# Patient Record
Sex: Female | Born: 1935 | Race: White | Hispanic: No | Smoking: Never smoker
Health system: Southern US, Community
[De-identification: ages and names within clinical notes are randomized; demographics above are authoritative.]

## PROBLEM LIST (undated history)

## (undated) DIAGNOSIS — I10 Essential (primary) hypertension: Secondary | ICD-10-CM

## (undated) DIAGNOSIS — T7840XA Allergy, unspecified, initial encounter: Secondary | ICD-10-CM

## (undated) DIAGNOSIS — I4891 Unspecified atrial fibrillation: Secondary | ICD-10-CM

## (undated) DIAGNOSIS — E079 Disorder of thyroid, unspecified: Secondary | ICD-10-CM

## (undated) DIAGNOSIS — H269 Unspecified cataract: Secondary | ICD-10-CM

## (undated) DIAGNOSIS — M1990 Unspecified osteoarthritis, unspecified site: Secondary | ICD-10-CM

## (undated) DIAGNOSIS — M4800 Spinal stenosis, site unspecified: Secondary | ICD-10-CM

## (undated) DIAGNOSIS — N181 Chronic kidney disease, stage 1: Secondary | ICD-10-CM

## (undated) DIAGNOSIS — R011 Cardiac murmur, unspecified: Secondary | ICD-10-CM

## (undated) DIAGNOSIS — S42402A Unspecified fracture of lower end of left humerus, initial encounter for closed fracture: Secondary | ICD-10-CM

## (undated) DIAGNOSIS — E785 Hyperlipidemia, unspecified: Secondary | ICD-10-CM

## (undated) HISTORY — DX: Hyperlipidemia, unspecified: E78.5

## (undated) HISTORY — DX: Cardiac murmur, unspecified: R01.1

## (undated) HISTORY — DX: Allergy, unspecified, initial encounter: T78.40XA

## (undated) HISTORY — DX: Spinal stenosis, site unspecified: M48.00

## (undated) HISTORY — DX: Unspecified cataract: H26.9

## (undated) HISTORY — DX: Essential (primary) hypertension: I10

## (undated) HISTORY — DX: Chronic kidney disease, stage 1: N18.1

## (undated) HISTORY — DX: Unspecified fracture of lower end of left humerus, initial encounter for closed fracture: S42.402A

## (undated) HISTORY — DX: Unspecified atrial fibrillation: I48.91

## (undated) HISTORY — PX: BILATERAL OOPHORECTOMY: SHX1221

## (undated) HISTORY — DX: Disorder of thyroid, unspecified: E07.9

## (undated) HISTORY — DX: Unspecified osteoarthritis, unspecified site: M19.90

## (undated) HISTORY — PX: JOINT REPLACEMENT: SHX530

---

## 1967-06-04 HISTORY — PX: APPENDECTOMY: SHX54

## 1967-09-02 HISTORY — PX: OVARIAN CYST REMOVAL: SHX89

## 1977-06-03 HISTORY — PX: ABDOMINAL HYSTERECTOMY: SHX81

## 1978-06-03 HISTORY — PX: BLADDER REPAIR: SHX76

## 1998-06-03 HISTORY — PX: ROTATOR CUFF REPAIR: SHX139

## 2007-06-04 HISTORY — PX: REPLACEMENT TOTAL KNEE BILATERAL: SUR1225

## 2008-10-01 LAB — HM MAMMOGRAPHY: HM Mammogram: NORMAL

## 2008-10-03 ENCOUNTER — Ambulatory Visit: Payer: Self-pay | Admitting: Internal Medicine

## 2008-10-05 ENCOUNTER — Ambulatory Visit: Payer: Self-pay | Admitting: Internal Medicine

## 2009-03-12 ENCOUNTER — Inpatient Hospital Stay: Payer: Self-pay | Admitting: Internal Medicine

## 2009-04-10 ENCOUNTER — Ambulatory Visit: Payer: Self-pay | Admitting: Internal Medicine

## 2009-10-31 ENCOUNTER — Ambulatory Visit: Payer: Self-pay | Admitting: Internal Medicine

## 2010-08-06 ENCOUNTER — Emergency Department: Payer: Self-pay | Admitting: Emergency Medicine

## 2010-10-22 ENCOUNTER — Ambulatory Visit: Payer: Self-pay | Admitting: Internal Medicine

## 2010-11-14 ENCOUNTER — Ambulatory Visit: Payer: Self-pay | Admitting: Internal Medicine

## 2010-11-26 ENCOUNTER — Ambulatory Visit: Payer: Self-pay | Admitting: Internal Medicine

## 2011-02-08 ENCOUNTER — Telehealth: Payer: Self-pay | Admitting: Internal Medicine

## 2011-02-08 NOTE — Telephone Encounter (Signed)
Pt had 6 weeks of pt can not sit 2 more hours at a time.  Pt would like to get a note to be exzempt from jury duty

## 2011-02-11 NOTE — Telephone Encounter (Signed)
Yes,  I do not have her records available to write her the letter that she needs; but will be happy to write it. Does she have a herniated disk? Or degenerative hip disease. ?

## 2011-02-11 NOTE — Telephone Encounter (Signed)
Patient calle Ana Cruz and stated she has had 6 weeks of PT and can not sit more than 2 hours at the time.   She wanted to know if you could exempt her from jury duty.  Please advise

## 2011-02-12 NOTE — Telephone Encounter (Signed)
Notified patient letter is ready to be picked up

## 2011-02-12 NOTE — Telephone Encounter (Signed)
Patient says that she has bulging disc. She would like to pick letter up when it is ready.

## 2011-02-12 NOTE — Telephone Encounter (Signed)
Letter is printed

## 2011-04-08 ENCOUNTER — Encounter: Payer: Self-pay | Admitting: Internal Medicine

## 2011-04-09 ENCOUNTER — Encounter: Payer: Self-pay | Admitting: Internal Medicine

## 2011-04-09 ENCOUNTER — Ambulatory Visit (INDEPENDENT_AMBULATORY_CARE_PROVIDER_SITE_OTHER): Payer: Medicare Other | Admitting: Internal Medicine

## 2011-04-09 DIAGNOSIS — M48061 Spinal stenosis, lumbar region without neurogenic claudication: Secondary | ICD-10-CM

## 2011-04-09 DIAGNOSIS — M545 Low back pain, unspecified: Secondary | ICD-10-CM

## 2011-04-09 DIAGNOSIS — Z23 Encounter for immunization: Secondary | ICD-10-CM

## 2011-04-09 DIAGNOSIS — E039 Hypothyroidism, unspecified: Secondary | ICD-10-CM

## 2011-04-09 DIAGNOSIS — Z124 Encounter for screening for malignant neoplasm of cervix: Secondary | ICD-10-CM

## 2011-04-09 DIAGNOSIS — Z13820 Encounter for screening for osteoporosis: Secondary | ICD-10-CM

## 2011-04-09 DIAGNOSIS — I10 Essential (primary) hypertension: Secondary | ICD-10-CM

## 2011-04-09 DIAGNOSIS — Z79899 Other long term (current) drug therapy: Secondary | ICD-10-CM

## 2011-04-09 DIAGNOSIS — M5430 Sciatica, unspecified side: Secondary | ICD-10-CM

## 2011-04-09 DIAGNOSIS — Z1239 Encounter for other screening for malignant neoplasm of breast: Secondary | ICD-10-CM

## 2011-04-09 DIAGNOSIS — Z1231 Encounter for screening mammogram for malignant neoplasm of breast: Secondary | ICD-10-CM | POA: Insufficient documentation

## 2011-04-09 DIAGNOSIS — Z7901 Long term (current) use of anticoagulants: Secondary | ICD-10-CM

## 2011-04-09 DIAGNOSIS — E785 Hyperlipidemia, unspecified: Secondary | ICD-10-CM

## 2011-04-09 LAB — COMPREHENSIVE METABOLIC PANEL
AST: 17 U/L (ref 0–37)
Albumin: 3.8 g/dL (ref 3.5–5.2)
Alkaline Phosphatase: 82 U/L (ref 39–117)
Potassium: 4 mEq/L (ref 3.5–5.1)
Sodium: 139 mEq/L (ref 135–145)
Total Bilirubin: 0.6 mg/dL (ref 0.3–1.2)
Total Protein: 7.4 g/dL (ref 6.0–8.3)

## 2011-04-09 LAB — LIPID PANEL
LDL Cholesterol: 106 mg/dL — ABNORMAL HIGH (ref 0–99)
Total CHOL/HDL Ratio: 3
Triglycerides: 71 mg/dL (ref 0.0–149.0)
VLDL: 14.2 mg/dL (ref 0.0–40.0)

## 2011-04-09 MED ORDER — TRAMADOL HCL 50 MG PO TABS: 50.0000 mg | ORAL_TABLET | Freq: Four times a day (QID) | ORAL | Status: DC | PRN

## 2011-04-09 MED ORDER — LEVOTHYROXINE SODIUM 88 MCG PO TABS: 88.0000 ug | ORAL_TABLET | Freq: Every day | ORAL | Status: DC

## 2011-04-09 MED ORDER — PRAVASTATIN SODIUM 10 MG PO TABS: 10.0000 mg | ORAL_TABLET | Freq: Every day | ORAL | Status: DC

## 2011-04-09 MED ORDER — LOSARTAN POTASSIUM-HCTZ 100-25 MG PO TABS: 1.0000 | ORAL_TABLET | Freq: Every day | ORAL | Status: DC

## 2011-04-09 NOTE — Progress Notes (Signed)
  Subjective:    Patient ID: Ana Cruz, female    DOB: Mar 15, 1934, 77 y.o.   MRN: [id number]  HPI Ana Cruz is a 77 yr old white female with a history of hypertension, paroxysmal  atrial fib, hyperlipdiemnia who is here for 6 month follow up on chronic medical issues.. She a recent episode of RVR in May which was resolved with an oral dose of cardizem, 30 mg. She recent saw her cardiologist,  Dr. Gwen Pounds  on oct 23rd for clearance for upcoming cataract surgery in December.  Preop Nov 16 .  Had an abnormal mammogram  Recently adn returned for additional films which were normal, she thinks the left breast.  Flu shot given at [facility].  Pneumovax in 2000. Received Tdap in April 2010  Shingles in 2008.    Her low back pain has imporved with 6 weeks of PT and she is not taking tramadol on a daily basis.   Review of Systems  Constitutional: Negative for fever, chills and unexpected weight change.  HENT: Negative for hearing loss, ear pain, nosebleeds, congestion, sore throat, facial swelling, rhinorrhea, sneezing, mouth sores, trouble swallowing, neck pain, neck stiffness, voice change, postnasal drip, sinus pressure, tinnitus and ear discharge.   Eyes: Negative for pain, discharge, redness and visual disturbance.  Respiratory: Negative for cough, chest tightness, shortness of breath, wheezing and stridor.   Cardiovascular: Negative for chest pain, palpitations and leg swelling.  Musculoskeletal: Negative for myalgias and arthralgias.  Skin: Negative for color change and rash.  Neurological: Negative for dizziness, weakness, light-headedness and headaches.  Hematological: Negative for adenopathy.       Objective:   Physical Exam  Constitutional: She is oriented to person, place, and time. She appears well-developed and well-nourished.  HENT:  Mouth/Throat: Oropharynx is clear and moist.  Eyes: EOM are normal. Pupils are equal, round, and reactive to light. No scleral icterus.  Neck:  Normal range of motion. Neck supple. No JVD present. No thyromegaly present.  Cardiovascular: Normal rate, regular rhythm, normal heart sounds and intact distal pulses.   Pulmonary/Chest: Effort normal and breath sounds normal.  Abdominal: Soft. Bowel sounds are normal. She exhibits no mass. There is no tenderness.  Musculoskeletal: Normal range of motion. She exhibits no edema.  Lymphadenopathy:    She has no cervical adenopathy.  Neurological: She is alert and oriented to person, place, and time.  Skin: Skin is warm and dry.  Psychiatric: She has a normal mood and affect.          Assessment & Plan:

## 2011-04-09 NOTE — Patient Instructions (Signed)
We are getting you set up for a bone density test and referral for massage therapy for your sciatica.  We will call you with the results of your cholesterol and thyroid tests and adjust meds as needed.

## 2011-04-10 ENCOUNTER — Encounter: Payer: Self-pay | Admitting: Internal Medicine

## 2011-04-10 DIAGNOSIS — N183 Chronic kidney disease, stage 3 unspecified: Secondary | ICD-10-CM | POA: Insufficient documentation

## 2011-04-10 DIAGNOSIS — N1831 Chronic kidney disease, stage 3a: Secondary | ICD-10-CM | POA: Insufficient documentation

## 2011-04-10 DIAGNOSIS — I10 Essential (primary) hypertension: Secondary | ICD-10-CM | POA: Insufficient documentation

## 2011-04-10 DIAGNOSIS — I480 Paroxysmal atrial fibrillation: Secondary | ICD-10-CM | POA: Insufficient documentation

## 2011-04-10 DIAGNOSIS — E785 Hyperlipidemia, unspecified: Secondary | ICD-10-CM | POA: Insufficient documentation

## 2011-04-10 DIAGNOSIS — Z124 Encounter for screening for malignant neoplasm of cervix: Secondary | ICD-10-CM | POA: Insufficient documentation

## 2011-04-10 DIAGNOSIS — M48061 Spinal stenosis, lumbar region without neurogenic claudication: Secondary | ICD-10-CM | POA: Insufficient documentation

## 2011-04-10 DIAGNOSIS — D631 Anemia in chronic kidney disease: Secondary | ICD-10-CM | POA: Insufficient documentation

## 2011-04-10 DIAGNOSIS — I4891 Unspecified atrial fibrillation: Secondary | ICD-10-CM | POA: Insufficient documentation

## 2011-04-10 NOTE — Assessment & Plan Note (Signed)
Well-controlled currently 

## 2011-04-10 NOTE — Assessment & Plan Note (Signed)
Secondary to L5 disk herniation.  Managed with PT and tramadol

## 2011-04-12 ENCOUNTER — Telehealth: Payer: Self-pay | Admitting: Internal Medicine

## 2011-04-12 NOTE — Telephone Encounter (Signed)
[phone] Pt would like lab results she had done this week.  And have you send the rx to [facility] Please advise when rx are sent

## 2011-04-12 NOTE — Telephone Encounter (Signed)
Left message asking patient to return my call.

## 2011-04-16 NOTE — Telephone Encounter (Signed)
Patient has already been notified of labs results.

## 2011-04-19 ENCOUNTER — Ambulatory Visit: Payer: Self-pay | Admitting: Ophthalmology

## 2011-04-22 ENCOUNTER — Telehealth: Payer: Self-pay | Admitting: Internal Medicine

## 2011-04-22 MED ORDER — LEVOTHYROXINE SODIUM 88 MCG PO TABS: 88.0000 ug | ORAL_TABLET | Freq: Every day | ORAL | Status: DC

## 2011-04-22 NOTE — Telephone Encounter (Signed)
Patient wants the name brand of the medication synthroid to be called into the pharmacy, she can't take the generic. She also wants a bone density and massage therapy referral put in she said it should have been done at the last visit.

## 2011-04-22 NOTE — Telephone Encounter (Signed)
Both of these referrals were made at her visit one week ago and are in process, but the massage therapy has to be scheduled and paid for by the patient.  She received a call last week from Marge to this effect, but Ana Cruz is calling patient back to review  sy nthroid called to pharmacy.

## 2011-05-06 ENCOUNTER — Ambulatory Visit: Payer: Self-pay | Admitting: Ophthalmology

## 2011-05-15 ENCOUNTER — Ambulatory Visit: Payer: Self-pay | Admitting: Internal Medicine

## 2011-05-20 ENCOUNTER — Ambulatory Visit: Payer: Self-pay | Admitting: Ophthalmology

## 2011-05-22 ENCOUNTER — Telehealth: Payer: Self-pay | Admitting: Internal Medicine

## 2011-05-22 DIAGNOSIS — M8580 Other specified disorders of bone density and structure, unspecified site: Secondary | ICD-10-CM | POA: Insufficient documentation

## 2011-05-22 NOTE — Telephone Encounter (Signed)
Left message on machine at home for patient to return call. 

## 2011-05-22 NOTE — Telephone Encounter (Signed)
Received a triage note from Nurse on call OTW about UTI symptoms..  Did she get treated by Urgent Care ?  Also received her DEXA scan results and she has worsening osteopenia compared to 2010 results. .  Will discuss at next visit

## 2011-05-22 NOTE — Telephone Encounter (Signed)
Patient advised as instructed via telephone.  She went to an Urgent Care and was treated for a UTI, she finished the antibiotics yesterday and is feeling better.  Advised of dexa results.

## 2011-06-11 ENCOUNTER — Ambulatory Visit: Payer: Self-pay | Admitting: Ophthalmology

## 2011-06-17 ENCOUNTER — Encounter: Payer: Self-pay | Admitting: Internal Medicine

## 2011-07-09 ENCOUNTER — Encounter: Payer: Self-pay | Admitting: Internal Medicine

## 2011-10-07 ENCOUNTER — Encounter: Payer: Self-pay | Admitting: Internal Medicine

## 2011-10-07 ENCOUNTER — Telehealth: Payer: Self-pay | Admitting: Internal Medicine

## 2011-10-07 ENCOUNTER — Ambulatory Visit (INDEPENDENT_AMBULATORY_CARE_PROVIDER_SITE_OTHER): Payer: Medicare Other | Admitting: Internal Medicine

## 2011-10-07 VITALS — BP 124/70 | HR 63 | Temp 97.9°F | Resp 16 | Wt 167.2 lb

## 2011-10-07 DIAGNOSIS — N390 Urinary tract infection, site not specified: Secondary | ICD-10-CM | POA: Insufficient documentation

## 2011-10-07 DIAGNOSIS — M899 Disorder of bone, unspecified: Secondary | ICD-10-CM

## 2011-10-07 DIAGNOSIS — Z79899 Other long term (current) drug therapy: Secondary | ICD-10-CM

## 2011-10-07 DIAGNOSIS — M949 Disorder of cartilage, unspecified: Secondary | ICD-10-CM

## 2011-10-07 DIAGNOSIS — M8580 Other specified disorders of bone density and structure, unspecified site: Secondary | ICD-10-CM

## 2011-10-07 DIAGNOSIS — E785 Hyperlipidemia, unspecified: Secondary | ICD-10-CM

## 2011-10-07 DIAGNOSIS — E039 Hypothyroidism, unspecified: Secondary | ICD-10-CM

## 2011-10-07 DIAGNOSIS — I4891 Unspecified atrial fibrillation: Secondary | ICD-10-CM

## 2011-10-07 DIAGNOSIS — Z9849 Cataract extraction status, unspecified eye: Secondary | ICD-10-CM | POA: Insufficient documentation

## 2011-10-07 LAB — HM MAMMOGRAPHY: HM Mammogram: NORMAL

## 2011-10-07 MED ORDER — LOSARTAN POTASSIUM-HCTZ 100-25 MG PO TABS: 1.0000 | ORAL_TABLET | Freq: Every day | ORAL | Status: DC

## 2011-10-07 MED ORDER — LEVOTHYROXINE SODIUM 88 MCG PO TABS: 88.0000 ug | ORAL_TABLET | Freq: Every day | ORAL | Status: DC

## 2011-10-07 MED ORDER — PRAVASTATIN SODIUM 10 MG PO TABS: 10.0000 mg | ORAL_TABLET | Freq: Every day | ORAL | Status: DC

## 2011-10-07 MED ORDER — SULFAMETHOXAZOLE-TRIMETHOPRIM 800-160 MG PO TABS: 1.0000 | ORAL_TABLET | Freq: Two times a day (BID) | ORAL | Status: AC

## 2011-10-07 MED ORDER — ALENDRONATE SODIUM 70 MG PO TABS: 70.0000 mg | ORAL_TABLET | ORAL | Status: DC

## 2011-10-07 NOTE — Assessment & Plan Note (Signed)
Well controlled by November labs on maximal dose of pravastatin.  .  Will repeat this month

## 2011-10-07 NOTE — Progress Notes (Signed)
Patient ID: Ana Cruz, female   DOB: July 01, 1933, 78 y.o.   MRN: [id number]  Patient Active Problem List  Diagnoses  . Screening for breast cancer  . Chronic kidney disease, stage I  . Hypertension  . Atrial fibrillation  . Hyperlipidemia  . Spinal stenosis of lumbar region  . Screening for cervical cancer  . Osteopenia  . Cataract extraction status of eye  . Urinary tract infection    Subjective:  CC:   Chief Complaint  Patient presents with  . Follow-up    HPI:   Ana Cruz a 78 y.o. female who presents for 6 month follow up on hypertension, atrial fibrillation and osteopenia.  She was treated for UTI twice, once in Dec and Jan.  She is going to [place] next week for 3 weeks for her 56th wedding anniversary and worried about recurrent UTI and not sleeping on the flight.  Finally, she had a repeat DEXA scan recently and her osteopenia has worsened without treatment. And she is here to discuss therapy.   Past Medical History  Diagnosis Date  . Spinal stenosis     has L5 disk herniation with nerve root displacement  . Elbow fracture, left     repaired Jan 2000  . Atrial fibrillation   . hypothyroidism     medicated since age 45, no prior surgery  . Chronic kidney disease, stage I     mild, did improve with cessation of NSAIDs  . Hypertension   . allergic rhinitis   . Hyperlipidemia     Past Surgical History  Procedure Date  . Rotator cuff repair Jan 2000  . Ovarian cyst removal April 1969  . Bladder repair 1980  . Rotator cuff repair 2000  . Replacement total knee bilateral 2009  . Abdominal hysterectomy 1979  . Appendectomy 1969         The following portions of the patient's history were reviewed and updated as appropriate: Allergies, current medications, and problem list.    Review of Systems:   12 Pt  review of systems was negative except those addressed in the HPI,     History   Social History  . Marital Status: Married      Spouse Name: N/A    Number of Children: N/A  . Years of Education: N/A   Occupational History  . Not on file.   Social History Main Topics  . Smoking status: Never Smoker   . Smokeless tobacco: Never Used  . Alcohol Use: Yes  . Drug Use: No  . Sexually Active: Not on file   Other Topics Concern  . Not on file   Social History Narrative  . No narrative on file    Objective:  BP 124/70  Pulse 63  Temp(Src) 97.9 F (36.6 C) (Oral)  Resp 16  Wt 167 lb 4 oz (75.864 kg)  SpO2 97%  General appearance: alert, cooperative and appears stated age Ears: normal TM's and external ear canals both ears Throat: lips, mucosa, and tongue normal; teeth and gums normal Neck: no adenopathy, no carotid bruit, supple, symmetrical, trachea midline and thyroid not enlarged, symmetric, no tenderness/mass/nodules Back: symmetric, no curvature. ROM normal. No CVA tenderness. Lungs: clear to auscultation bilaterally Heart: regular rate and rhythm, S1, S2 normal, no murmur, click, rub or gallop Abdomen: soft, non-tender; bowel sounds normal; no masses,  no organomegaly Pulses: 2+ and symmetric Skin: Skin color, texture, turgor normal. No rashes or lesions Lymph nodes: Cervical, supraclavicular, and axillary  nodes normal.  Assessment and Plan:  Cataract extraction status of eye Both eyres done by Porfil[facility],  Vision improved .   Osteopenia Discussed fosomax and evista.  Decided to go with alendronate given her history of atrial fibrillation  Hyperlipidemia Well controlled by November labs on maximal dose of pravastatin.  .  Will repeat this month  Atrial fibrillation Managed with diltiazem and aspirin   Urinary tract infection She has a prlonged flight coming up ,  Will give her rx for Septra to take with her to [place]    Updated Medication List Outpatient Encounter Prescriptions as of 10/07/2011  Medication Sig Dispense Refill  . aspirin 81 MG tablet Take 81 mg by mouth daily.         . Cholecalciferol (VITAMIN D3) 1000 UNITS CAPS Take by mouth.        . clindamycin (CLEOCIN) 300 MG capsule Take 300 mg by mouth daily. Only dental visits       . diltiazem (CARDIZEM) 30 MG tablet Take 30 mg by mouth daily as needed.        [name] levothyroxine (SYNTHROID, LEVOTHROID) 88 MCG tablet Take 1 tablet (88 mcg total) by mouth daily. Brand name only  90 tablet  2  . losartan-hydrochlorothiazide (HYZAAR) 100-25 MG per tablet Take 1 tablet by mouth daily.  90 tablet  3  . niacin 500 MG CR capsule Take 500 mg by mouth at bedtime.        . pravastatin (PRAVACHOL) 10 MG tablet Take 1 tablet (10 mg total) by mouth daily.  90 tablet  3  . traMADol (ULTRAM) 50 MG tablet Take 1 tablet (50 mg total) by mouth every 6 (six) hours as needed. Maximum dose= 8 tablets per day  360 tablet  1  . DISCONTD: levothyroxine (SYNTHROID, LEVOTHROID) 88 MCG tablet Take 1 tablet (88 mcg total) by mouth daily.  90 tablet  2  . DISCONTD: levothyroxine (SYNTHROID, LEVOTHROID) 88 MCG tablet Take 1 tablet (88 mcg total) by mouth daily. Brand name only  90 tablet  2  . DISCONTD: losartan-hydrochlorothiazide (HYZAAR) 100-25 MG per tablet Take 1 tablet by mouth daily.  90 tablet  3  . DISCONTD: losartan-hydrochlorothiazide (HYZAAR) 100-25 MG per tablet Take 1 tablet by mouth daily.  90 tablet  3  . DISCONTD: pravastatin (PRAVACHOL) 10 MG tablet Take 1 tablet (10 mg total) by mouth daily.  90 tablet  3  . DISCONTD: pravastatin (PRAVACHOL) 10 MG tablet Take 1 tablet (10 mg total) by mouth daily.  90 tablet  3  . alendronate (FOSAMAX) 70 MG tablet Take 1 tablet (70 mg total) by mouth every 7 (seven) days. Take with a full glass of water on an empty stomach.  4 tablet  11  . sulfamethoxazole-trimethoprim (SEPTRA DS) 800-160 MG per tablet Take 1 tablet by mouth 2 (two) times daily.  5 tablet  0  . DISCONTD: levothyroxine (SYNTHROID) 88 MCG tablet Take 1 tablet (88 mcg total) by mouth daily.  30 tablet  11     Orders Placed This  Encounter  Procedures  . TSH  . Lipid panel  . COMPLETE METABOLIC PANEL WITH GFR    No Follow-up on file.

## 2011-10-07 NOTE — Assessment & Plan Note (Signed)
She has a prlonged flight coming up ,  Will give her rx for Septra to take with her to [place]

## 2011-10-07 NOTE — Telephone Encounter (Signed)
Patient called and stated she tried to get into MyChart but her social security number was wrong.  Can you please call patient and correct the number.  Thanks!

## 2011-10-07 NOTE — Assessment & Plan Note (Signed)
Managed with diltiazem and aspirin

## 2011-10-07 NOTE — Patient Instructions (Signed)
You may use the medicine for anxiety that I gave you earlier for your plane trip to [facility]  Your bones are getting thinner,  I am recommending that your start medication. Start the alendronate (generic for fosamax, start when you return from [place]): once a week, taken on an empty stomach , remain sitting up,  No food or nonwater beverage for 40 minutes   I am giving you a prescription for Septra in case you get a urinary tract infection on your trip

## 2011-10-07 NOTE — Assessment & Plan Note (Signed)
Both eyres done by Porfilio,  Vision improved .

## 2011-10-07 NOTE — Assessment & Plan Note (Addendum)
Discussed fosomax and evista.  Decided to go with alendronate given her history of atrial fibrillation

## 2011-10-08 NOTE — Telephone Encounter (Signed)
Correct social security number and put in correct email address Pt aware of corrections

## 2011-10-31 ENCOUNTER — Telehealth: Payer: Self-pay | Admitting: Internal Medicine

## 2011-10-31 DIAGNOSIS — Z1239 Encounter for other screening for malignant neoplasm of breast: Secondary | ICD-10-CM

## 2011-10-31 NOTE — Telephone Encounter (Signed)
Pt came in with letter from [place] stated its time for her annual screening mammogram Needs order Around 8:30  No tuesdays Not Wednesday 6/12

## 2011-12-04 ENCOUNTER — Ambulatory Visit: Payer: Self-pay | Admitting: Internal Medicine

## 2011-12-19 ENCOUNTER — Encounter: Payer: Self-pay | Admitting: Internal Medicine

## 2012-01-14 ENCOUNTER — Other Ambulatory Visit: Payer: Self-pay | Admitting: Internal Medicine

## 2012-01-14 MED ORDER — ALENDRONATE SODIUM 70 MG PO TABS: 70.0000 mg | ORAL_TABLET | ORAL | Status: DC

## 2012-04-08 ENCOUNTER — Encounter: Payer: Self-pay | Admitting: Internal Medicine

## 2012-04-08 ENCOUNTER — Ambulatory Visit (INDEPENDENT_AMBULATORY_CARE_PROVIDER_SITE_OTHER): Payer: Medicare Other | Admitting: Internal Medicine

## 2012-04-08 ENCOUNTER — Ambulatory Visit: Payer: Self-pay | Admitting: Internal Medicine

## 2012-04-08 VITALS — BP 120/68 | HR 61 | Temp 97.9°F | Resp 12 | Ht 64.0 in | Wt 172.8 lb

## 2012-04-08 DIAGNOSIS — I10 Essential (primary) hypertension: Secondary | ICD-10-CM

## 2012-04-08 DIAGNOSIS — G8929 Other chronic pain: Secondary | ICD-10-CM

## 2012-04-08 DIAGNOSIS — E039 Hypothyroidism, unspecified: Secondary | ICD-10-CM

## 2012-04-08 DIAGNOSIS — M25511 Pain in right shoulder: Secondary | ICD-10-CM

## 2012-04-08 DIAGNOSIS — Z79899 Other long term (current) drug therapy: Secondary | ICD-10-CM

## 2012-04-08 DIAGNOSIS — Z1211 Encounter for screening for malignant neoplasm of colon: Secondary | ICD-10-CM

## 2012-04-08 DIAGNOSIS — E785 Hyperlipidemia, unspecified: Secondary | ICD-10-CM

## 2012-04-08 DIAGNOSIS — M25519 Pain in unspecified shoulder: Secondary | ICD-10-CM

## 2012-04-08 DIAGNOSIS — I4891 Unspecified atrial fibrillation: Secondary | ICD-10-CM

## 2012-04-08 LAB — COMPREHENSIVE METABOLIC PANEL
ALT: 14 U/L (ref 0–35)
AST: 16 U/L (ref 0–37)
Albumin: 3.6 g/dL (ref 3.5–5.2)
Calcium: 8.8 mg/dL (ref 8.4–10.5)
Chloride: 107 mEq/L (ref 96–112)
Creatinine, Ser: 1 mg/dL (ref 0.4–1.2)
Potassium: 4.4 mEq/L (ref 3.5–5.1)

## 2012-04-08 NOTE — Patient Instructions (Addendum)
Referral to Dr Juanell Fairly at [facility] underway  We will change your thyroid medication to generic pending lab results.

## 2012-04-08 NOTE — Progress Notes (Signed)
Patient ID: Ana Cruz, female   DOB: July 18, 1933, 78 y.o.   MRN: [id number] Patient Active Problem List  Diagnosis  . Screening for breast cancer  . Chronic kidney disease, stage I  . Hypertension  . Atrial fibrillation  . Hyperlipidemia  . Spinal stenosis of lumbar region  . Screening for cervical cancer  . Osteopenia  . Cataract extraction status of eye  . Chronic left shoulder pain    Subjective:  CC:   Chief Complaint  Patient presents with  . Follow-up    stomach    HPI:   Ana Cruz a 78 y.o. female who presents 6 month follow up on atrial fibrillation, hyperlipidemia, hypothyroidism and hypertension.  She has a new complaint of right shoulder pain,  which is episodic and stabbing in nature and has been occurring for the last 3 months. She has a history of rotator cuff surgery 13 years ago requiring hardware. The pain that she is currently having is brief and stabbing and quite painful. It resolves spontaneously. It is not brought on by exercise but she has noted deep pain that limits her abduction to 180. The stabbing pain  does not occur during aerobics.  She has noted no loss of strength or fine  motor skills.  The pain does not even occur weekly.  He is not having concurrent neck pain or stiffness.  She carries her shoulder blade on the opposite side.    Past Medical History  Diagnosis Date  . Spinal stenosis     has L5 disk herniation with nerve root displacement  . Elbow fracture, left     repaired Jan 2000  . Atrial fibrillation   . hypothyroidism     medicated since age 85, no prior surgery  . Chronic kidney disease, stage I     mild, did improve with cessation of NSAIDs  . Hypertension   . allergic rhinitis   . Hyperlipidemia     Past Surgical History  Procedure Date  . Rotator cuff repair Jan 2000  . Ovarian cyst removal April 1969  . Bladder repair 1980  . Rotator cuff repair 2000  . Replacement total knee bilateral 2009  .  Abdominal hysterectomy 1979  . Appendectomy 1969  . Bilateral oophorectomy     squential         The following portions of the patient's history were reviewed and updated as appropriate: Allergies, current medications, and problem list.    Review of Systems:   12 Pt  review of systems was negative except those addressed in the HPI,     History   Social History  . Marital Status: Married    Spouse Name: N/A    Number of Children: N/A  . Years of Education: N/A   Occupational History  . Not on file.   Social History Main Topics  . Smoking status: Never Smoker   . Smokeless tobacco: Never Used  . Alcohol Use: Yes  . Drug Use: No  . Sexually Active: Not on file   Other Topics Concern  . Not on file   Social History Narrative  . No narrative on file    Objective:  BP 120/68  Pulse 61  Temp 97.9 F (36.6 C) (Oral)  Resp 12  Ht 5\' 4"  (1.626 m)  Wt 172 lb 12 oz (78.359 kg)  BMI 29.65 kg/m2  SpO2 98%  General appearance: alert, cooperative and appears stated age Neck: no adenopathy, no carotid bruit, supple, symmetrical,  trachea midline and thyroid not enlarged, symmetric, no tenderness/mass/nodules Back: symmetric, no curvature. ROM normal. No CVA tenderness. Lungs: clear to auscultation bilaterally Heart: regular rate and rhythm, S1, S2 normal, no murmur, click, rub or gallop Abdomen: soft, non-tender; bowel sounds normal; no masses,  no organomegaly Pulses: 2+ and symmetric Skin: Skin color, texture, turgor normal. No rashes or lesions Lymph nodes: Cervical, supraclavicular, and axillary nodes normal. MSK: left shoulder limited ROM to 180 of abduction .  No point tenderness,  Extremely limited internal rotation   Assessment and Plan:  Atrial fibrillation Rate controlled currently. No changes today.  Hyperlipidemia Current LDL is 104. Liver function tests are normal. Continue current statin dose.  Hypertension Well-controlled on current regimen.  Renal function is stable. No changes today.  Chronic left shoulder pain Etiology unclear. Plain films were done today to rule out fracture,  dislocation and loosening hardware. No evidence of any this was found. If her pain continues we will have to refer her for orthopedic evaluation and/or MRI.   Updated Medication List Outpatient Encounter Prescriptions as of 04/08/2012  Medication Sig Dispense Refill  . alendronate (FOSAMAX) 70 MG tablet Take 1 tablet (70 mg total) by mouth every 7 (seven) days. Take with a full glass of water on an empty stomach.  12 tablet  5  . aspirin 81 MG tablet Take 81 mg by mouth daily.        . Biotin 2500 MCG CAPS Take by mouth daily.      . clindamycin (CLEOCIN) 300 MG capsule Take 300 mg by mouth daily. Only dental visits       . diltiazem (CARDIZEM) 30 MG tablet Take 30 mg by mouth daily as needed.        [name] levothyroxine (SYNTHROID, LEVOTHROID) 88 MCG tablet Take 1 tablet (88 mcg total) by mouth daily. Brand name only  90 tablet  2  . losartan (COZAAR) 50 MG tablet Take 50 mg by mouth daily.      . niacin 500 MG CR capsule Take 500 mg by mouth at bedtime.        . pravastatin (PRAVACHOL) 10 MG tablet Take 1 tablet (10 mg total) by mouth daily.  90 tablet  3  . traMADol (ULTRAM) 50 MG tablet Take 1 tablet (50 mg total) by mouth every 6 (six) hours as needed. Maximum dose= 8 tablets per day  360 tablet  1  . [DISCONTINUED] losartan-hydrochlorothiazide (HYZAAR) 100-25 MG per tablet Take 1 tablet by mouth daily.  90 tablet  3  . Cholecalciferol (VITAMIN D3) 1000 UNITS CAPS Take by mouth.           Orders Placed This Encounter  Procedures  . DG Shoulder Right  . HM MAMMOGRAPHY  . Comprehensive metabolic panel  . TSH  . LDL cholesterol, direct  . Ambulatory referral to Orthopedic Surgery  . HM COLONOSCOPY    Return in about 3 months (around 07/09/2012).

## 2012-04-09 ENCOUNTER — Encounter: Payer: Self-pay | Admitting: Internal Medicine

## 2012-04-09 ENCOUNTER — Telehealth: Payer: Self-pay | Admitting: Internal Medicine

## 2012-04-09 DIAGNOSIS — M7531 Calcific tendinitis of right shoulder: Secondary | ICD-10-CM | POA: Insufficient documentation

## 2012-04-09 NOTE — Telephone Encounter (Signed)
Right shoulder x-rays showed no evidence of dislocation fracture or hardware that had moved. If She would like to try physical therapy or  orthopedic referral  I'm happy to refer.

## 2012-04-09 NOTE — Assessment & Plan Note (Signed)
Etiology unclear. Plain films were done today to rule out fracture dislocation and loosening hardware. No evidence of any this was found. If her pain continues we will have to refer her for orthopedic evaluation and MRI.

## 2012-04-09 NOTE — Telephone Encounter (Signed)
And her cholesterol thyroid liver and kidney function were all fine.

## 2012-04-09 NOTE — Assessment & Plan Note (Signed)
Current LDL is 104. Liver function tests are normal. Continue current statin dose.

## 2012-04-09 NOTE — Assessment & Plan Note (Signed)
Rate controlled currently. No changes today.

## 2012-04-09 NOTE — Assessment & Plan Note (Signed)
Well-controlled on current regimen. Renal function is stable. No changes today.

## 2012-04-10 NOTE — Telephone Encounter (Signed)
Spoke to patient gave her lab results as directed. And she has a referral already.

## 2012-04-15 ENCOUNTER — Other Ambulatory Visit: Payer: Self-pay | Admitting: Internal Medicine

## 2012-04-15 ENCOUNTER — Telehealth: Payer: Self-pay | Admitting: Internal Medicine

## 2012-04-15 NOTE — Telephone Encounter (Signed)
Pt called stating she need refill on thyroid meds.  Pt would like it to be generic [facility] Pt would like 90 day supply

## 2012-04-16 ENCOUNTER — Other Ambulatory Visit: Payer: Self-pay

## 2012-04-16 MED ORDER — LEVOTHYROXINE SODIUM 88 MCG PO TABS: 88.0000 ug | ORAL_TABLET | Freq: Every day | ORAL | Status: DC

## 2012-04-16 NOTE — Telephone Encounter (Signed)
Refill request for Levothyroxine 88 mg #90 2 R sent electronic to [facility]

## 2012-04-20 ENCOUNTER — Other Ambulatory Visit: Payer: Self-pay

## 2012-04-24 ENCOUNTER — Encounter: Payer: Self-pay | Admitting: Internal Medicine

## 2012-05-26 ENCOUNTER — Emergency Department: Payer: Self-pay | Admitting: Emergency Medicine

## 2012-05-26 LAB — RAPID INFLUENZA A&B ANTIGENS

## 2012-06-05 ENCOUNTER — Encounter: Payer: Self-pay | Admitting: Internal Medicine

## 2012-06-05 ENCOUNTER — Ambulatory Visit (INDEPENDENT_AMBULATORY_CARE_PROVIDER_SITE_OTHER): Payer: Medicare Other | Admitting: Internal Medicine

## 2012-06-05 ENCOUNTER — Other Ambulatory Visit: Payer: Self-pay | Admitting: Internal Medicine

## 2012-06-05 VITALS — BP 130/62 | HR 78 | Temp 98.2°F | Resp 16 | Wt 171.0 lb

## 2012-06-05 DIAGNOSIS — J329 Chronic sinusitis, unspecified: Secondary | ICD-10-CM

## 2012-06-05 DIAGNOSIS — A499 Bacterial infection, unspecified: Secondary | ICD-10-CM

## 2012-06-05 DIAGNOSIS — J019 Acute sinusitis, unspecified: Secondary | ICD-10-CM

## 2012-06-05 DIAGNOSIS — R05 Cough: Secondary | ICD-10-CM

## 2012-06-05 DIAGNOSIS — B9689 Other specified bacterial agents as the cause of diseases classified elsewhere: Secondary | ICD-10-CM

## 2012-06-05 DIAGNOSIS — R059 Cough, unspecified: Secondary | ICD-10-CM

## 2012-06-05 MED ORDER — BENZONATATE 200 MG PO CAPS: 200.0000 mg | ORAL_CAPSULE | Freq: Three times a day (TID) | ORAL | Status: DC | PRN

## 2012-06-05 MED ORDER — LEVOFLOXACIN 500 MG PO TABS: 500.0000 mg | ORAL_TABLET | Freq: Every day | ORAL | Status: DC

## 2012-06-05 MED ORDER — LEVOTHYROXINE SODIUM 88 MCG PO TABS: 88.0000 ug | ORAL_TABLET | Freq: Every day | ORAL | Status: DC

## 2012-06-05 MED ORDER — METHYLPREDNISOLONE ACETATE 40 MG/ML IJ SUSP
40.0000 mg | Freq: Once | INTRAMUSCULAR | Status: AC
  Administered 2012-06-05: 40 mg via INTRAMUSCULAR

## 2012-06-05 MED ORDER — PREDNISONE (PAK) 10 MG PO TABS: ORAL_TABLET | ORAL | Status: DC

## 2012-06-05 MED ORDER — PRAVASTATIN SODIUM 10 MG PO TABS: 10.0000 mg | ORAL_TABLET | Freq: Every day | ORAL | Status: DC

## 2012-06-05 MED ORDER — LOSARTAN POTASSIUM 50 MG PO TABS: 50.0000 mg | ORAL_TABLET | Freq: Every day | ORAL | Status: DC

## 2012-06-05 MED ORDER — ALENDRONATE SODIUM 70 MG PO TABS: 70.0000 mg | ORAL_TABLET | ORAL | Status: DC

## 2012-06-05 NOTE — Telephone Encounter (Addendum)
Refill on:   Alendronate   Pravastatin  Losartan   Levothyroxine   Paper in [facility] box pharmacy is requesting Strength, directions, quantity and refills.

## 2012-06-05 NOTE — Patient Instructions (Addendum)
You have a sinus/ear infection   .  I am prescribing an antibiotic (levaquin ) and prednisone taper  To manage the infection and the inflammation in your ear/sinuses.   Start the Levaquin tonight but start the prednisone tomorrow.  We fave you a shot of prednisone today to get you started.   I also advise use of the following OTC meds to help with your other symptoms.   Take generic OTC benadryl (dipenhydramine) 25 mg every 8 hours for the drainage,  Sudafed PE  10 to 30 mg every 8 hours for the congestion, you may substitute Afrin nasal spray for the nighttime dose of sudafed PE  If needed to prevent insomnia.(use the afrin after your simply salline flush)  flushes your sinuses twice daily with Simply Saline (do over the sink because if you do it right you will spit out globs of mucus)  Use benzonatate capsules FOR THE COUGH.  Gargle with salt water as needed for sore throat.

## 2012-06-05 NOTE — Progress Notes (Signed)
Patient ID: Ana Cruz, female   DOB: 08/20/1933, 79 y.o.   MRN: [id number]   Patient Active Problem List  Diagnosis  . Screening for breast cancer  . Chronic kidney disease, stage I  . Hypertension  . Atrial fibrillation  . Hyperlipidemia  . Spinal stenosis of lumbar region  . Screening for cervical cancer  . Osteopenia  . Cataract extraction status of eye  . Chronic left shoulder pain  . Acute sinusitis treated with antibiotics in the past 60 days    Subjective:  CC:   Chief Complaint  Patient presents with  . Cough  . Bronchitis    not improving  . Sinusitis    green mucus    HPI:   MIIA BLANKS a 79 y.o. female who presents with Signs and symptoms consistent with sinusitis and otitis.  Patient was evaluated and treated in the [place] ER on Christmas Eve (10 days ago)  for cough, congestion and dyspnea . flu swab negative, CXR was negative for infiltrates. She was treated for bronchitis with Septra, PROAir and a cough suppressant. She has finished the antibiotics but continues to have purulent blood-streaked nasal drainage accompanied by moderate pressure in both years and aching of teeth on both sides of her head. No wheezing or shortness of breath. Cough still present. She's had no recent travel. In fact her holiday plans for travel to [place] where preempted by her infection.  Her husband has had similar symptoms and was treated by urgent care.   Past Medical History  Diagnosis Date  . Spinal stenosis     has L5 disk herniation with nerve root displacement  . Elbow fracture, left     repaired Jan 2000  . Atrial fibrillation   . hypothyroidism     medicated since age 79, no prior surgery  . Chronic kidney disease, stage I     mild, did improve with cessation of NSAIDs  . Hypertension   . allergic rhinitis   . Hyperlipidemia     Past Surgical History  Procedure Date  . Rotator cuff repair Jan 2000  . Ovarian cyst removal April 1969  .  Bladder repair 1980  . Rotator cuff repair 2000  . Replacement total knee bilateral 2009  . Abdominal hysterectomy 1979  . Appendectomy 1969  . Bilateral oophorectomy     squential         The following portions of the patient's history were reviewed and updated as appropriate: Allergies, current medications, and problem list.    Review of Systems:  Patient denies  unintentional weight loss, skin rash, eye pain,  sore throat, dysphagia,  hemoptysis ,  dyspnea, wheezing, chest pain, palpitations, orthopnea, edema, abdominal pain, nausea, melena, diarrhea, constipation, flank pain, dysuria, hematuria, urinary  Frequency, nocturia, numbness, tingling, seizures,  Focal weakness, Loss of consciousness,  Tremor, insomnia, depression, anxiety, and suicidal ideation.        History   Social History  . Marital Status: Married    Spouse Name: N/A    Number of Children: N/A  . Years of Education: N/A   Occupational History  . Not on file.   Social History Main Topics  . Smoking status: Never Smoker   . Smokeless tobacco: Never Used  . Alcohol Use: Yes  . Drug Use: No  . Sexually Active: Not on file   Other Topics Concern  . Not on file   Social History Narrative  . No narrative on file  Objective:  BP 130/62  Pulse 78  Temp 98.2 F (36.8 C) (Oral)  Resp 16  Wt 171 lb (77.565 kg)  SpO2 96%  General appearance: alert, cooperative and appears stated age Ears: normal TM's and external ear canals both ears Throat: lips, mucosa, and tongue normal; teeth and gums normal Neck: no adenopathy, no carotid bruit, supple, symmetrical, trachea midline and thyroid not enlarged, symmetric, no tenderness/mass/nodules Back: symmetric, no curvature. ROM normal. No CVA tenderness. Lungs: clear to auscultation bilaterally Heart: regular rate and rhythm, S1, S2 normal, no murmur, click, rub or gallop Abdomen: soft, non-tender; bowel sounds normal; no masses,  no  organomegaly Pulses: 2+ and symmetric Skin: Skin color, texture, turgor normal. No rashes or lesions Lymph nodes: Cervical, supraclavicular, and axillary nodes normal.  Assessment and Plan:  Acute sinusitis treated with antibiotics in the past 60 days Her symptoms have failed to resolve despite treatment for community acquired MRSA with Septra by the ER physician and she has signs of early otitis.. Changing antibiotics today to Levaquin adding prednisone for inflammation, and Tessalon pearls for cough.   Updated Medication List Outpatient Encounter Prescriptions as of 06/05/2012  Medication Sig Dispense Refill  . alendronate (FOSAMAX) 70 MG tablet TAKE 1 TABLET EVERY 7 DAYS--TAKE WITH FULL GLASS OF WATER ON AN EMPTY STOMACH  12 tablet  3  . aspirin 81 MG tablet Take 81 mg by mouth daily.        . Biotin 2500 MCG CAPS Take by mouth daily.      . Cholecalciferol (VITAMIN D3) 1000 UNITS CAPS Take by mouth.        . niacin 500 MG CR capsule Take 500 mg by mouth at bedtime.        . traMADol (ULTRAM) 50 MG tablet Take 1 tablet (50 mg total) by mouth every 6 (six) hours as needed. Maximum dose= 8 tablets per day  360 tablet  1  . [DISCONTINUED] alendronate (FOSAMAX) 70 MG tablet Take 1 tablet (70 mg total) by mouth every 7 (seven) days. Take with a full glass of water on an empty stomach.  12 tablet  5  . [DISCONTINUED] levothyroxine (SYNTHROID, LEVOTHROID) 88 MCG tablet Take 1 tablet (88 mcg total) by mouth daily. Generic  name only  90 tablet  2  . [DISCONTINUED] losartan (COZAAR) 50 MG tablet Take 50 mg by mouth daily.      . [DISCONTINUED] pravastatin (PRAVACHOL) 10 MG tablet Take 1 tablet (10 mg total) by mouth daily.  90 tablet  3  . benzonatate (TESSALON) 200 MG capsule Take 1 capsule (200 mg total) by mouth 3 (three) times daily as needed for cough.  30 capsule  0  . clindamycin (CLEOCIN) 300 MG capsule Take 300 mg by mouth daily. Only dental visits       . diltiazem (CARDIZEM) 30 MG tablet  Take 30 mg by mouth daily as needed.        [name] levofloxacin (LEVAQUIN) 500 MG tablet Take 1 tablet (500 mg total) by mouth daily.  7 tablet  0  . predniSONE (STERAPRED UNI-PAK) 10 MG tablet 6 tablets on Day 1 , then reduce by 1 tablet daily until gone  21 tablet  0  . [EXPIRED] methylPREDNISolone acetate (DEPO-MEDROL) injection 40 mg          No orders of the defined types were placed in this encounter.    No Follow-up on file.

## 2012-06-05 NOTE — Telephone Encounter (Signed)
MEDICATIONS E-SCRIBED TO [facility],  ALENDRONATE, PRAVASTATIN, LOSARTAN, LEVOTHYROXINE.

## 2012-06-06 ENCOUNTER — Encounter: Payer: Self-pay | Admitting: Internal Medicine

## 2012-06-06 DIAGNOSIS — B9689 Other specified bacterial agents as the cause of diseases classified elsewhere: Secondary | ICD-10-CM | POA: Insufficient documentation

## 2012-06-06 DIAGNOSIS — J019 Acute sinusitis, unspecified: Secondary | ICD-10-CM | POA: Insufficient documentation

## 2012-06-06 NOTE — Assessment & Plan Note (Addendum)
Her symptoms have failed to resolve despite treatment for community acquired MRSA with Septra by the ER physician and she has signs of early otitis.. Changing antibiotics today to Levaquin adding prednisone for inflammation, and Tessalon pearls for cough.

## 2012-06-11 ENCOUNTER — Other Ambulatory Visit: Payer: Self-pay | Admitting: Internal Medicine

## 2012-06-11 NOTE — Telephone Encounter (Signed)
meds refaxed today.

## 2012-06-11 NOTE — Telephone Encounter (Signed)
[facility] received a new prescription for the patient's levothyroxine (SYNTHROID, LEVOTHROID) 88 MCG tablet  pravastatin (PRAVACHOL) 10 MG tablet  losartan (COZAAR) 50 MG tablet  The strength is missing .   Forms are on your desk.

## 2012-07-28 ENCOUNTER — Encounter: Payer: Self-pay | Admitting: Internal Medicine

## 2012-07-28 ENCOUNTER — Telehealth: Payer: Self-pay | Admitting: *Deleted

## 2012-07-28 ENCOUNTER — Ambulatory Visit (INDEPENDENT_AMBULATORY_CARE_PROVIDER_SITE_OTHER): Payer: Medicare Other | Admitting: Internal Medicine

## 2012-07-28 VITALS — BP 126/66 | HR 61 | Temp 97.5°F | Resp 16 | Ht 63.0 in | Wt 171.0 lb

## 2012-07-28 DIAGNOSIS — E785 Hyperlipidemia, unspecified: Secondary | ICD-10-CM

## 2012-07-28 DIAGNOSIS — E559 Vitamin D deficiency, unspecified: Secondary | ICD-10-CM

## 2012-07-28 DIAGNOSIS — E039 Hypothyroidism, unspecified: Secondary | ICD-10-CM

## 2012-07-28 DIAGNOSIS — R7989 Other specified abnormal findings of blood chemistry: Secondary | ICD-10-CM

## 2012-07-28 DIAGNOSIS — Z79899 Other long term (current) drug therapy: Secondary | ICD-10-CM

## 2012-07-28 DIAGNOSIS — I10 Essential (primary) hypertension: Secondary | ICD-10-CM

## 2012-07-28 DIAGNOSIS — M48061 Spinal stenosis, lumbar region without neurogenic claudication: Secondary | ICD-10-CM

## 2012-07-28 DIAGNOSIS — Z124 Encounter for screening for malignant neoplasm of cervix: Secondary | ICD-10-CM

## 2012-07-28 DIAGNOSIS — Z9849 Cataract extraction status, unspecified eye: Secondary | ICD-10-CM

## 2012-07-28 DIAGNOSIS — Z0000 Encounter for general adult medical examination without abnormal findings: Secondary | ICD-10-CM

## 2012-07-28 DIAGNOSIS — N181 Chronic kidney disease, stage 1: Secondary | ICD-10-CM

## 2012-07-28 DIAGNOSIS — Z1211 Encounter for screening for malignant neoplasm of colon: Secondary | ICD-10-CM

## 2012-07-28 DIAGNOSIS — N390 Urinary tract infection, site not specified: Secondary | ICD-10-CM

## 2012-07-28 LAB — POCT URINALYSIS DIPSTICK
Glucose, UA: NEGATIVE
Leukocytes, UA: NEGATIVE
Nitrite, UA: NEGATIVE
Protein, UA: NEGATIVE
Spec Grav, UA: 1.025
Urobilinogen, UA: 0.2

## 2012-07-28 LAB — COMPREHENSIVE METABOLIC PANEL
AST: 22 U/L (ref 0–37)
Albumin: 4 g/dL (ref 3.5–5.2)
BUN: 35 mg/dL — ABNORMAL HIGH (ref 6–23)
Calcium: 8.8 mg/dL (ref 8.4–10.5)
Chloride: 105 mEq/L (ref 96–112)
Creatinine, Ser: 1.3 mg/dL — ABNORMAL HIGH (ref 0.4–1.2)
GFR: 42.21 mL/min — ABNORMAL LOW (ref >60.00)
Glucose, Bld: 82 mg/dL (ref 70–99)
Potassium: 4.1 mEq/L (ref 3.5–5.1)

## 2012-07-28 LAB — TSH: TSH: 0.42 u[IU]/mL (ref 0.35–5.50)

## 2012-07-28 LAB — LIPID PANEL
Cholesterol: 213 mg/dL — ABNORMAL HIGH (ref 0–200)
VLDL: 16.6 mg/dL (ref 0.0–40.0)

## 2012-07-28 LAB — LDL CHOLESTEROL, DIRECT: Direct LDL: 118.3 mg/dL

## 2012-07-28 MED ORDER — MELOXICAM 15 MG PO TABS: 15.0000 mg | ORAL_TABLET | Freq: Every day | ORAL | Status: DC

## 2012-07-28 MED ORDER — SULFAMETHOXAZOLE-TRIMETHOPRIM 800-160 MG PO TABS: 1.0000 | ORAL_TABLET | Freq: Two times a day (BID) | ORAL | Status: DC

## 2012-07-28 NOTE — Assessment & Plan Note (Signed)
Annual comprehensive exam was done including breast, pelvic  smear. All screenings have been addressed .

## 2012-07-28 NOTE — Telephone Encounter (Signed)
No necessary,  Urinalysis was normal. Thanks!

## 2012-07-28 NOTE — Patient Instructions (Addendum)
Your pelvic exam shows that your vagina is atrophic and may be tricking you into thinking you have a UTI at times. The next time you have symptoms,  Please collect a urine and bring it to [place] BEFORE you start the antibiotic.  You can compare your results with ours.    The take home stool cards will screen for blood in your stools.   The meloxicam is for arthritis pain and can be combined with tramadol or tylenol but NOT ADVIL OR ALEVE  We will call or send you the results of your new labs  Please return in 6 months  Call when you need your mammogram ordered

## 2012-07-28 NOTE — Assessment & Plan Note (Addendum)
Well controlled on current regimen. Renal function has bumped a bit, which may be due to setpra use vs meloxicam use vs fasting state.  Will have patient return  In a week.

## 2012-07-28 NOTE — Assessment & Plan Note (Signed)
Slight bump,  May be due to resuming meloxicam

## 2012-07-28 NOTE — Assessment & Plan Note (Signed)
Well controlled on current regimen. Liver enzymes are normal, no changes today.

## 2012-07-28 NOTE — Progress Notes (Signed)
Patient ID: Ana Cruz, female   DOB: Jul 07, 1933, 79 y.o.   MRN: [id number]   The patient is here for annual Medicare wellness examination and management of other chronic and acute problems.     The risk factors are reflected in the social history.  The roster of all physicians providing medical care to patient - is listed in the Snapshot section of the chart.  Activities of daily living:  The patient is 100% independent in all ADLs: dressing, toileting, feeding as well as independent mobility  Home safety : The patient has smoke detectors in the home. They wear seatbelts.  There are no firearms at home. There is no violence in the home.   There is no risks for hepatitis, STDs or HIV. There is no   history of blood transfusion. They have no travel history to infectious disease endemic areas of the world.  The patient has seen their dentist in the last six month. They have seen their eye doctor in the last year. They admit to slight hearing difficulty with regard to whispered voices and some television programs.  They have deferred audiologic testing in the last year.  They do not  have excessive sun exposure. Discussed the need for sun protection: hats, long sleeves and use of sunscreen if there is significant sun exposure.   Diet: the importance of a healthy diet is discussed. They do have a healthy diet.  The benefits of regular aerobic exercise were discussed. She walks 4 times per week ,  20 minutes.   Depression screen: there are no signs or vegative symptoms of depression- irritability, change in appetite, anhedonia, sadness/tearfullness.  Cognitive assessment: the patient manages all their financial and personal affairs and is actively engaged. They could relate day,date,year and events; recalled 2/3 objects at 3 minutes; performed clock-face test normally.  The following portions of the patient's history were reviewed and updated as appropriate: allergies, current  medications, past family history, past medical history,  past surgical history, past social history  and problem list.  Visual acuity was not assessed per patient preference since she has regular follow up with her ophthalmologist. Hearing and body mass index were assessed and reviewed.   During the course of the visit the patient was educated and counseled about appropriate screening and preventive services including : fall prevention , diabetes screening, nutrition counseling, colorectal cancer screening, and recommended immunizations.    Objective  BP 126/66  Pulse 61  Temp(Src) 97.5 F (36.4 C) (Oral)  Resp 16  Ht 5\' 3"  (1.6 m)  Wt 171 lb (77.565 kg)  BMI 30.3 kg/m2  SpO2 97%  General Appearance:    Alert, cooperative, no distress, appears stated age  Head:    Normocephalic, without obvious abnormality, atraumatic  Eyes:    PERRL, conjunctiva/corneas clear, EOM's intact, fundi    benign, both eyes  Ears:    Normal TM's and external ear canals, both ears  Nose:   Nares normal, septum midline, mucosa normal, no drainage    or sinus tenderness  Throat:   Lips, mucosa, and tongue normal; teeth and gums normal  Neck:   Supple, symmetrical, trachea midline, no adenopathy;    thyroid:  no enlargement/tenderness/nodules; no carotid   bruit or JVD  Back:     Symmetric, no curvature, ROM normal, no CVA tenderness  Lungs:     Clear to auscultation bilaterally, respirations unlabored  Chest Wall:    No tenderness or deformity   Heart:  Regular rate and rhythm, S1 and S2 normal, no murmur, rub   or gallop  Breast Exam:    No tenderness, masses, or nipple abnormality  Abdomen:     Soft, non-tender, bowel sounds active all four quadrants,    no masses, no organomegaly  Genitalia:    Pelvic: cervix normal in appearance, external genitalia normal, no adnexal masses or tenderness, no cervical motion tenderness, rectovaginal septum normal, uterus normal size, shape, and consistency and vagina  normal without discharge  Extremities:   Extremities normal, atraumatic, no cyanosis or edema  Pulses:   2+ and symmetric all extremities  Skin:   Skin color, texture, turgor normal, no rashes or lesions  Lymph nodes:   Cervical, supraclavicular, and axillary nodes normal  Neurologic:   CNII-XII intact, normal strength, sensation and reflexes    throughout    Assessment and Plan  Routine general medical examination at a health care facility Annual comprehensive exam was done including breast, pelvic  smear. All screenings have been addressed .   Hypertension Well controlled on current regimen. Renal function has bumped a bit, which may be due to setpra use vs meloxicam use vs fasting state.  Will have patient return  In a week.   Hyperlipidemia Well controlled on current regimen. Liver enzymes are normal, no changes today.  Chronic kidney disease, stage I Slight bump,  May be due to resuming meloxicam   Updated Medication List Outpatient Encounter Prescriptions as of 07/28/2012  Medication Sig Dispense Refill  . alendronate (FOSAMAX) 70 MG tablet Take 1 tablet (70 mg total) by mouth every 7 (seven) days. Take with a full glass of water on an empty stomach.  12 tablet  5  . aspirin 81 MG tablet Take 81 mg by mouth daily.        . Biotin 2500 MCG CAPS Take by mouth daily.      . Cholecalciferol (VITAMIN D3) 1000 UNITS CAPS Take by mouth.        . diltiazem (CARDIZEM) 30 MG tablet Take 30 mg by mouth daily as needed.        [name] levothyroxine (SYNTHROID, LEVOTHROID) 88 MCG tablet Take 1 tablet (88 mcg total) by mouth daily. Generic  name only  90 tablet  2  . losartan (COZAAR) 50 MG tablet Take 1 tablet (50 mg total) by mouth daily.  90 tablet  3  . niacin 500 MG CR capsule Take 500 mg by mouth daily.       . pravastatin (PRAVACHOL) 10 MG tablet Take 1 tablet (10 mg total) by mouth daily.  90 tablet  3  . sulfamethoxazole-trimethoprim (BACTRIM DS,SEPTRA DS) 800-160 MG per tablet Take 1  tablet by mouth 2 (two) times daily.      . traMADol (ULTRAM) 50 MG tablet Take 1 tablet (50 mg total) by mouth every 6 (six) hours as needed. Maximum dose= 8 tablets per day  360 tablet  1  . clindamycin (CLEOCIN) 300 MG capsule Take 300 mg by mouth daily. Only dental visits       . meloxicam (MOBIC) 15 MG tablet Take 1 tablet (15 mg total) by mouth daily.  30 tablet  0  . sulfamethoxazole-trimethoprim (SEPTRA DS) 800-160 MG per tablet Take 1 tablet by mouth 2 (two) times daily.  14 tablet  0  . [DISCONTINUED] alendronate (FOSAMAX) 70 MG tablet TAKE 1 TABLET EVERY 7 DAYS--TAKE WITH FULL GLASS OF WATER ON AN EMPTY STOMACH  12 tablet  3  . [  DISCONTINUED] benzonatate (TESSALON) 200 MG capsule Take 1 capsule (200 mg total) by mouth 3 (three) times daily as needed for cough.  30 capsule  0  . [DISCONTINUED] levofloxacin (LEVAQUIN) 500 MG tablet Take 1 tablet (500 mg total) by mouth daily.  7 tablet  0  . [DISCONTINUED] predniSONE (STERAPRED UNI-PAK) 10 MG tablet 6 tablets on Day 1 , then reduce by 1 tablet daily until gone  21 tablet  0   No facility-administered encounter medications on file as of 07/28/2012.

## 2012-07-28 NOTE — Telephone Encounter (Signed)
Would you like a urine culture done   

## 2012-07-29 ENCOUNTER — Encounter: Payer: Self-pay | Admitting: Internal Medicine

## 2012-07-29 NOTE — Addendum Note (Signed)
Addended by: Sherlene Shams on: 07/29/2012 06:45 AM   Modules accepted: Orders

## 2012-08-11 ENCOUNTER — Other Ambulatory Visit (INDEPENDENT_AMBULATORY_CARE_PROVIDER_SITE_OTHER): Payer: Medicare Other

## 2012-08-11 ENCOUNTER — Other Ambulatory Visit: Payer: Medicare Other

## 2012-08-11 DIAGNOSIS — E785 Hyperlipidemia, unspecified: Secondary | ICD-10-CM

## 2012-08-11 DIAGNOSIS — R7989 Other specified abnormal findings of blood chemistry: Secondary | ICD-10-CM

## 2012-08-11 LAB — BASIC METABOLIC PANEL
BUN: 35 mg/dL — ABNORMAL HIGH (ref 6–23)
Calcium: 8.8 mg/dL (ref 8.4–10.5)
Chloride: 108 mEq/L (ref 96–112)
Creatinine, Ser: 1.2 mg/dL (ref 0.4–1.2)
GFR: 48.62 mL/min — ABNORMAL LOW (ref >60.00)

## 2012-08-11 LAB — LIPID PANEL
Cholesterol: 170 mg/dL (ref 0–200)
LDL Cholesterol: 89 mg/dL (ref 0–99)
Triglycerides: 61 mg/dL (ref 0.0–149.0)

## 2012-08-12 ENCOUNTER — Encounter: Payer: Self-pay | Admitting: Internal Medicine

## 2012-08-12 NOTE — Addendum Note (Signed)
Addended by: Sherlene Shams on: 08/12/2012 01:30 PM   Modules accepted: Orders

## 2012-08-19 ENCOUNTER — Other Ambulatory Visit: Payer: Self-pay | Admitting: Internal Medicine

## 2012-08-19 ENCOUNTER — Other Ambulatory Visit (INDEPENDENT_AMBULATORY_CARE_PROVIDER_SITE_OTHER): Payer: Medicare Other

## 2012-08-19 DIAGNOSIS — Z1211 Encounter for screening for malignant neoplasm of colon: Secondary | ICD-10-CM

## 2012-08-19 LAB — FECAL OCCULT BLOOD, IMMUNOCHEMICAL: Fecal Occult Bld: NEGATIVE

## 2012-08-20 ENCOUNTER — Encounter: Payer: Self-pay | Admitting: Internal Medicine

## 2012-09-08 ENCOUNTER — Encounter: Payer: Self-pay | Admitting: Internal Medicine

## 2012-09-08 ENCOUNTER — Other Ambulatory Visit (INDEPENDENT_AMBULATORY_CARE_PROVIDER_SITE_OTHER): Payer: Medicare Other

## 2012-09-08 DIAGNOSIS — R7989 Other specified abnormal findings of blood chemistry: Secondary | ICD-10-CM

## 2012-09-08 LAB — BASIC METABOLIC PANEL
BUN: 28 mg/dL — ABNORMAL HIGH (ref 6–23)
Calcium: 8.8 mg/dL (ref 8.4–10.5)
Creatinine, Ser: 1 mg/dL (ref 0.4–1.2)
GFR: 57.79 mL/min — ABNORMAL LOW (ref >60.00)

## 2012-09-09 ENCOUNTER — Encounter: Payer: Self-pay | Admitting: Internal Medicine

## 2012-09-22 ENCOUNTER — Other Ambulatory Visit: Payer: Self-pay | Admitting: Internal Medicine

## 2012-09-22 DIAGNOSIS — M48061 Spinal stenosis, lumbar region without neurogenic claudication: Secondary | ICD-10-CM

## 2012-09-22 NOTE — Telephone Encounter (Signed)
Pt requesting 90 day supply of Meloxicam to [facility]. A previous note recommended 7.5 mg, rather than the 15mg . Ok to refill, at what dose?

## 2012-09-23 MED ORDER — MELOXICAM 15 MG PO TABS: 15.0000 mg | ORAL_TABLET | Freq: Every day | ORAL | Status: DC

## 2012-11-10 ENCOUNTER — Encounter: Payer: Self-pay | Admitting: Internal Medicine

## 2012-11-17 ENCOUNTER — Other Ambulatory Visit: Payer: Self-pay | Admitting: Internal Medicine

## 2012-12-07 ENCOUNTER — Ambulatory Visit: Payer: Self-pay | Admitting: Internal Medicine

## 2012-12-16 DIAGNOSIS — Z8679 Personal history of other diseases of the circulatory system: Secondary | ICD-10-CM | POA: Insufficient documentation

## 2012-12-19 LAB — HM MAMMOGRAPHY: HM Mammogram: NORMAL

## 2012-12-25 ENCOUNTER — Encounter: Payer: Self-pay | Admitting: Internal Medicine

## 2013-01-07 DIAGNOSIS — M2021 Hallux rigidus, right foot: Secondary | ICD-10-CM | POA: Insufficient documentation

## 2013-01-19 ENCOUNTER — Telehealth: Payer: Self-pay | Admitting: Internal Medicine

## 2013-01-19 ENCOUNTER — Encounter: Payer: Self-pay | Admitting: Internal Medicine

## 2013-01-19 ENCOUNTER — Ambulatory Visit (INDEPENDENT_AMBULATORY_CARE_PROVIDER_SITE_OTHER): Payer: Medicare Other | Admitting: Internal Medicine

## 2013-01-19 VITALS — BP 150/68 | HR 61 | Temp 98.2°F | Resp 14 | Wt 176.5 lb

## 2013-01-19 DIAGNOSIS — M899 Disorder of bone, unspecified: Secondary | ICD-10-CM

## 2013-01-19 DIAGNOSIS — M8580 Other specified disorders of bone density and structure, unspecified site: Secondary | ICD-10-CM

## 2013-01-19 DIAGNOSIS — E785 Hyperlipidemia, unspecified: Secondary | ICD-10-CM

## 2013-01-19 DIAGNOSIS — Z881 Allergy status to other antibiotic agents status: Secondary | ICD-10-CM | POA: Insufficient documentation

## 2013-01-19 DIAGNOSIS — E039 Hypothyroidism, unspecified: Secondary | ICD-10-CM | POA: Insufficient documentation

## 2013-01-19 DIAGNOSIS — Z79899 Other long term (current) drug therapy: Secondary | ICD-10-CM

## 2013-01-19 DIAGNOSIS — Z888 Allergy status to other drugs, medicaments and biological substances status: Secondary | ICD-10-CM

## 2013-01-19 DIAGNOSIS — I10 Essential (primary) hypertension: Secondary | ICD-10-CM

## 2013-01-19 DIAGNOSIS — T7840XA Allergy, unspecified, initial encounter: Secondary | ICD-10-CM

## 2013-01-19 DIAGNOSIS — Z1211 Encounter for screening for malignant neoplasm of colon: Secondary | ICD-10-CM

## 2013-01-19 DIAGNOSIS — E559 Vitamin D deficiency, unspecified: Secondary | ICD-10-CM

## 2013-01-19 LAB — LIPID PANEL
Cholesterol: 214 mg/dL — ABNORMAL HIGH (ref 0–200)
HDL: 79.4 mg/dL (ref >39.00)
VLDL: 15.6 mg/dL (ref 0.0–40.0)

## 2013-01-19 LAB — COMPREHENSIVE METABOLIC PANEL
AST: 19 U/L (ref 0–37)
Alkaline Phosphatase: 68 U/L (ref 39–117)
BUN: 32 mg/dL — ABNORMAL HIGH (ref 6–23)
Calcium: 9.5 mg/dL (ref 8.4–10.5)
Creatinine, Ser: 1.2 mg/dL (ref 0.4–1.2)
Total Bilirubin: 0.6 mg/dL (ref 0.3–1.2)

## 2013-01-19 LAB — TSH: TSH: 0.87 u[IU]/mL (ref 0.35–5.50)

## 2013-01-19 MED ORDER — BLOOD PRESSURE CUFF MISC: Status: DC

## 2013-01-19 MED ORDER — AMOXICILLIN 500 MG PO CAPS: ORAL_CAPSULE | ORAL | Status: DC

## 2013-01-19 MED ORDER — METHYLPREDNISOLONE ACETATE 40 MG/ML IJ SUSP
40.0000 mg | Freq: Once | INTRAMUSCULAR | Status: AC
  Administered 2013-01-19: 40 mg via INTRAMUSCULAR

## 2013-01-19 NOTE — Assessment & Plan Note (Addendum)
By repeat DEXA testing December 2012.  Alendronate started. No symptoms of intolerance.. Continue calcium,  check vitamin D level and regular exercise.

## 2013-01-19 NOTE — Patient Instructions (Addendum)
You had an allergic reaction to clindamycin   You received a shot of Depo Medrol today to prevent any recurrence  You can use benadryl as needed for itching   Your future pre dental antibiotic will be amoxicillin 2000 mg  ( 4 500 mg tablets one hour prior to procedure)

## 2013-01-19 NOTE — Telephone Encounter (Signed)
Pt is needing rx for meter she discussed this at today's visit

## 2013-01-19 NOTE — Assessment & Plan Note (Signed)
Given her persistent symptoms after using Benadryl yesterday I have given her a shot of steroids today, 40 mg Depo-Medrol. Continue to use Benadryl as needed. Clindamycin has been added to her list of allergies.

## 2013-01-19 NOTE — Progress Notes (Signed)
Patient ID: Ana Cruz, female   DOB: 09-Mar-1934, 79 y.o.   MRN: [id number]  Patient Active Problem List   Diagnosis Date Noted  . Screening for colon cancer 01/19/2013  . Unspecified hypothyroidism 01/19/2013  . Drug allergy, antibiotic 01/19/2013  . Routine general medical examination at a health care facility 07/28/2012  . Chronic left shoulder pain 04/09/2012  . Cataract extraction status of eye 10/07/2011  . Osteopenia 05/22/2011  . Spinal stenosis of lumbar region 04/10/2011  . Screening for cervical cancer 04/10/2011  . Chronic kidney disease, stage I   . Hypertension   . Atrial fibrillation   . Hyperlipidemia   . Screening for breast cancer 04/09/2011    Subjective:  CC:   Chief Complaint  Patient presents with  . Follow-up    6 month    HPI:   Ana Cruz a 79 y.o. female who presents for 6 month Follow up on chronic conditions including hypertension osteoarthritis mild renal insufficiency and obesity. She has been checking her blood pressures at home and they have been consistently under 140 with most readings in the 120s over 70s. She's been using meloxicam every other day for management of arthritis with out GI side effects or elevations in blood pressure.  She has been having a scratchy throat and tongue swelling since yesterday when she took the clindamycin prior to dental procedure. She has bilateral knee replacements and has to use pre-procedure antibiotics and the clindamycin that she has been taking for years caused a reaction where she developed swelling of the back of the tongue and scratchy throat. It is interesting to note that the clindamycin had expired 2 years ago. Her dentist went on with this procedure gave her 50 mg of Benadryl which helped somewhat. However she continues to have symptoms today.   She's not having a trouble swallowing or breathing     Past Medical History  Diagnosis Date  . Spinal stenosis     has L5 disk  herniation with nerve root displacement  . Elbow fracture, left     repaired Jan 2000  . Atrial fibrillation   . hypothyroidism     medicated since age 85, no prior surgery  . Chronic kidney disease, stage I     mild, did improve with cessation of NSAIDs  . Hypertension   . allergic rhinitis   . Hyperlipidemia     Past Surgical History  Procedure Laterality Date  . Rotator cuff repair  Jan 2000  . Ovarian cyst removal  April 1969  . Bladder repair  1980  . Rotator cuff repair  2000  . Replacement total knee bilateral  2009  . Abdominal hysterectomy  1979  . Appendectomy  1969  . Bilateral oophorectomy      squential       The following portions of the patient's history were reviewed and updated as appropriate: Allergies, current medications, and problem list.    Review of Systems:   12 Pt  review of systems was negative except those addressed in the HPI,     History   Social History  . Marital Status: Married    Spouse Name: N/A    Number of Children: N/A  . Years of Education: N/A   Occupational History  . Not on file.   Social History Main Topics  . Smoking status: Never Smoker   . Smokeless tobacco: Never Used  . Alcohol Use: Yes  . Drug Use: No  . Sexual Activity:  Not on file   Other Topics Concern  . Not on file   Social History Narrative  . No narrative on file    Objective:  Filed Vitals:   01/19/13 0953  BP: 150/68  Pulse: 61  Temp: 98.2 F (36.8 C)  Resp: 14     General appearance: alert, cooperative and appears stated age Ears: normal TM's and external ear canals both ears Throat: lips, mucosa, and tongue normal; teeth and gums normal Neck: no adenopathy, no carotid bruit, supple, symmetrical, trachea midline and thyroid not enlarged, symmetric, no tenderness/mass/nodules Back: symmetric, no curvature. ROM normal. No CVA tenderness. Lungs: clear to auscultation bilaterally Heart: regular rate and rhythm, S1, S2 normal, no  murmur, click, rub or gallop Abdomen: soft, non-tender; bowel sounds normal; no masses,  no organomegaly Pulses: 2+ and symmetric Skin: Skin color, texture, turgor normal. No rashes or lesions Lymph nodes: Cervical, supraclavicular, and axillary nodes normal.  Assessment and Plan:  Hypertension Elevated today.  Have asked patient to recheck bp at home a minimum of 5 times over the next 4 weeks and call readings to office for adjustment of medications.    Osteopenia By repeat DEXA testing December 2012.  Alendronate started. No symptoms of intolerance.. Continue calcium,  check vitamin D level and regular exercise.  Hyperlipidemia Managed with low-dose pravastatin goal LDL less than 100.  Unspecified hypothyroidism TSH is due for repeat testing prior to refill.  Drug allergy, antibiotic Given her persistent symptoms after using Benadryl yesterday I have given her a shot of steroids today, 40 mg Depo-Medrol. Continue to use Benadryl as needed. Clindamycin has been added to her list of allergies.   Updated Medication List Outpatient Encounter Prescriptions as of 01/19/2013  Medication Sig Dispense Refill  . alendronate (FOSAMAX) 70 MG tablet Take 1 tablet (70 mg total) by mouth every 7 (seven) days. Take with a full glass of water on an empty stomach.  12 tablet  5  . aspirin 81 MG tablet Take 81 mg by mouth daily.        . Biotin 2500 MCG CAPS Take by mouth daily.      . Cholecalciferol (VITAMIN D3) 1000 UNITS CAPS Take by mouth.        . clindamycin (CLEOCIN) 300 MG capsule Take 300 mg by mouth daily. Only dental visits       . levothyroxine (SYNTHROID, LEVOTHROID) 88 MCG tablet TAKE 1 TABLET EVERY DAY  90 tablet  2  . losartan (COZAAR) 50 MG tablet Take 1 tablet (50 mg total) by mouth daily.  90 tablet  3  . meloxicam (MOBIC) 15 MG tablet Take 7.5 mg by mouth every other day.      . niacin 500 MG CR capsule Take 500 mg by mouth daily.       . pravastatin (PRAVACHOL) 10 MG tablet  Take 1 tablet (10 mg total) by mouth daily.  90 tablet  3  . traMADol (ULTRAM) 50 MG tablet Take 1 tablet (50 mg total) by mouth every 6 (six) hours as needed. Maximum dose= 8 tablets per day  360 tablet  1  . [DISCONTINUED] meloxicam (MOBIC) 15 MG tablet Take 1 tablet (15 mg total) by mouth daily.  90 tablet  3  . amoxicillin (AMOXIL) 500 MG capsule 4 tablets 1 hour prior to procedure  4 capsule  1  . Blood Pressure Monitoring (BLOOD PRESSURE CUFF) MISC Large size for [employer] Automatic BP monitoring kit  1 each  0  .  diltiazem (CARDIZEM) 30 MG tablet Take 30 mg by mouth daily as needed.        . [DISCONTINUED] sulfamethoxazole-trimethoprim (BACTRIM DS,SEPTRA DS) 800-160 MG per tablet Take 1 tablet by mouth 2 (two) times daily.      . [DISCONTINUED] sulfamethoxazole-trimethoprim (SEPTRA DS) 800-160 MG per tablet Take 1 tablet by mouth 2 (two) times daily.  14 tablet  0  . [EXPIRED] methylPREDNISolone acetate (DEPO-MEDROL) injection 40 mg        No facility-administered encounter medications on file as of 01/19/2013.     Orders Placed This Encounter  Procedures  . [facility]  . Fecal Occult Blood, Guaiac  . Comprehensive metabolic panel  . TSH  . Lipid panel  . Vit D25 hydroxy(osteoporosis monitoring)    No Follow-up on file.

## 2013-01-19 NOTE — Assessment & Plan Note (Signed)
Managed with low-dose pravastatin goal LDL less than 100.

## 2013-01-19 NOTE — Telephone Encounter (Signed)
Left message for patient to return call verify which pharmacy to send script.

## 2013-01-19 NOTE — Assessment & Plan Note (Signed)
TSH is due for repeat testing prior to refill.

## 2013-01-19 NOTE — Assessment & Plan Note (Signed)
Elevated today. Have asked patient to recheck bp at home a minimum of 5 times over the next 4 weeks and call readings to office for adjustment of medications.     

## 2013-01-20 ENCOUNTER — Encounter: Payer: Self-pay | Admitting: Internal Medicine

## 2013-01-20 MED ORDER — PRAVASTATIN SODIUM 10 MG PO TABS: 10.0000 mg | ORAL_TABLET | Freq: Every day | ORAL | Status: DC

## 2013-01-20 MED ORDER — LOSARTAN POTASSIUM 50 MG PO TABS: 50.0000 mg | ORAL_TABLET | Freq: Every day | ORAL | Status: DC

## 2013-01-20 MED ORDER — LEVOTHYROXINE SODIUM 88 MCG PO TABS: ORAL_TABLET | ORAL | Status: DC

## 2013-01-20 NOTE — Addendum Note (Signed)
Addended by: Sherlene Shams on: 01/20/2013 07:01 AM   Modules accepted: Orders

## 2013-01-21 ENCOUNTER — Other Ambulatory Visit: Payer: Self-pay | Admitting: *Deleted

## 2013-01-21 MED ORDER — DILTIAZEM HCL 30 MG PO TABS: 30.0000 mg | ORAL_TABLET | Freq: Every day | ORAL | Status: DC | PRN

## 2013-01-21 NOTE — Telephone Encounter (Signed)
Left message for patient to notify of which pharmacy she would like order sent to .

## 2013-01-21 NOTE — Telephone Encounter (Signed)
Script sent to [facility] as patient requested.

## 2013-03-26 ENCOUNTER — Encounter: Payer: Self-pay | Admitting: Internal Medicine

## 2013-03-26 DIAGNOSIS — Z79899 Other long term (current) drug therapy: Secondary | ICD-10-CM

## 2013-03-26 DIAGNOSIS — E039 Hypothyroidism, unspecified: Secondary | ICD-10-CM

## 2013-03-28 NOTE — Telephone Encounter (Signed)
No office visit needed.  Just a labs visit

## 2013-04-08 ENCOUNTER — Other Ambulatory Visit: Payer: Self-pay

## 2013-05-14 ENCOUNTER — Other Ambulatory Visit: Payer: Self-pay | Admitting: Internal Medicine

## 2013-06-05 ENCOUNTER — Other Ambulatory Visit: Payer: Self-pay | Admitting: Internal Medicine

## 2013-07-22 ENCOUNTER — Ambulatory Visit: Payer: Medicare Other | Admitting: Internal Medicine

## 2013-08-02 ENCOUNTER — Ambulatory Visit (INDEPENDENT_AMBULATORY_CARE_PROVIDER_SITE_OTHER): Payer: Medicare Other | Admitting: Internal Medicine

## 2013-08-02 ENCOUNTER — Encounter: Payer: Self-pay | Admitting: Internal Medicine

## 2013-08-02 VITALS — BP 148/64 | HR 57 | Temp 98.4°F | Resp 18 | Wt 179.5 lb

## 2013-08-02 DIAGNOSIS — Z0000 Encounter for general adult medical examination without abnormal findings: Secondary | ICD-10-CM

## 2013-08-02 DIAGNOSIS — Z23 Encounter for immunization: Secondary | ICD-10-CM

## 2013-08-02 DIAGNOSIS — R5383 Other fatigue: Principal | ICD-10-CM

## 2013-08-02 DIAGNOSIS — Z1239 Encounter for other screening for malignant neoplasm of breast: Secondary | ICD-10-CM

## 2013-08-02 DIAGNOSIS — M949 Disorder of cartilage, unspecified: Secondary | ICD-10-CM

## 2013-08-02 DIAGNOSIS — E663 Overweight: Secondary | ICD-10-CM | POA: Insufficient documentation

## 2013-08-02 DIAGNOSIS — E785 Hyperlipidemia, unspecified: Secondary | ICD-10-CM

## 2013-08-02 DIAGNOSIS — R5381 Other malaise: Secondary | ICD-10-CM

## 2013-08-02 DIAGNOSIS — Z1211 Encounter for screening for malignant neoplasm of colon: Secondary | ICD-10-CM

## 2013-08-02 DIAGNOSIS — M8580 Other specified disorders of bone density and structure, unspecified site: Secondary | ICD-10-CM

## 2013-08-02 DIAGNOSIS — E669 Obesity, unspecified: Secondary | ICD-10-CM

## 2013-08-02 DIAGNOSIS — R635 Abnormal weight gain: Secondary | ICD-10-CM

## 2013-08-02 DIAGNOSIS — E559 Vitamin D deficiency, unspecified: Secondary | ICD-10-CM

## 2013-08-02 DIAGNOSIS — M899 Disorder of bone, unspecified: Secondary | ICD-10-CM

## 2013-08-02 LAB — CBC WITH DIFFERENTIAL/PLATELET
BASOS ABS: 0 10*3/uL (ref 0.0–0.1)
Basophils Relative: 0.2 % (ref 0.0–3.0)
EOS PCT: 4.2 % (ref 0.0–5.0)
Eosinophils Absolute: 0.2 10*3/uL (ref 0.0–0.7)
HEMATOCRIT: 38.3 % (ref 36.0–46.0)
HEMOGLOBIN: 12.4 g/dL (ref 12.0–15.0)
LYMPHS ABS: 1.4 10*3/uL (ref 0.7–4.0)
Lymphocytes Relative: 24.6 % (ref 12.0–46.0)
MCHC: 32.4 g/dL (ref 30.0–36.0)
MCV: 94.6 fl (ref 78.0–100.0)
Monocytes Absolute: 0.4 10*3/uL (ref 0.1–1.0)
Monocytes Relative: 6.9 % (ref 3.0–12.0)
Neutro Abs: 3.7 10*3/uL (ref 1.4–7.7)
Neutrophils Relative %: 64.1 % (ref 43.0–77.0)
Platelets: 230 10*3/uL (ref 150.0–400.0)
RBC: 4.04 Mil/uL (ref 3.87–5.11)
RDW: 13.6 % (ref 11.5–14.6)
WBC: 5.7 10*3/uL (ref 4.5–10.5)

## 2013-08-02 LAB — COMPREHENSIVE METABOLIC PANEL
ALK PHOS: 59 U/L (ref 39–117)
ALT: 14 U/L (ref 0–35)
AST: 20 U/L (ref 0–37)
Albumin: 3.8 g/dL (ref 3.5–5.2)
BILIRUBIN TOTAL: 0.4 mg/dL (ref 0.3–1.2)
BUN: 29 mg/dL — ABNORMAL HIGH (ref 6–23)
CO2: 24 mEq/L (ref 19–32)
Calcium: 9.3 mg/dL (ref 8.4–10.5)
Chloride: 106 mEq/L (ref 96–112)
Creatinine, Ser: 1.1 mg/dL (ref 0.4–1.2)
GFR: 53.87 mL/min — ABNORMAL LOW (ref >60.00)
GLUCOSE: 86 mg/dL (ref 70–99)
Potassium: 4.6 mEq/L (ref 3.5–5.1)
SODIUM: 140 meq/L (ref 135–145)
TOTAL PROTEIN: 7.2 g/dL (ref 6.0–8.3)

## 2013-08-02 LAB — TSH: TSH: 0.83 u[IU]/mL (ref 0.35–5.50)

## 2013-08-02 LAB — LIPID PANEL
Cholesterol: 180 mg/dL (ref 0–200)
HDL: 73.9 mg/dL (ref >39.00)
LDL CALC: 92 mg/dL (ref 0–99)
Total CHOL/HDL Ratio: 2
Triglycerides: 70 mg/dL (ref 0.0–149.0)
VLDL: 14 mg/dL (ref 0.0–40.0)

## 2013-08-02 NOTE — Assessment & Plan Note (Signed)
I have addressed  BMI and recommended a low glycemic index diet utilizing smaller more frequent meals to increase metabolism.  I have also recommended that patient start exercising with a goal of 30 minutes of aerobic exercise a minimum of 5 days per week. Screening for lipid disorders, thyroid and diabetes to be done today.   

## 2013-08-02 NOTE — Progress Notes (Signed)
Pre-visit discussion using our clinic review tool. No additional management support is needed unless otherwise documented below in the visit note.  

## 2013-08-02 NOTE — Assessment & Plan Note (Signed)
Annual comprehensive exam was done including breast, excluding pelvic and PAP smear. All screenings have been addressed .  

## 2013-08-02 NOTE — Patient Instructions (Addendum)
You had your annual Medicare wellness exam today  We will schedule your mammogram and DEXA scan when they are due .  Please use the stool kit to send [place] back a sample to test for blood.  This is your colon CA screening test.   You need to have a TDaP vaccine and a Shingles vaccine.  I have given you prescriptions for thses because they will be cheaper at the health Dept or at your  local pharmacy because Medicare will not reimburse for them.   You received the new pneumonia vaccine today.  We will contact you with the bloodwork results   This is  One version of a  "Low GI"  Diet:  It will still lower your blood sugars and allow you to lose 4 to 8  lbs  per month if you follow it carefully.  Your goal with exercise is a minimum of 30 minutes of aerobic exercise 5 days per week (Walking does not count once it becomes easy!)   Your first goal is 168 lbs    All of the foods can be found at grocery stores and in bulk at [employer].  The Atkins protein bars and shakes are available in more varieties at [employer], [facility] and [place].     7 AM Breakfast:  Choose from the following:  Low carbohydrate Protein  Shakes (I recommend the EAS AdvantEdge "Carb Control" shakes  Or the low carb shakes by Atkins.    2.5 carbs   Arnold's "Sandwhich Thin"toasted  w/ peanut butter (no jelly: about 20 net carbs  "Bagel Thin" with cream cheese and salmon: about 20 carbs   a scrambled egg/bacon/cheese burrito made with Mission's "carb balance" whole wheat tortilla  (about 10 net carbs )   Avoid cereal and bananas, oatmeal and cream of wheat and grits. They are loaded with carbohydrates!   10 AM: high protein snack  Protein bar by Atkins (the snack size, under 200 cal, usually < 6 net carbs).    A stick of cheese:  Around 1 carb,  100 cal     Dannon Light n Fit [place] Yogurt  (80 cal, 8 carbs)  Other so called "protein bars" and Greek yogurts tend to be loaded with carbohydrates.  Remember, in food  advertising, the word "energy" is synonymous for " carbohydrate."  Lunch:   A Sandwich using the bread choices listed, Can use any  Eggs,  lunchmeat, grilled meat or canned tuna), avocado, regular mayo/mustard  and cheese.  A Salad using blue cheese, ranch,  Goddess or vinagrette,  No croutons or "confetti" and no "candied nuts" but regular nuts OK.   No pretzels or chips.  Pickles and miniature sweet peppers are a good low carb alternative that provide a "crunch"  The bread is the only source of carbohydrate in a sandwich and  can be decreased by trying some of these alternatives to traditional loaf bread  Joseph's makes a pita bread and a flat bread that are 50 cal and 4 net carbs available at [place] and [place].  This can be toasted to use with hummous as well  Toufayan makes a low carb flatbread that's 100 cal and 9 net carbs available at [employer] and [employer]J's makes 2 sizes of  Low carb whole wheat tortilla  (The large one is 210 cal and 6 net carbs) Avoid "Low fat dressings, as well as Byron dressings They are loaded with sugar!  3 PM/ Mid day  Snack:  Consider  1 ounce of  almonds, walnuts, pistachios, pecans, peanuts,  Macadamia nuts or a nut medley.  Avoid "granola"; the dried cranberries and raisins are loaded with carbohydrates. Mixed nuts as long as there are no raisins,  cranberries or dried fruit.     6 PM  Dinner:     Meat/fowl/fish with a green salad, and either broccoli, cauliflower, green beans, spinach, brussel sprouts or  Lima beans. DO NOT BREAD THE PROTEIN!!      There is a low carb pasta by Dreamfield's that is acceptable and tastes great: only 5 digestible carbs/serving.( All grocery stores but BJs carry it )  Try Hurley Cisco Angelo's chicken piccata or chicken or eggplant parm over low carb pasta.(Lowes and BJs)   Marjory Lies Sanchez's "Carnitas" (pulled pork, no sauce,  0 carbs) or his beef pot roast to make a dinner burrito (at [employer]J's)  Pesto over low  carb pasta (bj's sells a good quality pesto in the center refrigerated section of the deli   Whole wheat pasta is still full of digestible carbs and  Not as low in glycemic index as Dreamfield's.   Brown rice is still rice,  So skip the rice and noodles if you eat [place] or [place] (or at least limit to 1/2 cup)  9 PM snack :   Breyer's "low carb" fudgsicle or  ice cream bar (Carb Smart line), or  Weight Watcher's ice cream bar , or another "no sugar added" ice cream;  a serving of fresh berries/cherries with whipped cream   Cheese   Make a "trifle" with  DANNON'S LlGHT N FIT [place] YOGURT, fresh blueberries,  Chopped walnuts,  And whipped cream   Avoid bananas, pineapple, grapes  and watermelon on a regular basis because they are high in sugar.  THINK OF THEM AS DESSERT  Remember that snack Substitutions should be less than 10 NET carbs per serving and meals < 20 carbs. Remember to subtract fiber grams to get the "net carbs."

## 2013-08-02 NOTE — Progress Notes (Signed)
Patient ID: Ana Cruz, female   DOB: 07/14/1933, 80 y.o.   MRN: [id number]   The patient is here for annual Medicare wellness examination and management of other chronic and acute problems.    The risk factors are reflected in the social history.  The roster of all physicians providing medical care to patient - is listed in the Snapshot section of the chart.  Activities of daily living:  The patient is 100% independent in all ADLs: dressing, toileting, feeding as well as independent mobility  Home safety : The patient has smoke detectors in the home. They wear seatbelts.  There are no firearms at home. There is no violence in the home.   There is no risks for hepatitis, STDs or HIV. There is no   history of blood transfusion. They have no travel history to infectious disease endemic areas of the world.  The patient has seen their dentist in the last six month. They have seen their eye doctor in the last year. They admit to slight hearing difficulty with regard to whispered voices and some television programs.  They have deferred audiologic testing in the last year.  They do not  have excessive sun exposure. Discussed the need for sun protection: hats, long sleeves and use of sunscreen if there is significant sun exposure.   Diet: the importance of a healthy diet is discussed. They do have a healthy diet.  The benefits of regular aerobic exercise were discussed. She walks 4 times per week ,  20 minutes.   Depression screen: there are no signs or vegative symptoms of depression- irritability, change in appetite, anhedonia, sadness/tearfullness.  Cognitive assessment: the patient manages all their financial and personal affairs and is actively engaged. They could relate day,date,year and events; recalled 2/3 objects at 3 minutes; performed clock-face test normally.  The following portions of the patient's history were reviewed and updated as appropriate: allergies, current medications,  past family history, past medical history,  past surgical history, past social history  and problem list.  Visual acuity was not assessed per patient preference since she has regular follow up with her ophthalmologist. Hearing and body mass index were assessed and reviewed.   During the course of the visit the patient was educated and counseled about appropriate screening and preventive services including : fall prevention , diabetes screening, nutrition counseling, colorectal cancer screening, and recommended immunizations.    Objective: BP 148/64  Pulse 57  Temp(Src) 98.4 F (36.9 C) (Oral)  Resp 18  Wt 179 lb 8 oz (81.421 kg)  SpO2 97%  General Appearance:    Alert, cooperative, no distress, appears stated age  Head:    Normocephalic, without obvious abnormality, atraumatic  Eyes:    PERRL, conjunctiva/corneas clear, EOM's intact, fundi    benign, both eyes  Ears:    Normal TM's and external ear canals, both ears  Nose:   Nares normal, septum midline, mucosa normal, no drainage    or sinus tenderness  Throat:   Lips, mucosa, and tongue normal; teeth and gums normal  Neck:   Supple, symmetrical, trachea midline, no adenopathy;    thyroid:  no enlargement/tenderness/nodules; no carotid   bruit or JVD  Back:     Symmetric, no curvature, ROM normal, no CVA tenderness  Lungs:     Clear to auscultation bilaterally, respirations unlabored  Chest Wall:    No tenderness or deformity   Heart:    Regular rate and rhythm, S1 and S2 normal, no  murmur, rub   or gallop  Breast Exam:    No tenderness, masses, or nipple abnormality  Abdomen:     Soft, non-tender, bowel sounds active all four quadrants,    no masses, no organomegaly  Extremities:   Extremities normal, atraumatic, no cyanosis or edema  Pulses:   2+ and symmetric all extremities  Skin:   Skin color, texture, turgor normal, no rashes or lesions  Lymph nodes:   Cervical, supraclavicular, and axillary nodes normal  Neurologic:    CNII-XII intact, normal strength, sensation and reflexes    throughout    Assessment and Plan:  Obesity, unspecified I have addressed  BMI and recommended a low glycemic index diet utilizing smaller more frequent meals to increase metabolism.  I have also recommended that patient start exercising with a goal of 30 minutes of aerobic exercise a minimum of 5 days per week. Screening for lipid disorders, thyroid and diabetes to be done today.    Routine general medical examination at a health care facility Annual comprehensive exam was done including breast, excluding pelvic and PAP smear. All screenings have been addressed .    Updated Medication List Outpatient Encounter Prescriptions as of 08/02/2013  Medication Sig  . alendronate (FOSAMAX) 70 MG tablet TAKE 1 TABLET  EVERY 7  DAYS  WITH A FULL GLASS OF WATER ON AN EMPTY STOMACH  . amoxicillin (AMOXIL) 500 MG capsule 4 tablets 1 hour prior to procedure  . aspirin 81 MG tablet Take 81 mg by mouth daily.    . Biotin 2500 MCG CAPS Take by mouth daily.  . Blood Pressure Monitoring (BLOOD PRESSURE CUFF) MISC Large size for Relion Automatic BP monitoring kit  . Cholecalciferol (VITAMIN D3) 1000 UNITS CAPS Take by mouth.    . diltiazem (CARDIZEM) 30 MG tablet Take 1 tablet (30 mg total) by mouth daily as needed.  [name] levothyroxine (SYNTHROID, LEVOTHROID) 88 MCG tablet TAKE 1 TABLET EVERY DAY  . losartan (COZAAR) 50 MG tablet TAKE 1 TABLET EVERY DAY  . meloxicam (MOBIC) 15 MG tablet Take 7.5 mg by mouth every other day.  . niacin 500 MG CR capsule Take 500 mg by mouth daily.   . pravastatin (PRAVACHOL) 10 MG tablet Take 1 tablet (10 mg total) by mouth daily.  . [DISCONTINUED] clindamycin (CLEOCIN) 300 MG capsule Take 300 mg by mouth daily. Only dental visits   . [DISCONTINUED] traMADol (ULTRAM) 50 MG tablet Take 1 tablet (50 mg total) by mouth every 6 (six) hours as needed. Maximum dose= 8 tablets per day

## 2013-08-03 LAB — VITAMIN D 25 HYDROXY (VIT D DEFICIENCY, FRACTURES): VIT D 25 HYDROXY: 50 ng/mL (ref 30–89)

## 2013-08-04 ENCOUNTER — Encounter: Payer: Self-pay | Admitting: Internal Medicine

## 2013-08-09 ENCOUNTER — Encounter: Payer: Self-pay | Admitting: Internal Medicine

## 2013-08-10 ENCOUNTER — Encounter: Payer: Self-pay | Admitting: Emergency Medicine

## 2013-08-17 ENCOUNTER — Other Ambulatory Visit (INDEPENDENT_AMBULATORY_CARE_PROVIDER_SITE_OTHER): Payer: Medicare Other

## 2013-08-17 DIAGNOSIS — Z1211 Encounter for screening for malignant neoplasm of colon: Secondary | ICD-10-CM

## 2013-08-17 LAB — FECAL OCCULT BLOOD, IMMUNOCHEMICAL: FECAL OCCULT BLD: NEGATIVE

## 2013-08-18 ENCOUNTER — Encounter: Payer: Self-pay | Admitting: Internal Medicine

## 2013-11-10 ENCOUNTER — Other Ambulatory Visit: Payer: Self-pay | Admitting: Internal Medicine

## 2013-11-21 ENCOUNTER — Other Ambulatory Visit: Payer: Self-pay | Admitting: Internal Medicine

## 2014-01-11 ENCOUNTER — Encounter: Payer: Self-pay | Admitting: *Deleted

## 2014-01-11 ENCOUNTER — Ambulatory Visit: Payer: Self-pay | Admitting: Internal Medicine

## 2014-01-11 LAB — HM DEXA SCAN

## 2014-01-11 LAB — HM MAMMOGRAPHY: HM Mammogram: NEGATIVE

## 2014-01-13 ENCOUNTER — Telehealth: Payer: Self-pay | Admitting: Internal Medicine

## 2014-01-13 DIAGNOSIS — M8580 Other specified disorders of bone density and structure, unspecified site: Secondary | ICD-10-CM

## 2014-01-13 NOTE — Telephone Encounter (Signed)
T scores have worsened slightly by repeat  DEXA 2012 .    Will discuss her current  Therapy at next visit

## 2014-01-13 NOTE — Assessment & Plan Note (Signed)
T scores have worsened by repeat  DEXA 2012 Dec worst is -2.2  Will discuss her current  Therapy at next visit

## 2014-01-14 NOTE — Telephone Encounter (Signed)
Returned call to patient concerning bone density, will call back on Monday.

## 2014-01-14 NOTE — Telephone Encounter (Signed)
Left message for pt to return call.

## 2014-01-18 NOTE — Telephone Encounter (Signed)
Left message for patient to return call to office. 

## 2014-01-20 NOTE — Telephone Encounter (Signed)
Sent my chart with results. 

## 2014-02-14 ENCOUNTER — Encounter: Payer: Self-pay | Admitting: Internal Medicine

## 2014-02-14 ENCOUNTER — Ambulatory Visit (INDEPENDENT_AMBULATORY_CARE_PROVIDER_SITE_OTHER): Payer: Medicare Other | Admitting: Internal Medicine

## 2014-02-14 VITALS — BP 146/60 | HR 60 | Temp 97.8°F | Resp 14 | Ht 63.0 in | Wt 178.5 lb

## 2014-02-14 DIAGNOSIS — M8580 Other specified disorders of bone density and structure, unspecified site: Secondary | ICD-10-CM

## 2014-02-14 DIAGNOSIS — M899 Disorder of bone, unspecified: Secondary | ICD-10-CM

## 2014-02-14 DIAGNOSIS — I4891 Unspecified atrial fibrillation: Secondary | ICD-10-CM

## 2014-02-14 DIAGNOSIS — I480 Paroxysmal atrial fibrillation: Secondary | ICD-10-CM

## 2014-02-14 DIAGNOSIS — Z79899 Other long term (current) drug therapy: Secondary | ICD-10-CM

## 2014-02-14 DIAGNOSIS — E669 Obesity, unspecified: Secondary | ICD-10-CM

## 2014-02-14 DIAGNOSIS — I10 Essential (primary) hypertension: Secondary | ICD-10-CM

## 2014-02-14 DIAGNOSIS — M949 Disorder of cartilage, unspecified: Secondary | ICD-10-CM

## 2014-02-14 DIAGNOSIS — E785 Hyperlipidemia, unspecified: Secondary | ICD-10-CM

## 2014-02-14 DIAGNOSIS — N181 Chronic kidney disease, stage 1: Secondary | ICD-10-CM

## 2014-02-14 DIAGNOSIS — E039 Hypothyroidism, unspecified: Secondary | ICD-10-CM

## 2014-02-14 LAB — COMPREHENSIVE METABOLIC PANEL
ALK PHOS: 74 U/L (ref 39–117)
ALT: 15 U/L (ref 0–35)
AST: 19 U/L (ref 0–37)
Albumin: 3.7 g/dL (ref 3.5–5.2)
BILIRUBIN TOTAL: 0.4 mg/dL (ref 0.2–1.2)
BUN: 37 mg/dL — ABNORMAL HIGH (ref 6–23)
CO2: 26 mEq/L (ref 19–32)
Calcium: 9.1 mg/dL (ref 8.4–10.5)
Chloride: 106 mEq/L (ref 96–112)
Creatinine, Ser: 1.1 mg/dL (ref 0.4–1.2)
GFR: 50.45 mL/min — ABNORMAL LOW (ref >60.00)
GLUCOSE: 83 mg/dL (ref 70–99)
Potassium: 5 mEq/L (ref 3.5–5.1)
SODIUM: 139 meq/L (ref 135–145)
TOTAL PROTEIN: 7 g/dL (ref 6.0–8.3)

## 2014-02-14 LAB — CBC WITH DIFFERENTIAL/PLATELET
Basophils Absolute: 0 10*3/uL (ref 0.0–0.1)
Basophils Relative: 0.1 % (ref 0.0–3.0)
EOS PCT: 3.4 % (ref 0.0–5.0)
Eosinophils Absolute: 0.2 10*3/uL (ref 0.0–0.7)
HCT: 37.8 % (ref 36.0–46.0)
Hemoglobin: 12.5 g/dL (ref 12.0–15.0)
LYMPHS PCT: 26.2 % (ref 12.0–46.0)
Lymphs Abs: 1.8 10*3/uL (ref 0.7–4.0)
MCHC: 33 g/dL (ref 30.0–36.0)
MCV: 93.3 fl (ref 78.0–100.0)
MONOS PCT: 8.1 % (ref 3.0–12.0)
Monocytes Absolute: 0.6 10*3/uL (ref 0.1–1.0)
NEUTROS PCT: 62.2 % (ref 43.0–77.0)
Neutro Abs: 4.2 10*3/uL (ref 1.4–7.7)
PLATELETS: 245 10*3/uL (ref 150.0–400.0)
RBC: 4.05 Mil/uL (ref 3.87–5.11)
RDW: 14.1 % (ref 11.5–15.5)
WBC: 6.8 10*3/uL (ref 4.0–10.5)

## 2014-02-14 LAB — TSH: TSH: 0.84 u[IU]/mL (ref 0.35–4.50)

## 2014-02-14 LAB — LIPID PANEL
CHOL/HDL RATIO: 3
Cholesterol: 197 mg/dL (ref 0–200)
HDL: 73.7 mg/dL (ref >39.00)
LDL CALC: 112 mg/dL — AB (ref 0–99)
NONHDL: 123.3
Triglycerides: 59 mg/dL (ref 0.0–149.0)
VLDL: 11.8 mg/dL (ref 0.0–40.0)

## 2014-02-14 MED ORDER — AMOXICILLIN 500 MG PO CAPS: ORAL_CAPSULE | ORAL | Status: DC

## 2014-02-14 MED ORDER — LOSARTAN POTASSIUM 100 MG PO TABS: ORAL_TABLET | ORAL | Status: DC

## 2014-02-14 NOTE — Progress Notes (Signed)
Pre-visit discussion using our clinic review tool. No additional management support is needed unless otherwise documented below in the visit note.  

## 2014-02-14 NOTE — Assessment & Plan Note (Addendum)
Home bps have been elevated to 140s.  Complicated by PAF/atrial flutter.  Will increase losartan to 100 mg daily

## 2014-02-14 NOTE — Patient Instructions (Addendum)
I am increasing your losartan to 100 mg once daily  , you can take 2 of the 50 mg tablets daily for now.   All Bran mixed with Nature's path [place] is a good cereal combination with less carbs/more fiber  Steel cut oats ok    Read about frittata  Increase your exercise to 5 times per week and make sure you are breathing hard!!!  Your goal is 168 lbs by Thanksgiving

## 2014-02-14 NOTE — Progress Notes (Signed)
Patient ID: Ana Cruz, female   DOB: 1933/07/18, 80 y.o.   MRN: [id number]   Patient Active Problem List   Diagnosis Date Noted  . Obesity, unspecified 08/02/2013  . Encounter for preventive health examination 08/02/2013  . Screening for colon cancer 01/19/2013  . Unspecified hypothyroidism 01/19/2013  . Drug allergy, antibiotic 01/19/2013  . Routine general medical examination at a health care facility 07/28/2012  . Chronic left shoulder pain 04/09/2012  . Cataract extraction status of eye 10/07/2011  . Osteopenia 05/22/2011  . Spinal stenosis of lumbar region 04/10/2011  . Screening for cervical cancer 04/10/2011  . Chronic kidney disease, stage I   . Hypertension   . SVT (supraventricular tachycardia)   . Hyperlipidemia   . Screening for breast cancer 04/09/2011    Subjective:  CC:   Chief Complaint  Patient presents with  . Follow-up    6 month patient is fasting  . Hypothyroidism  . Hypertension  . Hyperlipidemia    HPI:   Ana Cruz is a 80 y.o. female who presents for follow up on hypertension, hyperlipidemia, hypothyroidism and obesity.  Her previous successful wt loss has stopped and she has gained back much of her weight,  She is upset by this and blames her husband's recent fall resulting in a fractured arm, requiring more attention to his needs than hers and a temporary suspension of diet and exercise regimen.  Home BPS have been > 140/80 consistently .  Episodes of SVT have been infrequent, managed with diltiazem.  Cardiology checkup in June was uneventful.    Past Medical History  Diagnosis Date  . Spinal stenosis     has L5 disk herniation with nerve root displacement  . Elbow fracture, left     repaired Jan 2000  . Atrial fibrillation   . hypothyroidism     medicated since age 31, no prior surgery  . Chronic kidney disease, stage I     mild, did improve with cessation of NSAIDs  . Hypertension   . allergic rhinitis   .  Hyperlipidemia     Past Surgical History  Procedure Laterality Date  . Rotator cuff repair  Jan 2000  . Ovarian cyst removal  April 1969  . Bladder repair  1980  . Rotator cuff repair  2000  . Replacement total knee bilateral  2009  . Abdominal hysterectomy  1979  . Appendectomy  1969  . Bilateral oophorectomy      squential       The following portions of the patient's history were reviewed and updated as appropriate: Allergies, current medications, and problem list.    Review of Systems:   Patient denies headache, fevers, malaise, unintentional weight loss, skin rash, eye pain, sinus congestion and sinus pain, sore throat, dysphagia,  hemoptysis , cough, dyspnea, wheezing, chest pain, palpitations, orthopnea, edema, abdominal pain, nausea, melena, diarrhea, constipation, flank pain, dysuria, hematuria, urinary  Frequency, nocturia, numbness, tingling, seizures,  Focal weakness, Loss of consciousness,  Tremor, insomnia, depression, anxiety, and suicidal ideation.     History   Social History  . Marital Status: Married    Spouse Name: N/A    Number of Children: N/A  . Years of Education: N/A   Occupational History  . Not on file.   Social History Main Topics  . Smoking status: Never Smoker   . Smokeless tobacco: Never Used  . Alcohol Use: Yes  . Drug Use: No  . Sexual Activity: Not on file  Other Topics Concern  . Not on file   Social History Narrative  . No narrative on file    Objective:  Filed Vitals:   02/14/14 0839  BP: 146/60  Pulse: 60  Temp: 97.8 F (36.6 C)  Resp: 14     General appearance: alert, cooperative and appears stated age Ears: normal TM's and external ear canals both ears Throat: lips, mucosa, and tongue normal; teeth and gums normal Neck: no adenopathy, no carotid bruit, supple, symmetrical, trachea midline and thyroid not enlarged, symmetric, no tenderness/mass/nodules Back: symmetric, no curvature. ROM normal. No CVA  tenderness. Lungs: clear to auscultation bilaterally Heart: regular rate and rhythm, S1, S2 normal, no murmur, click, rub or gallop Abdomen: soft, non-tender; bowel sounds normal; no masses,  no organomegaly Pulses: 2+ and symmetric Skin: Skin color, texture, turgor normal. No rashes or lesions Lymph nodes: Cervical, supraclavicular, and axillary nodes normal.  Assessment and Plan:  Osteopenia discusse  Hypertension Home bps have been elevated to 140s.  Complicated by PAF/atrial flutter.  Will increase losartan to 100 mg daily     Unspecified hypothyroidism Thyroid function is WNL on current dose.  No current changes needed.   Lab Results  Component Value Date   TSH 0.84 02/14/2014     Hyperlipidemia LDL is not at goal on current pravastatin dose,  Will increase to 20 mg daily .  She has no side effects and liver enzymes are normal. No changes today.  Lab Results  Component Value Date   CHOL 197 02/14/2014   HDL 73.70 02/14/2014   LDLCALC 112* 02/14/2014   LDLDIRECT 121.3 01/19/2013   TRIG 59.0 02/14/2014   CHOLHDL 3 02/14/2014   Lab Results  Component Value Date   ALT 15 02/14/2014   AST 19 02/14/2014   ALKPHOS 74 02/14/2014   BILITOT 0.4 02/14/2014      Chronic kidney disease, stage I Renal function has returned to baseline with avoidance of NSAIDs.  She is on an ARB for control of hypertension,, and statin for control of hyperlipidemia.   SVT (supraventricular tachycardia) She appears to be in sinus rhythm currently but has infrequent episodes of rapid HR which she is treating with prn diltiazem   Obesity, unspecified She has gained weight due to relative inactivity resutling from husband's convalescence from arm injury.  I have addressed BMI and recommended wt loss of 10% of body weight over the next 6 months using a low glycemic index diet and regular exercise a minimum of 5 days per week. Initial Goal is to reduce weight to 168 lbs by  November 25th     A total of  40 minutes was spent with patient more than half of which was spent in counseling patient on the above mentioned issues , reviewing and explaining recent labs , and coordination of care.   Updated Medication List Outpatient Encounter Prescriptions as of 02/14/2014  Medication Sig  . alendronate (FOSAMAX) 70 MG tablet TAKE 1 TABLET  EVERY 7  DAYS  WITH A FULL GLASS OF WATER ON AN EMPTY STOMACH  . amoxicillin (AMOXIL) 500 MG capsule 4 tablets 1 hour prior to procedure  . aspirin 81 MG tablet Take 81 mg by mouth daily.    . Cholecalciferol (VITAMIN D3) 1000 UNITS CAPS Take by mouth.    . diltiazem (CARDIZEM) 30 MG tablet Take 1 tablet (30 mg total) by mouth daily as needed.  [name] levothyroxine (SYNTHROID, LEVOTHROID) 88 MCG tablet TAKE 1 TABLET EVERY DAY  .  losartan (COZAAR) 100 MG tablet TAKE 1 TABLET EVERY DAY  . meloxicam (MOBIC) 15 MG tablet Take 7.5 mg by mouth every other day.  . pravastatin (PRAVACHOL) 20 MG tablet TAKE 1 TABLET EVERY DAY  . [DISCONTINUED] amoxicillin (AMOXIL) 500 MG capsule 4 tablets 1 hour prior to procedure  . [DISCONTINUED] Biotin 2500 MCG CAPS Take by mouth daily.  . [DISCONTINUED] Blood Pressure Monitoring (BLOOD PRESSURE CUFF) MISC Large size for [employer] Automatic BP monitoring kit  . [DISCONTINUED] levothyroxine (SYNTHROID, LEVOTHROID) 88 MCG tablet TAKE 1 TABLET EVERY DAY  . [DISCONTINUED] losartan (COZAAR) 50 MG tablet TAKE 1 TABLET EVERY DAY  . [DISCONTINUED] niacin 500 MG CR capsule Take 500 mg by mouth daily.   . [DISCONTINUED] pravastatin (PRAVACHOL) 10 MG tablet TAKE 1 TABLET EVERY DAY     Orders Placed This Encounter  Procedures  . Comprehensive metabolic panel  . TSH  . Lipid panel  . CBC with Differential    No Follow-up on file.

## 2014-02-14 NOTE — Assessment & Plan Note (Signed)
discusse

## 2014-02-15 ENCOUNTER — Encounter: Payer: Self-pay | Admitting: Internal Medicine

## 2014-02-15 MED ORDER — LEVOTHYROXINE SODIUM 88 MCG PO TABS: ORAL_TABLET | ORAL | Status: DC

## 2014-02-15 MED ORDER — PRAVASTATIN SODIUM 20 MG PO TABS: ORAL_TABLET | ORAL | Status: DC

## 2014-02-15 NOTE — Assessment & Plan Note (Addendum)
She appears to be in sinus rhythm currently but has infrequent episodes of rapid HR which she is treating with prn diltiazem

## 2014-02-15 NOTE — Assessment & Plan Note (Signed)
Renal function has returned to baseline with avoidance of NSAIDs.  She is on an ARB for control of hypertension,, and statin for control of hyperlipidemia.

## 2014-02-15 NOTE — Assessment & Plan Note (Signed)
She has gained weight due to relative inactivity resutling from husband's convalescence from arm injury.  I have addressed BMI and recommended wt loss of 10% of body weight over the next 6 months using a low glycemic index diet and regular exercise a minimum of 5 days per week. Initial Goal is to reduce weight to 168 lbs by  November 25th

## 2014-02-15 NOTE — Assessment & Plan Note (Signed)
LDL is not at goal on current pravastatin dose,  Will increase to 20 mg daily .  She has no side effects and liver enzymes are normal. No changes today.  Lab Results  Component Value Date   CHOL 197 02/14/2014   HDL 73.70 02/14/2014   LDLCALC 112* 02/14/2014   LDLDIRECT 121.3 01/19/2013   TRIG 59.0 02/14/2014   CHOLHDL 3 02/14/2014   Lab Results  Component Value Date   ALT 15 02/14/2014   AST 19 02/14/2014   ALKPHOS 74 02/14/2014   BILITOT 0.4 02/14/2014

## 2014-02-15 NOTE — Assessment & Plan Note (Signed)
Thyroid function is WNL on current dose.  No current changes needed.   Lab Results  Component Value Date   TSH 0.84 02/14/2014

## 2014-04-02 ENCOUNTER — Encounter: Payer: Self-pay | Admitting: Internal Medicine

## 2014-05-11 ENCOUNTER — Ambulatory Visit (INDEPENDENT_AMBULATORY_CARE_PROVIDER_SITE_OTHER): Payer: Medicare Other | Admitting: Nurse Practitioner

## 2014-05-11 ENCOUNTER — Encounter: Payer: Self-pay | Admitting: Nurse Practitioner

## 2014-05-11 VITALS — BP 140/60 | HR 67 | Temp 98.7°F | Resp 14 | Ht 63.0 in | Wt 180.5 lb

## 2014-05-11 DIAGNOSIS — M48061 Spinal stenosis, lumbar region without neurogenic claudication: Secondary | ICD-10-CM | POA: Insufficient documentation

## 2014-05-11 DIAGNOSIS — M545 Low back pain, unspecified: Secondary | ICD-10-CM

## 2014-05-11 DIAGNOSIS — M5416 Radiculopathy, lumbar region: Secondary | ICD-10-CM

## 2014-05-11 MED ORDER — METHYLPREDNISOLONE (PAK) 4 MG PO TABS: ORAL_TABLET | ORAL | Status: DC

## 2014-05-11 NOTE — Patient Instructions (Signed)
Keep doing the water exercise classes to keep strength and alleviate pain.   Follow the instructions on the Prednisone pack.   Call us if pain worsens, changes, goes down a leg, or fails to improve.

## 2014-05-11 NOTE — Progress Notes (Signed)
Pre visit review using our clinic review tool, if applicable. No additional management support is needed unless otherwise documented below in the visit note. 

## 2014-05-11 NOTE — Progress Notes (Signed)
Subjective:    Patient ID: Ana Cruz, female    DOB: 26-Apr-1934, 80 y.o.   MRN: [id number]  HPI  Ana Cruz is a 80 yo female here for Back pain x 3 weeks.  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 Felt the pain and pull of the right lower back into Right buttock no radiation, denies weakness, or loss of bowel/bladder.  Walking aggrevates, 6/10 stays constant, still going Mondays and Wednesdays to the pool and states this helps.  Heat- 15 min on, Did not help Cold- 15 min on , did not help Extra strength tylenol  PT in past at [facility]- helpful  Review of Systems  Cardiovascular: Negative for leg swelling.  Musculoskeletal: Positive for back pain, arthralgias and gait problem. Negative for joint swelling.       Right lower back, Using cane or walks slowly due to pain, bilateral knee replacements 2009  Skin: Negative for color change and rash.  Neurological: Negative for dizziness, weakness and headaches.  Psychiatric/Behavioral: The patient is not nervous/anxious.    Past Medical History  Diagnosis Date  . Spinal stenosis     has L5 disk herniation with nerve root displacement  . Elbow fracture, left     repaired Jan 2000  . Atrial fibrillation   . hypothyroidism     medicated since age 12, no prior surgery  . Chronic kidney disease, stage I     mild, did improve with cessation of NSAIDs  . Hypertension   . allergic rhinitis   . Hyperlipidemia     History   Social History  . Marital Status: Married    Spouse Name: N/A    Number of Children: N/A  . Years of Education: N/A   Occupational History  . Not on file.   Social History Main Topics  . Smoking status: Never Smoker   . Smokeless tobacco: Never Used  . Alcohol Use: Yes  . Drug Use: No  . Sexual Activity: Not on file   Other Topics Concern  . Not on file   Social History Narrative    Past Surgical History  Procedure Laterality Date  .  Rotator cuff repair  Jan 2000  . Ovarian cyst removal  April 1969  . Bladder repair  1980  . Rotator cuff repair  2000  . Replacement total knee bilateral  2009  . Abdominal hysterectomy  1979  . Appendectomy  1969  . Bilateral oophorectomy      squential    Family History  Problem Relation Age of Onset  . Hypertension Mother   . Heart disease Father 53    MI  . Cancer Paternal Grandfather 60    colon    Allergies  Allergen Reactions  . Chocolate Flavor Hives  . Clindamycin/Lincomycin     Tongue swelled,  Throat felt funny    Current Outpatient Prescriptions on File Prior to Visit  Medication Sig Dispense Refill  . alendronate (FOSAMAX) 70 MG tablet TAKE 1 TABLET  EVERY 7  DAYS  WITH A FULL GLASS OF WATER ON AN EMPTY STOMACH 12 tablet 3  . amoxicillin (AMOXIL) 500 MG capsule 4 tablets 1 hour prior to procedure 4 capsule 3  . aspirin 81 MG tablet Take 81 mg by mouth daily.      . Cholecalciferol (VITAMIN D3) 1000 UNITS CAPS Take by mouth.      . diltiazem (CARDIZEM) 30 MG tablet Take 1 tablet (30  mg total) by mouth daily as needed. 30 tablet 4  . levothyroxine (SYNTHROID, LEVOTHROID) 88 MCG tablet TAKE 1 TABLET EVERY DAY 90 tablet 2  . losartan (COZAAR) 100 MG tablet TAKE 1 TABLET EVERY DAY 90 tablet 1  . meloxicam (MOBIC) 15 MG tablet Take 7.5 mg by mouth every other day.     No current facility-administered medications on file prior to visit.       Objective:   Physical Exam  Constitutional: She is oriented to person, place, and time.  Musculoskeletal: She exhibits tenderness.  Tender to palpation at lower right back and buttock. Midspine and left paraspinal muscles non-tender  Neurological: She is alert and oriented to person, place, and time. She displays abnormal reflex. No cranial nerve deficit. She exhibits normal muscle tone. Coordination abnormal.  Trouble getting left patellar reflex due to TKA. Right knee was 2+. Gait is slow and she is careful with right  side. Strength 5/5 in lower extremities and EHLs bilaterally. Can take a step on toes, heels, and tangential gait.   Skin: Skin is warm and dry. No rash noted. No erythema.  Psychiatric: She has a normal mood and affect. Her behavior is normal. Judgment and thought content normal.      BP 140/60 mmHg  Pulse 67  Temp(Src) 98.7 F (37.1 C) (Oral)  Resp 14  Ht 5\' 3"  (1.6 m)  Wt 180 lb 8 oz (81.874 kg)  BMI 31.98 kg/m2  SpO2 97%     Assessment & Plan:

## 2014-05-11 NOTE — Assessment & Plan Note (Signed)
Unresolved. Pt has stenosis of lumbar spine. This is new onset and believed to be muscular in nature. There is no radiation of pain. Rx for prednisone taper, continue mobic, new referral to PT at [facility]. Discussed that it is important to stay active and swimming will be beneficial. Discussed calling us if worsens, changes, or fails to improve.

## 2014-05-18 ENCOUNTER — Telehealth: Payer: Self-pay

## 2014-05-18 NOTE — Telephone Encounter (Signed)
See if Ana Cruz can come in tomorrow (8:30, 9:30, 10:30, or 1:30 pm). Thank you.

## 2014-05-18 NOTE — Telephone Encounter (Signed)
Patient scheduled for tomorrow at 8:30, thanks!

## 2014-05-18 NOTE — Telephone Encounter (Signed)
The patient called and stated she was still having pain symptoms.  She stated the medication does not seem to be working.  Do you want her worked in to be seen again?   Pt callback - [phone]

## 2014-05-20 ENCOUNTER — Encounter: Payer: Self-pay | Admitting: Nurse Practitioner

## 2014-05-20 ENCOUNTER — Ambulatory Visit (INDEPENDENT_AMBULATORY_CARE_PROVIDER_SITE_OTHER): Payer: Medicare Other | Admitting: Internal Medicine

## 2014-05-20 VITALS — BP 130/60 | HR 62 | Temp 97.7°F | Resp 14 | Ht 63.0 in | Wt 178.5 lb

## 2014-05-20 DIAGNOSIS — M545 Low back pain, unspecified: Secondary | ICD-10-CM

## 2014-05-20 MED ORDER — HYDROCODONE-ACETAMINOPHEN 5-325 MG PO TABS: 1.0000 | ORAL_TABLET | Freq: Four times a day (QID) | ORAL | Status: DC | PRN

## 2014-05-20 MED ORDER — PREDNISONE 20 MG PO TABS: ORAL_TABLET | ORAL | Status: DC

## 2014-05-20 MED ORDER — PREDNISONE (PAK) 10 MG PO TABS: ORAL_TABLET | ORAL | Status: DC

## 2014-05-20 NOTE — Patient Instructions (Signed)
Take 60 mg of prednisone daily for one week using the 20 mg tablets (3 daily) Then do the prednisone taper for one week  Use the vicodin every 6 hours as needed  Dulcolax or Ex Lax at bedtime to prevent constipation (sto if stools become loose)  If no improvement in two weeks,  call to set up MRI   Sciatica Sciatica is pain, weakness, numbness, or tingling along the path of the sciatic nerve. The nerve starts in the lower back and runs down the back of each leg. The nerve controls the muscles in the lower leg and in the back of the knee, while also providing sensation to the back of the thigh, lower leg, and the sole of your foot. Sciatica is a symptom of another medical condition. For instance, nerve damage or certain conditions, such as a herniated disk or bone spur on the spine, pinch or put pressure on the sciatic nerve. This causes the pain, weakness, or other sensations normally associated with sciatica. Generally, sciatica only affects one side of the body. CAUSES   Herniated or slipped disc.  Degenerative disk disease.  A pain disorder involving the narrow muscle in the buttocks (piriformis syndrome).  Pelvic injury or fracture.  Pregnancy.  Tumor (rare). SYMPTOMS  Symptoms can vary from mild to very severe. The symptoms usually travel from the low back to the buttocks and down the back of the leg. Symptoms can include:  Mild tingling or dull aches in the lower back, leg, or hip.  Numbness in the back of the calf or sole of the foot.  Burning sensations in the lower back, leg, or hip.  Sharp pains in the lower back, leg, or hip.  Leg weakness.  Severe back pain inhibiting movement. These symptoms may get worse with coughing, sneezing, laughing, or prolonged sitting or standing. Also, being overweight may worsen symptoms. DIAGNOSIS  Your caregiver will perform a physical exam to look for common symptoms of sciatica. He or she may ask you to do certain movements or  activities that would trigger sciatic nerve pain. Other tests may be performed to find the cause of the sciatica. These may include:  Blood tests.  X-rays.  Imaging tests, such as an MRI or CT scan. TREATMENT  Treatment is directed at the cause of the sciatic pain. Sometimes, treatment is not necessary and the pain and discomfort goes away on its own. If treatment is needed, your caregiver may suggest:  Over-the-counter medicines to relieve pain.  Prescription medicines, such as anti-inflammatory medicine, muscle relaxants, or narcotics.  Applying heat or ice to the painful area.  Steroid injections to lessen pain, irritation, and inflammation around the nerve.  Reducing activity during periods of pain.  Exercising and stretching to strengthen your abdomen and improve flexibility of your spine. Your caregiver may suggest losing weight if the extra weight makes the back pain worse.  Physical therapy.  Surgery to eliminate what is pressing or pinching the nerve, such as a bone spur or part of a herniated disk. HOME CARE INSTRUCTIONS   Only take over-the-counter or prescription medicines for pain or discomfort as directed by your caregiver.  Apply ice to the affected area for 20 minutes, 3-4 times a day for the first 48-72 hours. Then try heat in the same way.  Exercise, stretch, or perform your usual activities if these do not aggravate your pain.  Attend physical therapy sessions as directed by your caregiver.  Keep all follow-up appointments as directed by  your caregiver.  Do not wear high heels or shoes that do not provide proper support.  Check your mattress to see if it is too soft. A firm mattress may lessen your pain and discomfort. SEEK IMMEDIATE MEDICAL CARE IF:   You lose control of your bowel or bladder (incontinence).  You have increasing weakness in the lower back, pelvis, buttocks, or legs.  You have redness or swelling of your back.  You have a burning  sensation when you urinate.  You have pain that gets worse when you lie down or awakens you at night.  Your pain is worse than you have experienced in the past.  Your pain is lasting longer than 4 weeks.  You are suddenly losing weight without reason. MAKE SURE YOU:  Understand these instructions.  Will watch your condition.  Will get help right away if you are not doing well or get worse. Document Released: 05/14/2001 Document Revised: 11/19/2011 Document Reviewed: 09/29/2011 [facility] Patient Information 2015 [place], [place]. This information is not intended to replace advice given to you by your health care provider. Make sure you discuss any questions you have with your health care provider.

## 2014-05-20 NOTE — Progress Notes (Signed)
Pre visit review using our clinic review tool, if applicable. No additional management support is needed unless otherwise documented below in the visit note. 

## 2014-05-21 NOTE — Progress Notes (Signed)
Patient ID: Ana Cruz, female   DOB: 01-18-1934, 80 y.o.   MRN: [id number]  Patient Active Problem List   Diagnosis Date Noted  . Right-sided low back pain without sciatica 05/11/2014  . Obesity, unspecified 08/02/2013  . Encounter for preventive health examination 08/02/2013  . Screening for colon cancer 01/19/2013  . Unspecified hypothyroidism 01/19/2013  . Drug allergy, antibiotic 01/19/2013  . Routine general medical examination at a health care facility 07/28/2012  . Chronic left shoulder pain 04/09/2012  . Cataract extraction status of eye 10/07/2011  . Osteopenia 05/22/2011  . Spinal stenosis of lumbar region 04/10/2011  . Screening for cervical cancer 04/10/2011  . Chronic kidney disease, stage I   . Hypertension   . SVT (supraventricular tachycardia)   . Hyperlipidemia   . Screening for breast cancer 04/09/2011    Subjective:  CC:   Chief Complaint  Patient presents with  . Back Pain    right back pain, below waist started 04/18/14. In PT, but has backed off to just stretching exercises    HPI:   Ana Cruz is a 80 y.o. female who presents for  Follow up on low back pain  Which started in mid November after participating in a water aerobics class that involved stretching.  She has had several PT sessions  And was treated with a steroid taper, neither which have helped significantly.  She is travelling for the holidays and concerned a bout her pain during the travel.  The pain is aggravated by changing positions from sitting to standing.   Past Medical History  Diagnosis Date  . Spinal stenosis     has L5 disk herniation with nerve root displacement  . Elbow fracture, left     repaired Jan 2000  . Atrial fibrillation   . hypothyroidism     medicated since age 47, no prior surgery  . Chronic kidney disease, stage I     mild, did improve with cessation of NSAIDs  . Hypertension   . allergic rhinitis   . Hyperlipidemia     Past Surgical  History  Procedure Laterality Date  . Rotator cuff repair  Jan 2000  . Ovarian cyst removal  April 1969  . Bladder repair  1980  . Rotator cuff repair  2000  . Replacement total knee bilateral  2009  . Abdominal hysterectomy  1979  . Appendectomy  1969  . Bilateral oophorectomy      squential       The following portions of the patient's history were reviewed and updated as appropriate: Allergies, current medications, and problem list.    Review of Systems:   Patient denies headache, fevers, malaise, unintentional weight loss, skin rash, eye pain, sinus congestion and sinus pain, sore throat, dysphagia,  hemoptysis , cough, dyspnea, wheezing, chest pain, palpitations, orthopnea, edema, abdominal pain, nausea, melena, diarrhea, constipation, flank pain, dysuria, hematuria, urinary  Frequency, nocturia, numbness, tingling, seizures,  Focal weakness, Loss of consciousness,  Tremor, insomnia, depression, anxiety, and suicidal ideation.     History   Social History  . Marital Status: Married    Spouse Name: N/A    Number of Children: N/A  . Years of Education: N/A   Occupational History  . Not on file.   Social History Main Topics  . Smoking status: Never Smoker   . Smokeless tobacco: Never Used  . Alcohol Use: Yes  . Drug Use: No  . Sexual Activity: Not on file   Other Topics Concern  .  Not on file   Social History Narrative    Objective:  Filed Vitals:   05/20/14 0829  BP: 130/60  Pulse: 62  Temp: 97.7 F (36.5 C)  Resp: 14     General appearance: alert, cooperative and appears stated age Ears: normal TM's and external ear canals both ears Throat: lips, mucosa, and tongue normal; teeth and gums normal Neck: no adenopathy, no carotid bruit, supple, symmetrical, trachea midline and thyroid not enlarged, symmetric, no tenderness/mass/nodules Back: symmetric, no curvature. ROM normal. No CVA tenderness. Lungs: clear to auscultation bilaterally Heart:  regular rate and rhythm, S1, S2 normal, no murmur, click, rub or gallop Abdomen: soft, non-tender; bowel sounds normal; no masses,  no organomegaly Pulses: 2+ and symmetric Skin: Skin color, texture, turgor normal. No rashes or lesions Lymph nodes: Cervical, supraclavicular, and axillary nodes normal. MSK:  Right buttock pain   Assessment and Plan:  Right-sided low back pain without sciatica Persistent, with history of lumbar spinal stenosis.  Addng vicodin and repeat prolonged steroid taper. MRI will be repeated if no improvement in 2-3 weeks.    Updated Medication List Outpatient Encounter Prescriptions as of 05/20/2014  Medication Sig  . alendronate (FOSAMAX) 70 MG tablet TAKE 1 TABLET  EVERY 7  DAYS  WITH A FULL GLASS OF WATER ON AN EMPTY STOMACH  . amoxicillin (AMOXIL) 500 MG capsule 4 tablets 1 hour prior to procedure  . aspirin 81 MG tablet Take 81 mg by mouth daily.    . Cholecalciferol (VITAMIN D3) 1000 UNITS CAPS Take by mouth.    . diltiazem (CARDIZEM) 30 MG tablet Take 1 tablet (30 mg total) by mouth daily as needed.  [name] levothyroxine (SYNTHROID, LEVOTHROID) 88 MCG tablet TAKE 1 TABLET EVERY DAY  . losartan (COZAAR) 100 MG tablet TAKE 1 TABLET EVERY DAY  . meloxicam (MOBIC) 15 MG tablet Take 7.5 mg by mouth daily.   [name] HYDROcodone-acetaminophen (NORCO/VICODIN) 5-325 MG per tablet Take 1 tablet by mouth every 6 (six) hours as needed for moderate pain.  . predniSONE (DELTASONE) 20 MG tablet 3 tablets daily for one week,  Then start the prednisone taper  . predniSONE (STERAPRED UNI-PAK) 10 MG tablet 6 tablets on Day 1 , then reduce by 1 tablet daily until gone  . [DISCONTINUED] methylPREDNIsolone (MEDROL DOSPACK) 4 MG tablet follow package directions     No orders of the defined types were placed in this encounter.    No Follow-up on file.

## 2014-05-21 NOTE — Assessment & Plan Note (Addendum)
Persistent, with history of lumbar spinal stenosis.  Addng vicodin and repeat prolonged steroid taper. MRI will be repeated if no improvement in 2-3 weeks.

## 2014-06-02 ENCOUNTER — Other Ambulatory Visit: Payer: Self-pay | Admitting: Internal Medicine

## 2014-06-14 ENCOUNTER — Telehealth: Payer: Self-pay | Admitting: *Deleted

## 2014-06-14 ENCOUNTER — Telehealth: Payer: Self-pay | Admitting: Internal Medicine

## 2014-06-14 NOTE — Telephone Encounter (Signed)
Nurse Assessment Nurse: Mallie Mussel, RN, Alveta Heimlich Date/Time Eilene Ghazi Time): 06/14/2014 9:15:29 AM Confirm and document reason for call. If symptomatic, describe symptoms. ---Caller states that she has had a fever x 3 days. Temp is 99.8 orally. No cough, runny nose, vomiting and fever. Denies difficulty breathing. She has elevated BP of 162/73. She has been doing PT for her back recently. She does have a headache. She rates the pain as 5 on 0-10 scale. Denies weakness and numbness. Has the patient traveled out of the country within the last 30 days? ---No Does the patient require triage? ---Yes Related visit to physician within the last 2 weeks? ---No Does the PT have any chronic conditions? (i.e. diabetes, asthma, etc.) ---Yes List chronic conditions. ---HTN Guidelines Guideline Title Affirmed Question Affirmed Notes Nurse Date/Time (Eastern Time) High Blood Pressure [1] BP # 130/80 AND [2] history of heart problems, kidney disease or diabetes A Flutter  Mallie Mussel, RN, Alveta Heimlich 06/14/2014 9:18:29 AM Fever Fever present > 3 days (72 hours) Mallie Mussel, RN, Alveta Heimlich 06/14/2014 9:21:51 AM Disp. Time Eilene Ghazi Time) Disposition Final User 06/14/2014 9:21:23 AM See PCP within [place], RN, Alveta Heimlich 06/14/2014 9:24:05 AM See Physician within 24 Hours Yes Mallie Mussel, RN, Ola Spurr Understands: Yes Disagree/Comply: Helyn Numbers Understands: Yes Disagree/Comply: Comply Care Advice Given Per Guideline SEE PCP WITHIN 2 WEEKS: You need an evaluation for this ongoing problem within the next 2 weeks. Call your doctor during regular office hours and make an appointment. * Weakness or numbness of the face, arm or leg on one side of the body occurs * Difficulty walking, difficulty talking, or severe headache occurs * Chest pain or difficulty breathing occurs * Your blood pressure is over 160/100 * You become worse. SEE PHYSICIAN WITHIN 24 HOURS: * Drink cold fluids to prevent dehydration. * Dress in 1 layer of lightweight  clothing and sleep with 1 light blanket. * For fevers less than 101 F (38.3 C), fever medicines are usually not necessary. ACETAMINOPHEN (E.G., TYLENOL): IBUPROFEN (E.G., MOTRIN, ADVIL): * Do not take nonsteroidal anti-inflammatory drugs (NSAIDs) if you have stomach problems, kidney disease, heart failure, or other contraindications to using this type of medication. * Do not take NSAID medications for over 7 days without consulting your PCP. * GASTROINTESTINAL RISK: There is an increased risk of stomach ulcers, GI bleeding, perforation. * CARDIOVASCULAR RISK: There may be an increased risk of heart attack and stroke. * You become worse.

## 2014-06-14 NOTE — Telephone Encounter (Signed)
Pt appt scheduled for tomorrow with Morey Hummingbird for following complaint per triage nurse: Caller states that she has had a fever x 3 days. Temp is 99.8 orally. No cough, runny nose, vomiting and fever. Denies difficulty breathing. She has elevated BP of 162/73. She has been doing PT for her back recently. She does have a headache. She rates the pain as 5 on 0-10 scale. Denies weakness and numbness.  FYI.

## 2014-06-14 NOTE — Telephone Encounter (Signed)
Patient Name: Ana Cruz  DOB: Oct 15, 1933    Nurse Assessment  Nurse: Mallie Mussel, RN, Alveta Heimlich Date/Time Eilene Ghazi Time): 06/14/2014 9:15:29 AM  Confirm and document reason for call. If symptomatic, describe symptoms. ---Caller states that she has had a fever x 3 days. Temp is 99.8 orally. No cough, runny nose, vomiting and fever. Denies difficulty breathing. She has elevated BP of 162/73. She has been doing PT for her back recently. She does have a headache. She rates the pain as 5 on 0-10 scale. Denies weakness and numbness.  Has the patient traveled out of the country within the last 30 days? ---No  Does the patient require triage? ---Yes  Related visit to physician within the last 2 weeks? ---No  Does the PT have any chronic conditions? (i.e. diabetes, asthma, etc.) ---Yes  List chronic conditions. ---HTN     Guidelines    Guideline Title Affirmed Question Affirmed Notes  High Blood Pressure [1] BP ? 130/80 AND [2] history of heart problems, kidney disease or diabetes A Flutter  Fever Fever present > 3 days (72 hours)    Final Disposition User   See Physician within [place], Therapist, sports, [employer]

## 2014-06-14 NOTE — Telephone Encounter (Signed)
Duplicate, see other telephone encounter.

## 2014-06-14 NOTE — Telephone Encounter (Signed)
Thanks

## 2014-06-15 ENCOUNTER — Encounter: Payer: Self-pay | Admitting: Internal Medicine

## 2014-06-15 ENCOUNTER — Encounter: Payer: Self-pay | Admitting: Nurse Practitioner

## 2014-06-15 ENCOUNTER — Ambulatory Visit (INDEPENDENT_AMBULATORY_CARE_PROVIDER_SITE_OTHER): Payer: Medicare Other | Admitting: Nurse Practitioner

## 2014-06-15 VITALS — BP 148/70 | HR 80 | Temp 99.2°F | Resp 14 | Ht 63.0 in | Wt 173.8 lb

## 2014-06-15 DIAGNOSIS — M545 Low back pain, unspecified: Secondary | ICD-10-CM

## 2014-06-15 DIAGNOSIS — R509 Fever, unspecified: Secondary | ICD-10-CM

## 2014-06-15 LAB — COMPREHENSIVE METABOLIC PANEL
ALT: 20 U/L (ref 0–35)
AST: 22 U/L (ref 0–37)
Albumin: 3.7 g/dL (ref 3.5–5.2)
Alkaline Phosphatase: 110 U/L (ref 39–117)
BILIRUBIN TOTAL: 0.4 mg/dL (ref 0.2–1.2)
BUN: 27 mg/dL — ABNORMAL HIGH (ref 6–23)
CALCIUM: 9.6 mg/dL (ref 8.4–10.5)
CHLORIDE: 102 meq/L (ref 96–112)
CO2: 27 mEq/L (ref 19–32)
CREATININE: 1.08 mg/dL (ref 0.40–1.20)
GFR: 52.03 mL/min — AB (ref >60.00)
Glucose, Bld: 87 mg/dL (ref 70–99)
POTASSIUM: 4.6 meq/L (ref 3.5–5.1)
Sodium: 137 mEq/L (ref 135–145)
Total Protein: 7.5 g/dL (ref 6.0–8.3)

## 2014-06-15 LAB — CBC WITH DIFFERENTIAL/PLATELET
BASOS PCT: 0.3 % (ref 0.0–3.0)
Basophils Absolute: 0 10*3/uL (ref 0.0–0.1)
EOS PCT: 5.7 % — AB (ref 0.0–5.0)
Eosinophils Absolute: 0.4 10*3/uL (ref 0.0–0.7)
HCT: 37.3 % (ref 36.0–46.0)
HEMOGLOBIN: 12.1 g/dL (ref 12.0–15.0)
Lymphocytes Relative: 14.7 % (ref 12.0–46.0)
Lymphs Abs: 1.1 10*3/uL (ref 0.7–4.0)
MCHC: 32.5 g/dL (ref 30.0–36.0)
MCV: 92.3 fl (ref 78.0–100.0)
MONO ABS: 0.8 10*3/uL (ref 0.1–1.0)
Monocytes Relative: 10.8 % (ref 3.0–12.0)
NEUTROS PCT: 68.5 % (ref 43.0–77.0)
Neutro Abs: 5.2 10*3/uL (ref 1.4–7.7)
Platelets: 369 10*3/uL (ref 150.0–400.0)
RBC: 4.05 Mil/uL (ref 3.87–5.11)
RDW: 14.1 % (ref 11.5–15.5)
WBC: 7.6 10*3/uL (ref 4.0–10.5)

## 2014-06-15 NOTE — Progress Notes (Signed)
Subjective:    Patient ID: Ana Cruz, female    DOB: 03-Aug-1933, 81 y.o.   MRN: [id number]  HPI  [name] is a 81 yo female with a CC of fever x 4 days.   1) Called triage with temp 99.8, when at PT for back   HA in back of head and frontal sinus x 4 days 100.9 other day and 100.6 last night.  Range- 50-83 pulse  130/58-166/86 Atrial flutter- 2.45 min on 12/21 05/28/14- A flutter 5 hours 4 tabs of amoxicillin before dentist in 10 or 11/15, needs for dental work this month also.   Review of Systems  Constitutional: Positive for fever, chills, diaphoresis and fatigue.  Eyes: Negative for visual disturbance.  Respiratory: Negative for cough, chest tightness, shortness of breath and wheezing.   Cardiovascular: Positive for palpitations. Negative for chest pain and leg swelling.       05/28/14  Gastrointestinal: Negative for nausea, vomiting and diarrhea.  Genitourinary: Positive for frequency. Negative for dysuria.  Musculoskeletal: Positive for back pain and arthralgias. Negative for myalgias, neck pain and neck stiffness.  Skin: Negative for rash.  Neurological: Positive for weakness and light-headedness.       Not impeding life; no energy   Past Medical History  Diagnosis Date  . Spinal stenosis     has L5 disk herniation with nerve root displacement  . Elbow fracture, left     repaired Jan 2000  . Atrial fibrillation   . hypothyroidism     medicated since age 78, no prior surgery  . Chronic kidney disease, stage I     mild, did improve with cessation of NSAIDs  . Hypertension   . allergic rhinitis   . Hyperlipidemia     History   Social History  . Marital Status: Married    Spouse Name: N/A    Number of Children: N/A  . Years of Education: N/A   Occupational History  . Not on file.   Social History Main Topics  . Smoking status: Never Smoker   . Smokeless tobacco: Never Used  . Alcohol Use: Yes  . Drug Use: No  . Sexual Activity: Not  on file   Other Topics Concern  . Not on file   Social History Narrative    Past Surgical History  Procedure Laterality Date  . Rotator cuff repair  Jan 2000  . Ovarian cyst removal  April 1969  . Bladder repair  1980  . Rotator cuff repair  2000  . Replacement total knee bilateral  2009  . Abdominal hysterectomy  1979  . Appendectomy  1969  . Bilateral oophorectomy      squential    Family History  Problem Relation Age of Onset  . Hypertension Mother   . Heart disease Father 80    MI  . Cancer Paternal Grandfather 71    colon    Allergies  Allergen Reactions  . Chocolate Flavor Hives  . Clindamycin/Lincomycin     Tongue swelled,  Throat felt funny    Current Outpatient Prescriptions on File Prior to Visit  Medication Sig Dispense Refill  . alendronate (FOSAMAX) 70 MG tablet TAKE 1 TABLET  EVERY 7  DAYS  WITH A FULL GLASS OF WATER ON AN EMPTY STOMACH 12 tablet 3  . aspirin 81 MG tablet Take 81 mg by mouth daily.      . Cholecalciferol (VITAMIN D3) 1000 UNITS CAPS Take by mouth.      [name]  diltiazem (CARDIZEM) 30 MG tablet Take 1 tablet (30 mg total) by mouth daily as needed. 30 tablet 4  . HYDROcodone-acetaminophen (NORCO/VICODIN) 5-325 MG per tablet Take 1 tablet by mouth every 6 (six) hours as needed for moderate pain. 120 tablet 0  . levothyroxine (SYNTHROID, LEVOTHROID) 88 MCG tablet TAKE 1 TABLET EVERY DAY 90 tablet 2  . losartan (COZAAR) 100 MG tablet TAKE 1 TABLET EVERY DAY 90 tablet 1  . meloxicam (MOBIC) 15 MG tablet Take 7.5 mg by mouth daily.      No current facility-administered medications on file prior to visit.      Objective:   Physical Exam  Constitutional: She is oriented to person, place, and time. She appears well-developed and well-nourished. No distress.  HENT:  Head: Normocephalic and atraumatic.  Right Ear: External ear normal.  Left Ear: External ear normal.  Eyes: Conjunctivae and EOM are normal. Pupils are equal, round, and reactive to  light. Right eye exhibits no discharge. Left eye exhibits no discharge. No scleral icterus.  Cardiovascular: Normal rate, regular rhythm, normal heart sounds and intact distal pulses.  Exam reveals no gallop and no friction rub.   No murmur heard. Pulmonary/Chest: Effort normal and breath sounds normal. No respiratory distress. She has no wheezes. She has no rales. She exhibits no tenderness.  Neurological: She is alert and oriented to person, place, and time. No cranial nerve deficit. She exhibits normal muscle tone. Coordination normal.  Skin: Skin is warm and dry. No rash noted. She is not diaphoretic.  Psychiatric: She has a normal mood and affect. Her behavior is normal. Judgment and thought content normal.   BP 148/70 mmHg  Pulse 80  Temp(Src) 99.2 F (37.3 C) (Oral)  Resp 14  Ht 5\' 3"  (1.6 m)  Wt 173 lb 12.8 oz (78.835 kg)  BMI 30.79 kg/m2  SpO2 96%     Assessment & Plan:

## 2014-06-15 NOTE — Progress Notes (Signed)
Pre visit review using our clinic review tool, if applicable. No additional management support is needed unless otherwise documented below in the visit note. 

## 2014-06-15 NOTE — Patient Instructions (Signed)
We will call you with Lab results.

## 2014-06-16 ENCOUNTER — Telehealth: Payer: Self-pay | Admitting: *Deleted

## 2014-06-16 ENCOUNTER — Other Ambulatory Visit: Payer: Self-pay | Admitting: Nurse Practitioner

## 2014-06-16 ENCOUNTER — Other Ambulatory Visit (INDEPENDENT_AMBULATORY_CARE_PROVIDER_SITE_OTHER): Payer: Medicare Other

## 2014-06-16 DIAGNOSIS — R5081 Fever presenting with conditions classified elsewhere: Secondary | ICD-10-CM

## 2014-06-16 LAB — C-REACTIVE PROTEIN: CRP: 4.7 mg/dL (ref 0.5–20.0)

## 2014-06-16 LAB — SEDIMENTATION RATE: Sed Rate: 91 mm/hr — ABNORMAL HIGH (ref 0–22)

## 2014-06-16 NOTE — Telephone Encounter (Signed)
Labs and dx?  

## 2014-06-16 NOTE — Telephone Encounter (Signed)
I'm SO sorry, I was looking at this last night when I was tired and forgot to put orders in. I have placed them. Has she already come?

## 2014-06-16 NOTE — Telephone Encounter (Signed)
Morey Hummingbird,  Your orders are needed

## 2014-06-16 NOTE — Telephone Encounter (Signed)
Yes she has already came in, when ordered are not put in i just draw the standard tube.

## 2014-06-17 ENCOUNTER — Other Ambulatory Visit: Payer: Self-pay | Admitting: Nurse Practitioner

## 2014-06-17 ENCOUNTER — Telehealth: Payer: Self-pay | Admitting: Internal Medicine

## 2014-06-17 ENCOUNTER — Other Ambulatory Visit: Payer: Self-pay

## 2014-06-17 ENCOUNTER — Emergency Department: Payer: Self-pay | Admitting: Emergency Medicine

## 2014-06-17 DIAGNOSIS — R509 Fever, unspecified: Secondary | ICD-10-CM | POA: Insufficient documentation

## 2014-06-17 DIAGNOSIS — R700 Elevated erythrocyte sedimentation rate: Secondary | ICD-10-CM

## 2014-06-17 LAB — COMPREHENSIVE METABOLIC PANEL
ALBUMIN: 3 g/dL — AB (ref 3.4–5.0)
AST: 27 U/L (ref 15–37)
Alkaline Phosphatase: 125 U/L — ABNORMAL HIGH
Anion Gap: 9 (ref 7–16)
BUN: 31 mg/dL — ABNORMAL HIGH (ref 7–18)
Bilirubin,Total: 0.2 mg/dL (ref 0.2–1.0)
CALCIUM: 9 mg/dL (ref 8.5–10.1)
CO2: 23 mmol/L (ref 21–32)
Chloride: 105 mmol/L (ref 98–107)
Creatinine: 1.37 mg/dL — ABNORMAL HIGH (ref 0.60–1.30)
EGFR (African American): 48 — ABNORMAL LOW
GFR CALC NON AF AMER: 40 — AB
GLUCOSE: 131 mg/dL — AB (ref 65–99)
OSMOLALITY: 282 (ref 275–301)
Potassium: 4.1 mmol/L (ref 3.5–5.1)
SGPT (ALT): 28 U/L
SODIUM: 137 mmol/L (ref 136–145)
Total Protein: 7.2 g/dL (ref 6.4–8.2)

## 2014-06-17 LAB — CBC WITH DIFFERENTIAL/PLATELET
BASOS PCT: 0.4 %
Basophil #: 0 10*3/uL (ref 0.0–0.1)
EOS ABS: 0.3 10*3/uL (ref 0.0–0.7)
Eosinophil %: 4.8 %
HCT: 35.5 % (ref 35.0–47.0)
HGB: 11.5 g/dL — ABNORMAL LOW (ref 12.0–16.0)
Lymphocyte #: 0.8 10*3/uL — ABNORMAL LOW (ref 1.0–3.6)
Lymphocyte %: 13.2 %
MCH: 30.3 pg (ref 26.0–34.0)
MCHC: 32.5 g/dL (ref 32.0–36.0)
MCV: 93 fL (ref 80–100)
MONOS PCT: 13.7 %
Monocyte #: 0.8 x10 3/mm (ref 0.2–0.9)
NEUTROS ABS: 4 10*3/uL (ref 1.4–6.5)
Neutrophil %: 67.9 %
Platelet: 403 10*3/uL (ref 150–440)
RBC: 3.8 10*6/uL (ref 3.80–5.20)
RDW: 14 % (ref 11.5–14.5)
WBC: 5.9 10*3/uL (ref 3.6–11.0)

## 2014-06-17 MED ORDER — PREDNISONE 10 MG PO TABS: ORAL_TABLET | ORAL | Status: DC

## 2014-06-17 MED ORDER — PREDNISONE 10 MG PO TABS: 10.0000 mg | ORAL_TABLET | Freq: Once | ORAL | Status: DC

## 2014-06-17 NOTE — Telephone Encounter (Signed)
[facility] Patient Name: Ana Cruz DOB: [birth year] Nurse Assessment Nurse: Markus Daft, RN, Sherre Poot Date/Time (Eastern Time): 06/17/2014 1:39:16 PM Confirm and document reason for call. If symptomatic, describe symptoms. ---Caller states she has fever of 101.1 by mouth. Has been as high as 101.4 by mouth today. Has had a fever for the last week. She has a HA on back of head and frontal lobe area also (rates HA pain 5/10). Achy all over. Fatigued. 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 BP 160/80, HR 72 bpm this AM. -- Seen in office on Wednesday and told if she felt worse or if have fever to call back. She's had 3 rounds of prednisone and one waiting at the pharmacy now. Has the patient traveled out of the country within the last 30 days? ---Not Applicable Does the patient require triage? ---Yes Related visit to physician within the last 2 weeks? ---Yes Does the PT have any chronic conditions? (i.e. diabetes, asthma, etc.) ---Yes List chronic conditions. ---Atrial flutter, and HTN, Hypothyroid, lower back problem - treated with steroids at end of December, and also Physical Therapy Guidelines Guideline Title Affirmed Question Affirmed Notes Headache [1] New headache AND [2] age > 46 Final Disposition User See Physician within [place], [place], [place]       Dr. Deborra Medina 06/17/2014 2:14:20 PM Spoke with On Call - Merrilee Seashore NP notified, and advised that she go on to ER. - Caller notified, and verb. understanding.

## 2014-06-17 NOTE — Telephone Encounter (Signed)
Per carrie doss, np directions should state Take 6 tablets by mouth once.

## 2014-06-17 NOTE — Progress Notes (Signed)
Noted  

## 2014-06-17 NOTE — Assessment & Plan Note (Signed)
Stable. Fever of Unknown origin. Will obtain CMET and CBC w/ diff.   Update 06/16/14- results from labs normal. Pt to get ESR and CRP. 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   Update 06/17/14- Sed rate below. CRP- normal Lab Results  Component Value Date   ESRSEDRATE 91* 06/16/2014  Will refer to General Surgeon Dr. Tamala Julian at [facility] for further evaluation and treatment. Called in one time dose of 60 mg of prednisone.

## 2014-06-17 NOTE — Telephone Encounter (Signed)
[facility] spoke to Lorane Gell, NP and was advised to go to ED. Juluis Rainier.

## 2014-06-17 NOTE — Assessment & Plan Note (Signed)
Pt is in PT and doing well. Would like to start back when feeling better.

## 2014-06-18 ENCOUNTER — Encounter: Payer: Self-pay | Admitting: Nurse Practitioner

## 2014-06-21 ENCOUNTER — Encounter: Payer: Self-pay | Admitting: Nurse Practitioner

## 2014-06-21 ENCOUNTER — Other Ambulatory Visit: Payer: Self-pay | Admitting: Nurse Practitioner

## 2014-06-21 ENCOUNTER — Telehealth: Payer: Self-pay

## 2014-06-21 DIAGNOSIS — R509 Fever, unspecified: Secondary | ICD-10-CM

## 2014-06-21 NOTE — Telephone Encounter (Signed)
Called Dr. Thompson Caul office and notified Olegario Shearer you are putting in a referral.  She verbalized understanding.

## 2014-06-21 NOTE — Telephone Encounter (Signed)
Voice message left from [facility] in the office of Dr. Tamala Julian at [facility].  Message stated, "Dr. Tamala Julian believes patient would benefit from seeing a Neurologist."  Ana Cruz would like to know if we are able to write a referral for Neurology consult first.  Dr. Thompson Caul office [phone].  Please advise.

## 2014-06-21 NOTE — Telephone Encounter (Signed)
I will place neurology referral

## 2014-06-28 ENCOUNTER — Ambulatory Visit (INDEPENDENT_AMBULATORY_CARE_PROVIDER_SITE_OTHER): Payer: Medicare Other | Admitting: Internal Medicine

## 2014-06-28 VITALS — BP 118/68 | HR 62 | Resp 12 | Ht 63.0 in | Wt 175.0 lb

## 2014-06-28 DIAGNOSIS — T732XXA Exhaustion due to exposure, initial encounter: Secondary | ICD-10-CM

## 2014-06-28 DIAGNOSIS — J069 Acute upper respiratory infection, unspecified: Secondary | ICD-10-CM

## 2014-06-28 DIAGNOSIS — R5383 Other fatigue: Secondary | ICD-10-CM

## 2014-06-28 MED ORDER — CYANOCOBALAMIN 1000 MCG/ML IJ SOLN
1000.0000 ug | Freq: Once | INTRAMUSCULAR | Status: AC
  Administered 2014-06-28: 1000 ug via INTRAMUSCULAR

## 2014-06-28 MED ORDER — CIPROFLOXACIN HCL 250 MG PO TABS: 250.0000 mg | ORAL_TABLET | Freq: Two times a day (BID) | ORAL | Status: DC

## 2014-06-28 MED ORDER — PROMETHAZINE HCL 12.5 MG PO TABS: 12.5000 mg | ORAL_TABLET | Freq: Three times a day (TID) | ORAL | Status: DC | PRN

## 2014-06-28 NOTE — Patient Instructions (Signed)
I am giving you a prescription for cipro and for nausea  To take with you on your cruise  Please take a probiotic ( Align, Floraque or Culturelle) while you are on the antibiotic to prevent a serious antibiotic associated diarrhea  Called clostirudium dificile colitis and a vaginal yeast infection   If the B12 injection gives you a boost of energy,  We can continue these for a while weekly

## 2014-06-28 NOTE — Progress Notes (Signed)
Pre visit review using our clinic review tool, if applicable. No additional management support is needed unless otherwise documented below in the visit note. 

## 2014-06-28 NOTE — Progress Notes (Signed)
Patient ID: Ana Cruz, female   DOB: 1933-09-09, 81 y.o.   MRN: [id number]  Patient Active Problem List   Diagnosis Date Noted  . Fatigue 06/29/2014  . Viral URI 06/29/2014  . Elevated temperature 06/17/2014  . Right-sided low back pain without sciatica 05/11/2014  . Obesity, unspecified 08/02/2013  . Encounter for preventive health examination 08/02/2013  . Screening for colon cancer 01/19/2013  . Unspecified hypothyroidism 01/19/2013  . Drug allergy, antibiotic 01/19/2013  . Routine general medical examination at a health care facility 07/28/2012  . Chronic left shoulder pain 04/09/2012  . Cataract extraction status of eye 10/07/2011  . Osteopenia 05/22/2011  . Spinal stenosis of lumbar region 04/10/2011  . Screening for cervical cancer 04/10/2011  . Chronic kidney disease, stage I   . Hypertension   . SVT (supraventricular tachycardia)   . Hyperlipidemia   . Screening for breast cancer 04/09/2011    Subjective:  CC:   Chief Complaint  Patient presents with  . Discuss progress with PT    HPI:   Ana Cruz is a 81 y.o. female who presents for   Follow up on recent viral illness with headache. Patient was seen several times by Lorane Gell for same.  Symptoms.  Thought initially to have temporal arteritis  But symptoms spontaneously resolved.  Her headache and fevers have resolved but she still feels weak and tired   Past Medical History  Diagnosis Date  . Spinal stenosis     has L5 disk herniation with nerve root displacement  . Elbow fracture, left     repaired Jan 2000  . Atrial fibrillation   . hypothyroidism     medicated since age 81, no prior surgery  . Chronic kidney disease, stage I     mild, did improve with cessation of NSAIDs  . Hypertension   . allergic rhinitis   . Hyperlipidemia     Past Surgical History  Procedure Laterality Date  . Rotator cuff repair  Jan 2000  . Ovarian cyst removal  April 1969  . Bladder repair  1980   . Rotator cuff repair  2000  . Replacement total knee bilateral  2009  . Abdominal hysterectomy  1979  . Appendectomy  1969  . Bilateral oophorectomy      squential       The following portions of the patient's history were reviewed and updated as appropriate: Allergies, current medications, and problem list.    Review of Systems:   Patient denies headache, fevers, malaise, unintentional weight loss, skin rash, eye pain, sinus congestion and sinus pain, sore throat, dysphagia,  hemoptysis , cough, dyspnea, wheezing, chest pain, palpitations, orthopnea, edema, abdominal pain, nausea, melena, diarrhea, constipation, flank pain, dysuria, hematuria, urinary  Frequency, nocturia, numbness, tingling, seizures,  Focal weakness, Loss of consciousness,  Tremor, insomnia, depression, anxiety, and suicidal ideation.     History   Social History  . Marital Status: Married    Spouse Name: N/A    Number of Children: N/A  . Years of Education: N/A   Occupational History  . Not on file.   Social History Main Topics  . Smoking status: Never Smoker   . Smokeless tobacco: Never Used  . Alcohol Use: Yes  . Drug Use: No  . Sexual Activity: Not on file   Other Topics Concern  . Not on file   Social History Narrative    Objective:  Filed Vitals:   06/28/14 1505  BP: 118/68  Pulse: 62  Resp: 12     General appearance: alert, cooperative and appears stated age Ears: normal TM's and external ear canals both ears Throat: lips, mucosa, and tongue normal; teeth and gums normal Neck: no adenopathy, no carotid bruit, supple, symmetrical, trachea midline and thyroid not enlarged, symmetric, no tenderness/mass/nodules Back: symmetric, no curvature. ROM normal. No CVA tenderness. Lungs: clear to auscultation bilaterally Heart: regular rate and rhythm, S1, S2 normal, no murmur, click, rub or gallop Abdomen: soft, non-tender; bowel sounds normal; no masses,  no organomegaly Pulses: 2+ and  symmetric Skin: Skin color, texture, turgor normal. No rashes or lesions Lymph nodes: Cervical, supraclavicular, and axillary nodes normal.  Assessment and Plan:  Fatigue Trial of B12 injection     Viral URI With fevers and headaches ,  Now resolved.     Updated Medication List Outpatient Encounter Prescriptions as of 06/28/2014  Medication Sig  . alendronate (FOSAMAX) 70 MG tablet TAKE 1 TABLET  EVERY 7  DAYS  WITH A FULL GLASS OF WATER ON AN EMPTY STOMACH  . aspirin 81 MG tablet Take 81 mg by mouth daily.    . Cholecalciferol (VITAMIN D3) 1000 UNITS CAPS Take by mouth.    . diltiazem (CARDIZEM) 30 MG tablet Take 1 tablet (30 mg total) by mouth daily as needed.  [name] levothyroxine (SYNTHROID, LEVOTHROID) 88 MCG tablet TAKE 1 TABLET EVERY DAY  . losartan (COZAAR) 100 MG tablet TAKE 1 TABLET EVERY DAY  . meloxicam (MOBIC) 15 MG tablet Take 7.5 mg by mouth daily.   . ciprofloxacin (CIPRO) 250 MG tablet Take 1 tablet (250 mg total) by mouth 2 (two) times daily.  . promethazine (PHENERGAN) 12.5 MG tablet Take 1 tablet (12.5 mg total) by mouth every 8 (eight) hours as needed for nausea or vomiting.  . [DISCONTINUED] HYDROcodone-acetaminophen (NORCO/VICODIN) 5-325 MG per tablet Take 1 tablet by mouth every 6 (six) hours as needed for moderate pain.  . [DISCONTINUED] predniSONE (DELTASONE) 10 MG tablet Take 6 tablets (60 mg) by mouth once.  . [EXPIRED] cyanocobalamin ((VITAMIN B-12)) injection 1,000 mcg      No orders of the defined types were placed in this encounter.    No Follow-up on file.      [name]

## 2014-06-29 DIAGNOSIS — R5383 Other fatigue: Secondary | ICD-10-CM | POA: Insufficient documentation

## 2014-06-29 DIAGNOSIS — J069 Acute upper respiratory infection, unspecified: Secondary | ICD-10-CM | POA: Insufficient documentation

## 2014-06-29 NOTE — Assessment & Plan Note (Signed)
Trial of B12 injection

## 2014-06-29 NOTE — Assessment & Plan Note (Signed)
With fevers and headaches ,  Now resolved.

## 2014-07-05 ENCOUNTER — Ambulatory Visit (INDEPENDENT_AMBULATORY_CARE_PROVIDER_SITE_OTHER): Payer: Medicare Other | Admitting: *Deleted

## 2014-07-05 DIAGNOSIS — R5383 Other fatigue: Secondary | ICD-10-CM

## 2014-07-05 MED ORDER — CYANOCOBALAMIN 1000 MCG/ML IJ SOLN
1000.0000 ug | Freq: Once | INTRAMUSCULAR | Status: AC
  Administered 2014-07-05: 1000 ug via INTRAMUSCULAR

## 2014-07-12 ENCOUNTER — Ambulatory Visit (INDEPENDENT_AMBULATORY_CARE_PROVIDER_SITE_OTHER): Payer: Medicare Other | Admitting: *Deleted

## 2014-07-12 DIAGNOSIS — E538 Deficiency of other specified B group vitamins: Secondary | ICD-10-CM

## 2014-07-12 MED ORDER — CYANOCOBALAMIN 1000 MCG/ML IJ SOLN
1000.0000 ug | Freq: Once | INTRAMUSCULAR | Status: AC
  Administered 2014-07-12: 1000 ug via INTRAMUSCULAR

## 2014-07-13 ENCOUNTER — Ambulatory Visit (INDEPENDENT_AMBULATORY_CARE_PROVIDER_SITE_OTHER): Payer: Medicare Other | Admitting: Podiatry

## 2014-07-13 VITALS — BP 134/69 | HR 76 | Resp 16 | Ht 63.0 in | Wt 175.0 lb

## 2014-07-13 DIAGNOSIS — L600 Ingrowing nail: Secondary | ICD-10-CM | POA: Diagnosis not present

## 2014-07-13 MED ORDER — NEOMYCIN-POLYMYXIN-HC 3.5-10000-1 OT SOLN: OTIC | Status: DC

## 2014-07-13 NOTE — Patient Instructions (Signed)

## 2014-07-13 NOTE — Progress Notes (Signed)
She presents today with a chief complaint of a painful ingrown toenail fibular border of the hallux left. She states this been this way for the past few weeks and seems to be getting worse.  Objective: Vital signs are stable she is alert and oriented 3 pulses are palpable. Lateral border of the hallux left does demonstrate an incurvated toenail with erythema and edema gross granulation tissue to the distal lateral aspect of the fibular border.  Assessment: Ingrown nail. his hallux left.  Plan: Permanent matrixectomy fibular border of the hallux left after local anesthesia was achieved. She tolerated this procedure well. She will start soaking in Epsom salts and warm water on twice-daily basis and will apply Cortisporin otic twice daily after soaking. She was a cover with a Band-Aid. I will follow-up with her in 1-2 weeks. We may need to consider debriding her nails for her.

## 2014-07-19 ENCOUNTER — Ambulatory Visit (INDEPENDENT_AMBULATORY_CARE_PROVIDER_SITE_OTHER): Payer: Medicare Other | Admitting: *Deleted

## 2014-07-19 DIAGNOSIS — R5383 Other fatigue: Secondary | ICD-10-CM

## 2014-07-19 MED ORDER — CYANOCOBALAMIN 1000 MCG/ML IJ SOLN
1000.0000 ug | Freq: Once | INTRAMUSCULAR | Status: AC
  Administered 2014-07-19: 1000 ug via INTRAMUSCULAR

## 2014-07-20 ENCOUNTER — Ambulatory Visit (INDEPENDENT_AMBULATORY_CARE_PROVIDER_SITE_OTHER): Payer: Medicare Other | Admitting: Podiatry

## 2014-07-20 DIAGNOSIS — L600 Ingrowing nail: Secondary | ICD-10-CM

## 2014-07-20 NOTE — Progress Notes (Signed)
She presents today for follow-up of a matrixectomy hallux left. She denies fever chills nausea vomiting muscle aches and pains. Continues to soak twice daily in Betadine in warm water and apply Cortisporin otic as directed and cover.  Objective: Vital signs are stable she is alert and oriented 3 matrixectomy site of the left hallux does not demonstrate erythema and there is no edema no cellulitis mild serosanguineous drainage and no odor.  Assessment: Well healing surgical to hallux left.  Plan: Discontinue Betadine and start with Epsom salts and warm water soaks. She will continue soaks until completely resolved. She will apply Cortisporin otic twice daily and cover during the day and leave open at night time.

## 2014-08-15 ENCOUNTER — Encounter: Payer: Self-pay | Admitting: Internal Medicine

## 2014-08-15 ENCOUNTER — Ambulatory Visit (INDEPENDENT_AMBULATORY_CARE_PROVIDER_SITE_OTHER): Payer: Medicare Other | Admitting: Internal Medicine

## 2014-08-15 VITALS — BP 126/62 | HR 61 | Temp 97.9°F | Resp 14 | Ht 63.0 in | Wt 177.0 lb

## 2014-08-15 DIAGNOSIS — M4806 Spinal stenosis, lumbar region: Secondary | ICD-10-CM

## 2014-08-15 DIAGNOSIS — T732XXA Exhaustion due to exposure, initial encounter: Secondary | ICD-10-CM

## 2014-08-15 DIAGNOSIS — M48061 Spinal stenosis, lumbar region without neurogenic claudication: Secondary | ICD-10-CM

## 2014-08-15 DIAGNOSIS — N181 Chronic kidney disease, stage 1: Secondary | ICD-10-CM

## 2014-08-15 DIAGNOSIS — I471 Supraventricular tachycardia: Secondary | ICD-10-CM

## 2014-08-15 DIAGNOSIS — I10 Essential (primary) hypertension: Secondary | ICD-10-CM

## 2014-08-15 DIAGNOSIS — E034 Atrophy of thyroid (acquired): Secondary | ICD-10-CM

## 2014-08-15 DIAGNOSIS — E785 Hyperlipidemia, unspecified: Secondary | ICD-10-CM

## 2014-08-15 DIAGNOSIS — M8580 Other specified disorders of bone density and structure, unspecified site: Secondary | ICD-10-CM | POA: Diagnosis not present

## 2014-08-15 DIAGNOSIS — E038 Other specified hypothyroidism: Secondary | ICD-10-CM

## 2014-08-15 MED ORDER — LOSARTAN POTASSIUM 100 MG PO TABS: ORAL_TABLET | ORAL | Status: DC

## 2014-08-15 NOTE — Progress Notes (Signed)
Patient ID: Ana Cruz, female   DOB: 25-Jul-1933, 81 y.o.   MRN: [id number]   Patient Active Problem List   Diagnosis Date Noted  . Fatigue 06/29/2014  . Elevated temperature 06/17/2014  . Right-sided low back pain without sciatica 05/11/2014  . Obesity, unspecified 08/02/2013  . Encounter for preventive health examination 08/02/2013  . Screening for colon cancer 01/19/2013  . Hypothyroidism 01/19/2013  . Drug allergy, antibiotic 01/19/2013  . Routine general medical examination at a health care facility 07/28/2012  . Chronic left shoulder pain 04/09/2012  . Cataract extraction status of eye 10/07/2011  . Osteopenia 05/22/2011  . Spinal stenosis of lumbar region 04/10/2011  . Screening for cervical cancer 04/10/2011  . Chronic kidney disease, stage I   . Hypertension   . SVT (supraventricular tachycardia)   . Hyperlipidemia   . Screening for breast cancer 04/09/2011    Subjective:  CC:   Chief Complaint  Patient presents with  . Follow-up    6 month back pain and the physical therapy, and B 12    HPI:   Ana Cruz is a 81 y.o. female who presents for   One month follow up on febrile illness accompanied by headache.  She feels generally better but continues to feel more fatigued than previously , which has interfered with some activities but ot others.  She is particpating in an hourly water aerobics class twice  A week and during and feels great,  No dyspnea, or chest pain,  Not fatigued after the class.  Fatigue occurs later in the day,  Wakes up feeling rested.    No history of snoring, and there was no improvement with a trial of  b12 injections.      Past Medical History  Diagnosis Date  . Spinal stenosis     has L5 disk herniation with nerve root displacement  . Elbow fracture, left     repaired Jan 2000  . Atrial fibrillation   . hypothyroidism     medicated since age 22, no prior surgery  . Chronic kidney disease, stage I     mild, did  improve with cessation of NSAIDs  . Hypertension   . allergic rhinitis   . Hyperlipidemia     Past Surgical History  Procedure Laterality Date  . Rotator cuff repair  Jan 2000  . Ovarian cyst removal  April 1969  . Bladder repair  1980  . Rotator cuff repair  2000  . Replacement total knee bilateral  2009  . Abdominal hysterectomy  1979  . Appendectomy  1969  . Bilateral oophorectomy      squential       The following portions of the patient's history were reviewed and updated as appropriate: Allergies, current medications, and problem list.    Review of Systems:   Patient denies headache, fevers, malaise, unintentional weight loss, skin rash, eye pain, sinus congestion and sinus pain, sore throat, dysphagia,  hemoptysis , cough, dyspnea, wheezing, chest pain, palpitations, orthopnea, edema, abdominal pain, nausea, melena, diarrhea, constipation, flank pain, dysuria, hematuria, urinary  Frequency, nocturia, numbness, tingling, seizures,  Focal weakness, Loss of consciousness,  Tremor, insomnia, depression, anxiety, and suicidal ideation.     History   Social History  . Marital Status: Married    Spouse Name: N/A  . Number of Children: N/A  . Years of Education: N/A   Occupational History  . Not on file.   Social History Main Topics  . Smoking status: Never  Smoker   . Smokeless tobacco: Never Used  . Alcohol Use: Yes  . Drug Use: No  . Sexual Activity: Not on file   Other Topics Concern  . Not on file   Social History Narrative    Objective:  Filed Vitals:   08/15/14 0919  BP: 126/62  Pulse: 61  Temp: 97.9 F (36.6 C)  Resp: 14     General appearance: alert, cooperative and appears stated age Ears: normal TM's and external ear canals both ears Throat: lips, mucosa, and tongue normal; teeth and gums normal Neck: no adenopathy, no carotid bruit, supple, symmetrical, trachea midline and thyroid not enlarged, symmetric, no  tenderness/mass/nodules Back: symmetric, no curvature. ROM normal. No CVA tenderness. Lungs: clear to auscultation bilaterally Heart: regular rate and rhythm, S1, S2 normal, no murmur, click, rub or gallop Abdomen: soft, non-tender; bowel sounds normal; no masses,  no organomegaly Pulses: 2+ and symmetric Skin: Skin color, texture, turgor normal. No rashes or lesions Lymph nodes: Cervical, supraclavicular, and axillary nodes normal.  Assessment and Plan:  Hypertension Well controlled on current regimen. Renal function stable, no changes today.  Lab Results  Component Value Date   CREATININE 1.08 06/15/2014   Lab Results  Component Value Date   NA 137 06/15/2014   K 4.6 06/15/2014   CL 102 06/15/2014   CO2 27 06/15/2014      SVT (supraventricular tachycardia) Managed with prn Cardizem .  With no recent episodes.    Hypothyroidism .Thyroid function was  WNL on current dose.  Will recheck with fasting labs this week  Lab Results  Component Value Date   TSH 0.84 02/14/2014      Chronic kidney disease, stage I Renal function has returned to baseline with avoidance of NSAIDs.  She is on an ARB for control of hypertension,, and statin for control of hyperlipidemia.   Lab Results  Component Value Date   CREATININE 1.08 06/15/2014      Fatigue Diet and activities recviewed in detail, reassurance provided, and prior labs reviewed.  Will repeat thyroid function given recent prolonged febrile illness.     Updated Medication List Outpatient Encounter Prescriptions as of 08/15/2014  Medication Sig  . alendronate (FOSAMAX) 70 MG tablet TAKE 1 TABLET  EVERY 7  DAYS  WITH A FULL GLASS OF WATER ON AN EMPTY STOMACH  . aspirin 81 MG tablet Take 81 mg by mouth daily.    . Cholecalciferol (VITAMIN D3) 1000 UNITS CAPS Take by mouth.    . diltiazem (CARDIZEM) 30 MG tablet Take 1 tablet (30 mg total) by mouth daily as needed.  [name] levothyroxine (SYNTHROID, LEVOTHROID) 88 MCG tablet  TAKE 1 TABLET EVERY DAY  . losartan (COZAAR) 100 MG tablet TAKE 1 TABLET EVERY DAY  . meloxicam (MOBIC) 15 MG tablet Take 7.5 mg by mouth daily.   . [DISCONTINUED] losartan (COZAAR) 100 MG tablet TAKE 1 TABLET EVERY DAY  . [DISCONTINUED] ciprofloxacin (CIPRO) 250 MG tablet Take 1 tablet (250 mg total) by mouth 2 (two) times daily. (Patient not taking: Reported on 08/15/2014)  . [DISCONTINUED] neomycin-polymyxin-hydrocortisone (CORTISPORIN) otic solution 1-2 drops to the toe after soaking twice daily (Patient not taking: Reported on 08/15/2014)  . [DISCONTINUED] promethazine (PHENERGAN) 12.5 MG tablet Take 1 tablet (12.5 mg total) by mouth every 8 (eight) hours as needed for nausea or vomiting. (Patient not taking: Reported on 08/15/2014)     Orders Placed This Encounter  Procedures  . T4 AND TSH  . Lipid panel  No Follow-up on file.

## 2014-08-16 ENCOUNTER — Encounter: Payer: Self-pay | Admitting: Internal Medicine

## 2014-08-16 ENCOUNTER — Telehealth: Payer: Self-pay | Admitting: Internal Medicine

## 2014-08-16 NOTE — Assessment & Plan Note (Signed)
Well controlled on current regimen. Renal function stable, no changes today.  Lab Results  Component Value Date   CREATININE 1.08 06/15/2014   Lab Results  Component Value Date   NA 137 06/15/2014   K 4.6 06/15/2014   CL 102 06/15/2014   CO2 27 06/15/2014

## 2014-08-16 NOTE — Telephone Encounter (Signed)
emmi emailed °

## 2014-08-16 NOTE — Assessment & Plan Note (Signed)
Managed with prn Cardizem .  With no recent episodes.

## 2014-08-16 NOTE — Assessment & Plan Note (Signed)
Diet and activities recviewed in detail, reassurance provided, and prior labs reviewed.  Will repeat thyroid function given recent prolonged febrile illness.

## 2014-08-16 NOTE — Assessment & Plan Note (Signed)
Renal function has returned to baseline with avoidance of NSAIDs.  She is on an ARB for control of hypertension,, and statin for control of hyperlipidemia.   Lab Results  Component Value Date   CREATININE 1.08 06/15/2014

## 2014-08-16 NOTE — Assessment & Plan Note (Signed)
.  Thyroid function was  WNL on current dose.  Will recheck with fasting labs this week  Lab Results  Component Value Date   TSH 0.84 02/14/2014

## 2014-08-18 ENCOUNTER — Other Ambulatory Visit (INDEPENDENT_AMBULATORY_CARE_PROVIDER_SITE_OTHER): Payer: Medicare Other

## 2014-08-18 DIAGNOSIS — E785 Hyperlipidemia, unspecified: Secondary | ICD-10-CM | POA: Diagnosis not present

## 2014-08-18 DIAGNOSIS — E038 Other specified hypothyroidism: Secondary | ICD-10-CM

## 2014-08-18 DIAGNOSIS — E034 Atrophy of thyroid (acquired): Secondary | ICD-10-CM

## 2014-08-18 LAB — LIPID PANEL
Cholesterol: 218 mg/dL — ABNORMAL HIGH (ref 0–200)
HDL: 70.3 mg/dL (ref >39.00)
LDL CALC: 132 mg/dL — AB (ref 0–99)
NONHDL: 147.7
TRIGLYCERIDES: 81 mg/dL (ref 0.0–149.0)
Total CHOL/HDL Ratio: 3
VLDL: 16.2 mg/dL (ref 0.0–40.0)

## 2014-08-19 LAB — T4 AND TSH
T4, Total: 7 ug/dL (ref 4.5–12.0)
TSH: 1.87 u[IU]/mL (ref 0.450–4.500)

## 2014-08-20 ENCOUNTER — Encounter: Payer: Self-pay | Admitting: Internal Medicine

## 2014-08-22 ENCOUNTER — Other Ambulatory Visit: Payer: Self-pay | Admitting: Internal Medicine

## 2014-08-22 MED ORDER — PRAVASTATIN SODIUM 20 MG PO TABS: 20.0000 mg | ORAL_TABLET | Freq: Every day | ORAL | Status: DC

## 2014-08-22 NOTE — Telephone Encounter (Signed)
Electronic Rx request for pravastatin received. Medication is not listed on patient's current medication list. Medication was D/Ced on 05/11/14 listed as "change in therapy". Patient last office visit was 08/18/14 and labs were drawn. Please advise

## 2014-08-22 NOTE — Telephone Encounter (Signed)
Pravastatin 20mg  daily has been sent to patient's pharmacy as requested.

## 2014-08-22 NOTE — Telephone Encounter (Signed)
See e mail.  i have advised her to resume and she has agreed.  rx sent to [facility]

## 2014-09-25 NOTE — Op Note (Signed)
PATIENT NAME:  Ana Cruz, Ana Cruz MR#:  [id number] DATE OF BIRTH:  Nov 17, 1933  DATE OF PROCEDURE:  06/11/2011  PREOPERATIVE DIAGNOSIS: Visually significant cataract of the right eye.   POSTOPERATIVE DIAGNOSIS: Visually significant cataract of the right eye.   OPERATIVE PROCEDURE: Cataract extraction by phacoemulsification with implant of intraocular lens to right eye.   SURGEON: Birder Robson, MD.   ANESTHESIA:  1. Managed anesthesia care.  2. Topical tetracaine drops followed by 2% Xylocaine jelly applied in the preoperative holding area.   COMPLICATIONS: None.   TECHNIQUE:  Stop and chop.  DESCRIPTION OF PROCEDURE: The patient was examined and consented in the preoperative holding area where the aforementioned topical anesthesia was applied to the right eye and then brought back to the Operating Room where the right eye was prepped and draped in the usual sterile ophthalmic fashion and a lid speculum was placed. A paracentesis was created with the side port blade and the anterior chamber was filled with viscoelastic. A near clear corneal incision was performed with the steel keratome. A continuous curvilinear capsulorrhexis was performed with a cystotome followed by the capsulorrhexis forceps. Hydrodissection and hydrodelineation were carried out with BSS on a blunt cannula. The lens was removed in a stop and chop technique and the remaining cortical material was removed with the irrigation-aspiration handpiece. The capsular bag was inflated with viscoelastic and the Tecnis ZCB00 23.5 -diopter lens, serial number [id number] was placed in the capsular bag without complication. The remaining viscoelastic was removed from the eye with the irrigation-aspiration handpiece. The wounds were hydrated. The anterior chamber was flushed with Miostat and the eye was inflated to physiologic pressure. The wounds were found to be water tight. The eye was dressed with Vigamox and Omnipred. The patient was  given protective glasses to wear throughout the day and a shield with which to sleep tonight. The patient was also given drops with which to begin a drop regimen today and will follow-up with me in one day.  ____________________________ Livingston Diones. Jaunita Mikels, MD wlp:slb D: 06/11/2011 13:45:56 ET T: 06/11/2011 14:08:04 ET JOB#: [id number]  cc: Kylyn Sookram L. Allye Hoyos, MD, <Dictator> Livingston Diones Kody Vigil MD ELECTRONICALLY SIGNED 06/18/2011 12:18

## 2014-10-03 ENCOUNTER — Ambulatory Visit (INDEPENDENT_AMBULATORY_CARE_PROVIDER_SITE_OTHER): Payer: Medicare Other | Admitting: Podiatry

## 2014-10-03 DIAGNOSIS — B351 Tinea unguium: Secondary | ICD-10-CM

## 2014-10-03 DIAGNOSIS — M79606 Pain in leg, unspecified: Secondary | ICD-10-CM | POA: Diagnosis not present

## 2014-10-03 NOTE — Progress Notes (Signed)
S: Pt. Returns today noting thick/painful/elongated nails, tender when wearing footwear.  O: Exam reveals thick/dystrophic/mycotic nails 1-5 B, especially on hallux.      Vascular status intact, with good capillary refill. Mild sub 2 callus R.  A: Mycotic nails with pain B 1-5 as noted previously.  P: Debridement of elongated/dystrophic/mycotic nails 1-5, with no iatrogenic bleeding.      Return visit in about 3 months.

## 2014-11-09 DIAGNOSIS — I10 Essential (primary) hypertension: Secondary | ICD-10-CM | POA: Insufficient documentation

## 2015-01-02 ENCOUNTER — Ambulatory Visit (INDEPENDENT_AMBULATORY_CARE_PROVIDER_SITE_OTHER): Payer: Medicare Other | Admitting: Podiatry

## 2015-01-02 DIAGNOSIS — M79606 Pain in leg, unspecified: Secondary | ICD-10-CM | POA: Diagnosis not present

## 2015-01-02 DIAGNOSIS — B351 Tinea unguium: Secondary | ICD-10-CM | POA: Diagnosis not present

## 2015-01-02 NOTE — Progress Notes (Signed)
S: Pt. Returns today noting thick/painful/elongated nails, tender when wearing footwear.  O: Exam reveals thick/dystrophic/mycotic nails 1-5 B, especially on hallux.      Vascular status intact, with good capillary refill. Mild sub 2 callus R.  A: Mycotic nails with pain B 1-5 as noted previously.  P: Debridement of elongated/dystrophic/mycotic nails 1-5, with no iatrogenic bleeding.      Return visit in about 3 months.

## 2015-01-12 ENCOUNTER — Other Ambulatory Visit: Payer: Self-pay | Admitting: *Deleted

## 2015-01-12 NOTE — Telephone Encounter (Signed)
Okay to refill? Last seen on 08/15/14 & next appt on 02/21/15. Looks like this is a historical medication. Please advise

## 2015-01-13 ENCOUNTER — Encounter: Payer: Self-pay | Admitting: *Deleted

## 2015-01-13 NOTE — Telephone Encounter (Signed)
Sent mychart message to notify patient 

## 2015-01-13 NOTE — Telephone Encounter (Signed)
Refill denied.  MEDICATION WAS STOPPED BECAUSE IT ADVERSELY AFFECTED HER KIDNEY FUNCTION

## 2015-01-16 ENCOUNTER — Encounter: Payer: Self-pay | Admitting: Internal Medicine

## 2015-01-16 MED ORDER — TRAMADOL HCL 50 MG PO TABS: 50.0000 mg | ORAL_TABLET | Freq: Three times a day (TID) | ORAL | Status: DC | PRN

## 2015-01-18 NOTE — Telephone Encounter (Signed)
RX cancelled

## 2015-01-23 ENCOUNTER — Encounter: Payer: Self-pay | Admitting: Internal Medicine

## 2015-01-25 ENCOUNTER — Other Ambulatory Visit: Payer: Self-pay | Admitting: Internal Medicine

## 2015-01-25 ENCOUNTER — Other Ambulatory Visit: Payer: Self-pay | Admitting: *Deleted

## 2015-01-25 MED ORDER — TRAMADOL HCL 50 MG PO TABS: 50.0000 mg | ORAL_TABLET | Freq: Three times a day (TID) | ORAL | Status: DC | PRN

## 2015-01-25 NOTE — Telephone Encounter (Signed)
PATIENT REQUESTING  TRAMADOL REFILL THROUGH [facility],  RX PRINTED

## 2015-02-02 ENCOUNTER — Other Ambulatory Visit: Payer: Self-pay

## 2015-02-02 MED ORDER — TRAMADOL HCL 50 MG PO TABS
50.0000 mg | ORAL_TABLET | Freq: Three times a day (TID) | ORAL | Status: DC | PRN
Start: 2015-02-02 — End: 2015-02-21

## 2015-02-16 ENCOUNTER — Ambulatory Visit: Payer: Medicare Other | Admitting: Internal Medicine

## 2015-02-21 ENCOUNTER — Ambulatory Visit (INDEPENDENT_AMBULATORY_CARE_PROVIDER_SITE_OTHER): Payer: Medicare Other | Admitting: Internal Medicine

## 2015-02-21 ENCOUNTER — Encounter: Payer: Self-pay | Admitting: Internal Medicine

## 2015-02-21 VITALS — BP 120/68 | HR 60 | Temp 98.0°F | Resp 12 | Ht 63.0 in | Wt 176.0 lb

## 2015-02-21 DIAGNOSIS — Z113 Encounter for screening for infections with a predominantly sexual mode of transmission: Secondary | ICD-10-CM

## 2015-02-21 DIAGNOSIS — M545 Low back pain, unspecified: Secondary | ICD-10-CM

## 2015-02-21 DIAGNOSIS — Z23 Encounter for immunization: Secondary | ICD-10-CM

## 2015-02-21 DIAGNOSIS — I10 Essential (primary) hypertension: Secondary | ICD-10-CM

## 2015-02-21 DIAGNOSIS — E038 Other specified hypothyroidism: Secondary | ICD-10-CM

## 2015-02-21 DIAGNOSIS — N181 Chronic kidney disease, stage 1: Secondary | ICD-10-CM

## 2015-02-21 DIAGNOSIS — M4806 Spinal stenosis, lumbar region: Secondary | ICD-10-CM

## 2015-02-21 DIAGNOSIS — M48061 Spinal stenosis, lumbar region without neurogenic claudication: Secondary | ICD-10-CM

## 2015-02-21 DIAGNOSIS — E559 Vitamin D deficiency, unspecified: Secondary | ICD-10-CM

## 2015-02-21 DIAGNOSIS — E034 Atrophy of thyroid (acquired): Secondary | ICD-10-CM

## 2015-02-21 DIAGNOSIS — E669 Obesity, unspecified: Secondary | ICD-10-CM

## 2015-02-21 NOTE — Patient Instructions (Addendum)
Influenza Virus Vaccine injection (Fluarix) What is this medicine? INFLUENZA VIRUS VACCINE (in floo EN zuh VAHY ruhs vak SEEN) helps to reduce the risk of getting influenza also known as the flu. This medicine may be used for other purposes; ask your health care provider or pharmacist if you have questions. COMMON BRAND NAME(S): Fluarix, Fluzone What should I tell my health care provider before I take this medicine? They need to know if you have any of these conditions: -bleeding disorder like hemophilia -fever or infection -Guillain-Barre syndrome or other neurological problems -immune system problems -infection with the human immunodeficiency virus (HIV) or AIDS -low blood platelet counts -multiple sclerosis -an unusual or allergic reaction to influenza virus vaccine, eggs, chicken proteins, latex, gentamicin, other medicines, foods, dyes or preservatives -pregnant or trying to get pregnant -breast-feeding How should I use this medicine? This vaccine is for injection into a muscle. It is given by a health care professional. A copy of Vaccine Information Statements will be given before each vaccination. Read this sheet carefully each time. The sheet may change frequently. Talk to your pediatrician regarding the use of this medicine in children. Special care may be needed. Overdosage: If you think you have taken too much of this medicine contact a poison control center or emergency room at once. NOTE: This medicine is only for you. Do not share this medicine with others. What if I miss a dose? This does not apply. What may interact with this medicine? -chemotherapy or radiation therapy -medicines that lower your immune system like etanercept, anakinra, infliximab, and adalimumab -medicines that treat or prevent blood clots like warfarin -phenytoin -steroid medicines like prednisone or cortisone -theophylline -vaccines This list may not describe all possible interactions. Give your  health care provider a list of all the medicines, herbs, non-prescription drugs, or dietary supplements you use. Also tell them if you smoke, drink alcohol, or use illegal drugs. Some items may interact with your medicine. What should I watch for while using this medicine? Report any side effects that do not go away within 3 days to your doctor or health care professional. Call your health care provider if any unusual symptoms occur within 6 weeks of receiving this vaccine. You may still catch the flu, but the illness is not usually as bad. You cannot get the flu from the vaccine. The vaccine will not protect against colds or other illnesses that may cause fever. The vaccine is needed every year. What side effects may I notice from receiving this medicine? Side effects that you should report to your doctor or health care professional as soon as possible: -allergic reactions like skin rash, itching or hives, swelling of the face, lips, or tongue Side effects that usually do not require medical attention (report to your doctor or health care professional if they continue or are bothersome): -fever -headache -muscle aches and pains -pain, tenderness, redness, or swelling at site where injected -weak or tired This list may not describe all possible side effects. Call your doctor for medical advice about side effects. You may report side effects to FDA at 1-800-FDA-1088. Where should I keep my medicine? This vaccine is only given in a clinic, pharmacy, doctor's office, or other health care setting and will not be stored at home. NOTE: This sheet is a summary. It may not cover all possible information. If you have questions about this medicine, talk to your doctor, pharmacist, or health care provider.  2015, Elsevier/Gold Standard. (2007-12-16 09:30:40)   Referral to Dr  [facility] at [facility] for your back pain is in process  I want you to increase your activity so you can get your weight below  168 lbs (BMI < 30)  Use the pool and the Step machine   Return for fasting labs

## 2015-02-21 NOTE — Progress Notes (Signed)
using a cane whichis aggr  Patient ID: Ana Cruz, female    DOB: 12/05/1933  Age: 81 y.o. MRN: [id number]  CC: The primary encounter diagnosis was Encounter for immunization. Diagnoses of Spinal stenosis of lumbar region, Chronic kidney disease, stage I, Right-sided low back pain without sciatica, Obesity, Essential hypertension, Screen for STD (sexually transmitted disease), Hypothyroidism due to acquired atrophy of thyroid, and Vitamin D deficiency were also pertinent to this visit.  HPI Ana Cruz presents for follow up on chronic conditions including hypertension, hyperlipidemia, obesity and low back pain.    Low back pain radiating to left foot started on or around June 24t,  aggravated by 1000 mile car trip.  Using a cane which is aggravating the contralateral shoulder which had the rotator cuff surgery previously.  History of spinal stenosis by prior MRI.  Has not had PT.  Not exercising.  Has tried NSAIDs and tramadol nut NSAIDs have been discontinued due to worsening of CKD. [name] Pain is currently  >5/10,  Does not want to use narcotics.  Outpatient Prescriptions Prior to Visit  Medication Sig Dispense Refill  . alendronate (FOSAMAX) 70 MG tablet TAKE 1 TABLET  EVERY 7  DAYS  WITH A FULL GLASS OF WATER ON AN EMPTY STOMACH 12 tablet 3  . aspirin 81 MG tablet Take 81 mg by mouth daily.      . Cholecalciferol (VITAMIN D3) 1000 UNITS CAPS Take by mouth.      . diltiazem (CARDIZEM) 30 MG tablet Take 1 tablet (30 mg total) by mouth daily as needed. 30 tablet 4  . levothyroxine (SYNTHROID, LEVOTHROID) 88 MCG tablet TAKE 1 TABLET EVERY DAY 90 tablet 2  . losartan (COZAAR) 100 MG tablet TAKE 1 TABLET EVERY DAY 90 tablet 1  . pravastatin (PRAVACHOL) 20 MG tablet Take 1 tablet (20 mg total) by mouth daily. 90 tablet 1  . traMADol (ULTRAM) 50 MG tablet Take 1 tablet (50 mg total) by mouth every 8 (eight) hours as needed. 90 tablet 0  . meloxicam (MOBIC) 15 MG tablet Take 7.5 mg  by mouth daily.     . traMADol (ULTRAM) 50 MG tablet Take 1 tablet (50 mg total) by mouth every 8 (eight) hours as needed. maximum 3 daily 270 tablet 1   No facility-administered medications prior to visit.    Review of Systems;  Patient denies headache, fevers, malaise, unintentional weight loss, skin rash, eye pain, sinus congestion and sinus pain, sore throat, dysphagia,  hemoptysis , cough, dyspnea, wheezing, chest pain, palpitations, orthopnea, edema, abdominal pain, nausea, melena, diarrhea, constipation, flank pain, dysuria, hematuria, urinary  Frequency, nocturia, numbness, tingling, seizures,  Focal weakness, Loss of consciousness,  Tremor, insomnia, depression, anxiety, and suicidal ideation.      Objective:  BP 120/68 mmHg  Pulse 60  Temp(Src) 98 F (36.7 C) (Oral)  Resp 12  Ht 5\' 3"  (1.6 m)  Wt 176 lb (79.833 kg)  BMI 31.18 kg/m2  SpO2 98%  BP Readings from Last 3 Encounters:  02/21/15 120/68  08/15/14 126/62  07/13/14 134/69    Wt Readings from Last 3 Encounters:  02/21/15 176 lb (79.833 kg)  08/15/14 177 lb (80.287 kg)  07/13/14 175 lb (79.379 kg)    General appearance: alert, cooperative and appears stated age Neck: no adenopathy, no carotid bruit, supple, symmetrical, trachea midline and thyroid not enlarged, symmetric, no tenderness/mass/nodules Back: symmetric, no curvature. ROM normal. No CVA tenderness. Negative straight leg lift Lungs: clear to  auscultation bilaterally Heart: regular rate and rhythm, S1, S2 normal, no murmur, click, rub or gallop Abdomen: soft, non-tender; bowel sounds normal; no masses,  no organomegaly Pulses: 2+ and symmetric Skin: Skin color, texture, turgor normal. No rashes or lesions Lymph nodes: Cervical, supraclavicular, and axillary nodes normal.  No results found for: HGBA1C  Lab Results  Component Value Date   CREATININE 1.37* 06/17/2014   CREATININE 1.08 06/15/2014   CREATININE 1.1 02/14/2014    Lab Results    Component Value Date   WBC 5.9 06/17/2014   HGB 11.5* 06/17/2014   HCT 35.5 06/17/2014   PLT 403 06/17/2014   GLUCOSE 131* 06/17/2014   CHOL 218* 08/18/2014   TRIG 81.0 08/18/2014   HDL 70.30 08/18/2014   LDLDIRECT 121.3 01/19/2013   LDLCALC 132* 08/18/2014   ALT 28 06/17/2014   AST 27 06/17/2014   NA 137 06/17/2014   K 4.1 06/17/2014   CL 105 06/17/2014   CREATININE 1.37* 06/17/2014   BUN 31* 06/17/2014   CO2 23 06/17/2014   TSH 1.870 08/18/2014    No results found.  Assessment & Plan:   Problem List Items Addressed This Visit      Unprioritized   Chronic kidney disease, stage I    Renal function as returned to baseline with avoidance of NSAIDs.  She is on an ARB for control of hypertension,, and statin for control of hyperlipidemia.   Lab Results  Component Value Date   CREATININE 1.37* 06/17/2014           Hypertension    Well controlled on current regimen. Renal function stable, no changes today.  Lab Results  Component Value Date   CREATININE 1.37* 06/17/2014   Lab Results  Component Value Date   NA 137 06/17/2014   K 4.1 06/17/2014   CL 105 06/17/2014   CO2 23 06/17/2014         Obesity    She has gained weight due to relative inactivity   I have addressed BMI and recommended wt loss of 10% of body weight over the next 6 months using a low glycemic index diet and regular exercise a minimum of 5 days per week. Initial Goal is to reduce weight to 168 lbs .            Right-sided low back pain without sciatica    Secondary to spinal stenosis by prior MRI.  Weight loss,  Water aerobics,  Referral to [facility].  Avoid NSAIDs,  Continue tramadol.       Spinal stenosis of lumbar region    By priro MRI in 2013.  Current pain is on the left side but not suggestive of sciatica.  Referral to Dr Sharlet Salina for consideration of  ESI.      Relevant Orders   Ambulatory referral to Sports Medicine   Hypothyroidism    Other Visit Diagnoses    Encounter  for immunization    -  Primary    Screen for STD (sexually transmitted disease)        Relevant Orders    HIV antibody    Hepatitis C antibody    Vitamin D deficiency        Relevant Orders    Vit D  25 hydroxy (rtn osteoporosis monitoring)       I have discontinued Ms. Martes's meloxicam. I am also having her maintain her aspirin, Vitamin D3, diltiazem, levothyroxine, alendronate, losartan, pravastatin, traMADol, Biotin, fexofenadine, Polyethyl Glycol-Propyl Glycol, and amoxicillin.  Meds  ordered this encounter  Medications  . Biotin 1000 MCG tablet    Sig: Take 1,000 mcg by mouth daily.  . fexofenadine (ALLEGRA) 180 MG tablet    Sig: Take 180 mg by mouth daily.  Vladimir Faster Glycol-Propyl Glycol (SYSTANE) 0.4-0.3 % GEL ophthalmic gel    Sig: Place 1 application into both eyes 2 (two) times daily.  [name] amoxicillin (AMOXIL) 500 MG capsule    Sig: Take 500 mg by mouth. Take four tablets prior to dental work.    Medications Discontinued During This Encounter  Medication Reason  . traMADol (ULTRAM) 50 MG tablet Duplicate  . meloxicam (MOBIC) 15 MG tablet     Follow-up: Return in about 6 months (around 08/21/2015).   Crecencio Mc, MD

## 2015-02-21 NOTE — Assessment & Plan Note (Signed)
By priro MRI in 2013.  Current pain is on the left side but not suggestive of sciatica.  Referral to Dr Sharlet Salina for consideration of  ESI.

## 2015-02-21 NOTE — Progress Notes (Signed)
Pre-visit discussion using our clinic review tool. No additional management support is needed unless otherwise documented below in the visit note.  

## 2015-02-22 ENCOUNTER — Telehealth: Payer: Self-pay | Admitting: Internal Medicine

## 2015-02-22 DIAGNOSIS — Z13820 Encounter for screening for osteoporosis: Secondary | ICD-10-CM

## 2015-02-22 DIAGNOSIS — E034 Atrophy of thyroid (acquired): Secondary | ICD-10-CM

## 2015-02-22 DIAGNOSIS — M8580 Other specified disorders of bone density and structure, unspecified site: Secondary | ICD-10-CM

## 2015-02-22 DIAGNOSIS — N181 Chronic kidney disease, stage 1: Secondary | ICD-10-CM

## 2015-02-22 DIAGNOSIS — I471 Supraventricular tachycardia: Secondary | ICD-10-CM

## 2015-02-22 DIAGNOSIS — E785 Hyperlipidemia, unspecified: Secondary | ICD-10-CM

## 2015-02-22 DIAGNOSIS — I10 Essential (primary) hypertension: Secondary | ICD-10-CM

## 2015-02-22 NOTE — Telephone Encounter (Signed)
Good morning! Pt states yesterday that she needed lab work. I was waiting to see if lab orders would be in this morning. Need lab orders please. Thank You!

## 2015-02-22 NOTE — Telephone Encounter (Signed)
Labs ordered, please schedule

## 2015-02-22 NOTE — Telephone Encounter (Signed)
Please enter lab orders.

## 2015-02-22 NOTE — Telephone Encounter (Signed)
Yes, Thank you

## 2015-02-22 NOTE — Telephone Encounter (Signed)
TSH CMET CBC LIPID right? That what I put in.

## 2015-02-23 NOTE — Assessment & Plan Note (Signed)
Renal function as returned to baseline with avoidance of NSAIDs.  She is on an ARB for control of hypertension,, and statin for control of hyperlipidemia.   Lab Results  Component Value Date   CREATININE 1.37* 06/17/2014

## 2015-02-23 NOTE — Assessment & Plan Note (Signed)
She has gained weight due to relative inactivity   I have addressed BMI and recommended wt loss of 10% of body weight over the next 6 months using a low glycemic index diet and regular exercise a minimum of 5 days per week. Initial Goal is to reduce weight to 168 lbs .

## 2015-02-23 NOTE — Assessment & Plan Note (Signed)
Well controlled on current regimen. Renal function stable, no changes today.  Lab Results  Component Value Date   CREATININE 1.37* 06/17/2014   Lab Results  Component Value Date   NA 137 06/17/2014   K 4.1 06/17/2014   CL 105 06/17/2014   CO2 23 06/17/2014

## 2015-02-23 NOTE — Assessment & Plan Note (Signed)
Secondary to spinal stenosis by prior MRI.  Weight loss,  Water aerobics,  Referral to [facility].  Avoid NSAIDs,  Continue tramadol.

## 2015-02-28 ENCOUNTER — Other Ambulatory Visit (INDEPENDENT_AMBULATORY_CARE_PROVIDER_SITE_OTHER): Payer: Medicare Other

## 2015-02-28 DIAGNOSIS — E785 Hyperlipidemia, unspecified: Secondary | ICD-10-CM | POA: Diagnosis not present

## 2015-02-28 DIAGNOSIS — E559 Vitamin D deficiency, unspecified: Secondary | ICD-10-CM

## 2015-02-28 DIAGNOSIS — E038 Other specified hypothyroidism: Secondary | ICD-10-CM | POA: Diagnosis not present

## 2015-02-28 DIAGNOSIS — N181 Chronic kidney disease, stage 1: Secondary | ICD-10-CM

## 2015-02-28 DIAGNOSIS — Z113 Encounter for screening for infections with a predominantly sexual mode of transmission: Secondary | ICD-10-CM

## 2015-02-28 DIAGNOSIS — E034 Atrophy of thyroid (acquired): Secondary | ICD-10-CM | POA: Diagnosis not present

## 2015-02-28 LAB — CBC WITH DIFFERENTIAL/PLATELET
BASOS ABS: 0 10*3/uL (ref 0.0–0.1)
Basophils Relative: 0.4 % (ref 0.0–3.0)
Eosinophils Absolute: 0.2 10*3/uL (ref 0.0–0.7)
Eosinophils Relative: 3.1 % (ref 0.0–5.0)
HEMATOCRIT: 34.5 % — AB (ref 36.0–46.0)
HEMOGLOBIN: 11.4 g/dL — AB (ref 12.0–15.0)
LYMPHS PCT: 27 % (ref 12.0–46.0)
Lymphs Abs: 2 10*3/uL (ref 0.7–4.0)
MCHC: 32.9 g/dL (ref 30.0–36.0)
MCV: 92.2 fl (ref 78.0–100.0)
MONOS PCT: 7.4 % (ref 3.0–12.0)
Monocytes Absolute: 0.6 10*3/uL (ref 0.1–1.0)
Neutro Abs: 4.7 10*3/uL (ref 1.4–7.7)
Neutrophils Relative %: 62.1 % (ref 43.0–77.0)
Platelets: 266 10*3/uL (ref 150.0–400.0)
RBC: 3.74 Mil/uL — AB (ref 3.87–5.11)
RDW: 14.4 % (ref 11.5–15.5)
WBC: 7.6 10*3/uL (ref 4.0–10.5)

## 2015-02-28 LAB — LIPID PANEL
CHOL/HDL RATIO: 3
Cholesterol: 195 mg/dL (ref 0–200)
HDL: 73.6 mg/dL (ref >39.00)
LDL CALC: 107 mg/dL — AB (ref 0–99)
NonHDL: 121.18
TRIGLYCERIDES: 70 mg/dL (ref 0.0–149.0)
VLDL: 14 mg/dL (ref 0.0–40.0)

## 2015-02-28 LAB — COMPREHENSIVE METABOLIC PANEL
ALK PHOS: 100 U/L (ref 39–117)
ALT: 10 U/L (ref 0–35)
AST: 15 U/L (ref 0–37)
Albumin: 3.9 g/dL (ref 3.5–5.2)
BILIRUBIN TOTAL: 0.3 mg/dL (ref 0.2–1.2)
BUN: 38 mg/dL — ABNORMAL HIGH (ref 6–23)
CALCIUM: 9.2 mg/dL (ref 8.4–10.5)
CO2: 27 mEq/L (ref 19–32)
Chloride: 106 mEq/L (ref 96–112)
Creatinine, Ser: 1.14 mg/dL (ref 0.40–1.20)
GFR: 48.79 mL/min — AB (ref >60.00)
Glucose, Bld: 94 mg/dL (ref 70–99)
Potassium: 4.6 mEq/L (ref 3.5–5.1)
Sodium: 140 mEq/L (ref 135–145)
TOTAL PROTEIN: 6.6 g/dL (ref 6.0–8.3)

## 2015-02-28 LAB — VITAMIN D 25 HYDROXY (VIT D DEFICIENCY, FRACTURES): VITD: 44.03 ng/mL (ref 30.00–100.00)

## 2015-02-28 LAB — TSH: TSH: 1.1 u[IU]/mL (ref 0.35–4.50)

## 2015-03-01 LAB — HEPATITIS C ANTIBODY: HCV Ab: NEGATIVE

## 2015-03-01 LAB — HIV ANTIBODY (ROUTINE TESTING W REFLEX): HIV: NONREACTIVE

## 2015-03-05 ENCOUNTER — Encounter: Payer: Self-pay | Admitting: Internal Medicine

## 2015-03-09 ENCOUNTER — Other Ambulatory Visit: Payer: Self-pay | Admitting: Internal Medicine

## 2015-03-13 ENCOUNTER — Other Ambulatory Visit: Payer: Self-pay | Admitting: Physical Medicine and Rehabilitation

## 2015-03-13 DIAGNOSIS — M5417 Radiculopathy, lumbosacral region: Secondary | ICD-10-CM

## 2015-03-20 ENCOUNTER — Ambulatory Visit
Admission: RE | Admit: 2015-03-20 | Discharge: 2015-03-20 | Disposition: A | Discharge status: Home / Self Care | Payer: Medicare Other | Source: Ambulatory Visit | Attending: Physical Medicine and Rehabilitation | Admitting: Physical Medicine and Rehabilitation

## 2015-03-20 DIAGNOSIS — M5417 Radiculopathy, lumbosacral region: Secondary | ICD-10-CM

## 2015-04-05 ENCOUNTER — Encounter: Payer: Self-pay | Admitting: Podiatry

## 2015-04-05 ENCOUNTER — Ambulatory Visit: Payer: Medicare Other

## 2015-04-05 ENCOUNTER — Ambulatory Visit (INDEPENDENT_AMBULATORY_CARE_PROVIDER_SITE_OTHER): Payer: Medicare Other | Admitting: Podiatry

## 2015-04-05 DIAGNOSIS — B351 Tinea unguium: Secondary | ICD-10-CM

## 2015-04-05 DIAGNOSIS — M79676 Pain in unspecified toe(s): Secondary | ICD-10-CM

## 2015-04-05 NOTE — Progress Notes (Signed)
She presents today to complaint of painful elongated toenails.  Objective: Vital signs are stable she is alert and oriented 3. Pulses are palpable. Her toenails are thick yellow dystrophic onychomycotic and painful palpation.  Assessment: Pain and limp secondary to onychomycosis 1 through 5 bilateral.  Plan: Discussed etiology pathology conservative versus surgical therapies. Debrided nails 1 through 5 bilateral cover service secondary to pain.  Roselind Messier DPM

## 2015-04-11 ENCOUNTER — Ambulatory Visit: Admission: RE | Admit: 2015-04-11 | Payer: Medicare Other | Source: Ambulatory Visit

## 2015-04-21 ENCOUNTER — Ambulatory Visit
Admission: RE | Admit: 2015-04-21 | Discharge: 2015-04-21 | Disposition: A | Discharge status: Home / Self Care | Payer: Medicare Other | Source: Ambulatory Visit | Attending: Physical Medicine and Rehabilitation | Admitting: Physical Medicine and Rehabilitation

## 2015-04-21 DIAGNOSIS — M5137 Other intervertebral disc degeneration, lumbosacral region: Secondary | ICD-10-CM | POA: Diagnosis not present

## 2015-04-21 DIAGNOSIS — M545 Low back pain: Secondary | ICD-10-CM | POA: Insufficient documentation

## 2015-04-21 DIAGNOSIS — M4826 Kissing spine, lumbar region: Secondary | ICD-10-CM | POA: Diagnosis not present

## 2015-04-21 DIAGNOSIS — M79606 Pain in leg, unspecified: Secondary | ICD-10-CM | POA: Insufficient documentation

## 2015-04-21 DIAGNOSIS — M47896 Other spondylosis, lumbar region: Secondary | ICD-10-CM | POA: Insufficient documentation

## 2015-05-01 ENCOUNTER — Other Ambulatory Visit: Payer: Self-pay | Admitting: Internal Medicine

## 2015-05-03 ENCOUNTER — Other Ambulatory Visit: Payer: Self-pay | Admitting: Internal Medicine

## 2015-06-14 ENCOUNTER — Ambulatory Visit: Payer: Medicare Other | Admitting: Podiatry

## 2015-06-22 ENCOUNTER — Other Ambulatory Visit: Payer: Self-pay | Admitting: Orthopedic Surgery

## 2015-06-22 DIAGNOSIS — M19011 Primary osteoarthritis, right shoulder: Secondary | ICD-10-CM

## 2015-07-03 ENCOUNTER — Encounter: Payer: Self-pay | Admitting: Podiatry

## 2015-07-03 ENCOUNTER — Ambulatory Visit (INDEPENDENT_AMBULATORY_CARE_PROVIDER_SITE_OTHER): Payer: Medicare Other | Admitting: Podiatry

## 2015-07-03 DIAGNOSIS — M79676 Pain in unspecified toe(s): Secondary | ICD-10-CM | POA: Diagnosis not present

## 2015-07-03 DIAGNOSIS — B351 Tinea unguium: Secondary | ICD-10-CM

## 2015-07-03 NOTE — Progress Notes (Signed)
He presents today with a chief complaint of painful elongated toenails 1 through 5 bilateral.  Objective: Toenails are thick yellow dystrophic mycotic painful palpation.  Assessment: Pain and limited secondary to onychomycosis 1 through 5 bilateral.  Plan: Discussed etiology pathology conservative versus surgical therapies. Debridement of nails 1 through 5 bilateral.

## 2015-07-06 ENCOUNTER — Ambulatory Visit
Admission: RE | Admit: 2015-07-06 | Discharge: 2015-07-06 | Disposition: A | Discharge status: Home / Self Care | Payer: Medicare Other | Source: Ambulatory Visit | Attending: Orthopedic Surgery | Admitting: Orthopedic Surgery

## 2015-07-06 DIAGNOSIS — M19011 Primary osteoarthritis, right shoulder: Secondary | ICD-10-CM | POA: Insufficient documentation

## 2015-07-06 MED ORDER — BUPIVACAINE HCL (PF) 0.25 % IJ SOLN
7.0000 mL | Freq: Once | INTRAMUSCULAR | Status: AC
  Administered 2015-07-06: 7 mL via INTRA_ARTICULAR
  Filled 2015-07-06: qty 10

## 2015-07-06 MED ORDER — IOHEXOL 300 MG/ML  SOLN
10.0000 mL | Freq: Once | INTRAMUSCULAR | Status: AC | PRN
  Administered 2015-07-06: 10 mL via INTRA_ARTICULAR

## 2015-07-06 MED ORDER — METHYLPREDNISOLONE ACETATE 80 MG/ML IJ SUSP
80.0000 mg | Freq: Once | INTRAMUSCULAR | Status: AC
  Administered 2015-07-06: 80 mg via INTRA_ARTICULAR
  Filled 2015-07-06: qty 1

## 2015-08-02 ENCOUNTER — Other Ambulatory Visit: Payer: Self-pay | Admitting: Orthopedic Surgery

## 2015-08-02 DIAGNOSIS — M25511 Pain in right shoulder: Secondary | ICD-10-CM

## 2015-08-15 ENCOUNTER — Ambulatory Visit
Admission: RE | Admit: 2015-08-15 | Discharge: 2015-08-15 | Disposition: A | Discharge status: Home / Self Care | Payer: Medicare Other | Source: Ambulatory Visit | Attending: Orthopedic Surgery | Admitting: Orthopedic Surgery

## 2015-08-15 DIAGNOSIS — M25511 Pain in right shoulder: Secondary | ICD-10-CM

## 2015-08-21 ENCOUNTER — Ambulatory Visit: Payer: Medicare Other | Admitting: Internal Medicine

## 2015-08-21 ENCOUNTER — Ambulatory Visit (INDEPENDENT_AMBULATORY_CARE_PROVIDER_SITE_OTHER): Payer: Medicare Other | Admitting: Internal Medicine

## 2015-08-21 ENCOUNTER — Encounter: Payer: Self-pay | Admitting: Internal Medicine

## 2015-08-21 ENCOUNTER — Ambulatory Visit: Payer: Medicare Other

## 2015-08-21 ENCOUNTER — Telehealth: Payer: Self-pay | Admitting: Internal Medicine

## 2015-08-21 VITALS — BP 138/72 | HR 59 | Temp 98.1°F | Resp 12 | Ht 63.0 in | Wt 176.5 lb

## 2015-08-21 DIAGNOSIS — Z7289 Other problems related to lifestyle: Secondary | ICD-10-CM

## 2015-08-21 DIAGNOSIS — E559 Vitamin D deficiency, unspecified: Secondary | ICD-10-CM

## 2015-08-21 DIAGNOSIS — E785 Hyperlipidemia, unspecified: Secondary | ICD-10-CM

## 2015-08-21 DIAGNOSIS — M8448XD Pathological fracture, other site, subsequent encounter for fracture with routine healing: Secondary | ICD-10-CM

## 2015-08-21 DIAGNOSIS — Z0000 Encounter for general adult medical examination without abnormal findings: Secondary | ICD-10-CM | POA: Diagnosis not present

## 2015-08-21 DIAGNOSIS — I10 Essential (primary) hypertension: Secondary | ICD-10-CM

## 2015-08-21 DIAGNOSIS — M25511 Pain in right shoulder: Secondary | ICD-10-CM

## 2015-08-21 DIAGNOSIS — G8929 Other chronic pain: Secondary | ICD-10-CM | POA: Insufficient documentation

## 2015-08-21 DIAGNOSIS — R5383 Other fatigue: Secondary | ICD-10-CM

## 2015-08-21 DIAGNOSIS — M7531 Calcific tendinitis of right shoulder: Secondary | ICD-10-CM

## 2015-08-21 DIAGNOSIS — Z91018 Allergy to other foods: Secondary | ICD-10-CM

## 2015-08-21 DIAGNOSIS — Z1239 Encounter for other screening for malignant neoplasm of breast: Secondary | ICD-10-CM

## 2015-08-21 DIAGNOSIS — M810 Age-related osteoporosis without current pathological fracture: Secondary | ICD-10-CM

## 2015-08-21 DIAGNOSIS — D509 Iron deficiency anemia, unspecified: Secondary | ICD-10-CM | POA: Diagnosis not present

## 2015-08-21 DIAGNOSIS — M8580 Other specified disorders of bone density and structure, unspecified site: Secondary | ICD-10-CM

## 2015-08-21 DIAGNOSIS — N181 Chronic kidney disease, stage 1: Secondary | ICD-10-CM

## 2015-08-21 HISTORY — DX: Pathological fracture, other site, subsequent encounter for fracture with routine healing: M84.48XD

## 2015-08-21 LAB — LIPID PANEL
CHOL/HDL RATIO: 3
Cholesterol: 198 mg/dL (ref 0–200)
HDL: 71.9 mg/dL (ref >39.00)
LDL Cholesterol: 110 mg/dL — ABNORMAL HIGH (ref 0–99)
NonHDL: 125.9
TRIGLYCERIDES: 81 mg/dL (ref 0.0–149.0)
VLDL: 16.2 mg/dL (ref 0.0–40.0)

## 2015-08-21 LAB — IRON AND TIBC
%SAT: 32 % (ref 11–50)
Iron: 90 ug/dL (ref 45–160)
TIBC: 277 ug/dL (ref 250–450)
UIBC: 187 ug/dL (ref 125–400)

## 2015-08-21 LAB — CBC WITH DIFFERENTIAL/PLATELET
BASOS ABS: 0 10*3/uL (ref 0.0–0.1)
BASOS PCT: 0.3 % (ref 0.0–3.0)
Eosinophils Absolute: 0.1 10*3/uL (ref 0.0–0.7)
Eosinophils Relative: 1.9 % (ref 0.0–5.0)
HEMATOCRIT: 35.4 % — AB (ref 36.0–46.0)
Hemoglobin: 11.6 g/dL — ABNORMAL LOW (ref 12.0–15.0)
LYMPHS ABS: 1.7 10*3/uL (ref 0.7–4.0)
LYMPHS PCT: 21.5 % (ref 12.0–46.0)
MCHC: 32.8 g/dL (ref 30.0–36.0)
MCV: 93.4 fl (ref 78.0–100.0)
Monocytes Absolute: 0.6 10*3/uL (ref 0.1–1.0)
Monocytes Relative: 7.8 % (ref 3.0–12.0)
NEUTROS ABS: 5.4 10*3/uL (ref 1.4–7.7)
NEUTROS PCT: 68.5 % (ref 43.0–77.0)
PLATELETS: 290 10*3/uL (ref 150.0–400.0)
RBC: 3.79 Mil/uL — ABNORMAL LOW (ref 3.87–5.11)
RDW: 15.3 % (ref 11.5–15.5)
WBC: 7.8 10*3/uL (ref 4.0–10.5)

## 2015-08-21 LAB — COMPREHENSIVE METABOLIC PANEL
ALBUMIN: 3.9 g/dL (ref 3.5–5.2)
ALK PHOS: 82 U/L (ref 39–117)
ALT: 12 U/L (ref 0–35)
AST: 18 U/L (ref 0–37)
BILIRUBIN TOTAL: 0.4 mg/dL (ref 0.2–1.2)
BUN: 32 mg/dL — AB (ref 6–23)
CO2: 27 mEq/L (ref 19–32)
CREATININE: 1.1 mg/dL (ref 0.40–1.20)
Calcium: 9.5 mg/dL (ref 8.4–10.5)
Chloride: 105 mEq/L (ref 96–112)
GFR: 50.78 mL/min — ABNORMAL LOW (ref >60.00)
GLUCOSE: 86 mg/dL (ref 70–99)
Potassium: 4.5 mEq/L (ref 3.5–5.1)
SODIUM: 139 meq/L (ref 135–145)
TOTAL PROTEIN: 7 g/dL (ref 6.0–8.3)

## 2015-08-21 LAB — FERRITIN: Ferritin: 117.3 ng/mL (ref 10.0–291.0)

## 2015-08-21 LAB — VITAMIN D 25 HYDROXY (VIT D DEFICIENCY, FRACTURES): VITD: 41.78 ng/mL (ref 30.00–100.00)

## 2015-08-21 LAB — TSH: TSH: 1.86 u[IU]/mL (ref 0.35–4.50)

## 2015-08-21 LAB — HIV ANTIBODY (ROUTINE TESTING W REFLEX): HIV 1&2 Ab, 4th Generation: NONREACTIVE

## 2015-08-21 MED ORDER — MONTELUKAST SODIUM 10 MG PO TABS: 10.0000 mg | ORAL_TABLET | Freq: Every day | ORAL | Status: DC

## 2015-08-21 NOTE — Telephone Encounter (Signed)
Pt called to give the correct pharmacy she goes to its [facility] on [address]. Please remove [employer] [employer]. Call pt @ [phone]. Thank you!

## 2015-08-21 NOTE — Telephone Encounter (Signed)
Pharmacy changed

## 2015-08-21 NOTE — Patient Instructions (Signed)

## 2015-08-21 NOTE — Progress Notes (Signed)
Pre-visit discussion using our clinic review tool. No additional management support is needed unless otherwise documented below in the visit note.  

## 2015-08-21 NOTE — Progress Notes (Signed)
Patient ID: Ana Cruz, female    DOB: 19-Dec-1933  Age: 82 y.o. MRN: [id number]  The patient is here for annual Medicare wellness examination and management of other chronic and acute problems.   The risk factors are reflected in the social history.  The roster of all physicians providing medical care to patient - is listed in the Snapshot section of the chart.  Activities of daily living:  The patient is 100% independent in all ADLs: dressing, toileting, feeding as well as independent mobility  Home safety : The patient has smoke detectors in the home. They wear seatbelts.  There are no firearms at home. There is no violence in the home.   There is no risks for hepatitis, STDs or HIV. There is no   history of blood transfusion. They have no travel history to infectious disease endemic areas of the world.  The patient has seen their dentist in the last six month. They have seen their eye doctor in the last year. They admit to slight hearing difficulty with regard to whispered voices and some television programs.  They have deferred audiologic testing in the last year.  They do not  have excessive sun exposure. Discussed the need for sun protection: hats, long sleeves and use of sunscreen if there is significant sun exposure.   Diet: the importance of a healthy diet is discussed. They do have a healthy diet.  The benefits of regular aerobic exercise were discussed. She walks 4 times per week ,  20 minutes.   Depression screen: there are no signs or vegative symptoms of depression- irritability, change in appetite, anhedonia, sadness/tearfullness.  Cognitive assessment: the patient manages all their financial and personal affairs and is actively engaged. They could relate day,date,year and events; recalled 2/3 objects at 3 minutes; performed clock-face test normally.  The following portions of the patient's history were reviewed and updated as appropriate: allergies, current  medications, past family history, past medical history,  past surgical history, past social history  and problem list.  Visual acuity was not assessed per patient preference since she has regular follow up with her ophthalmologist. Hearing and body mass index were assessed and reviewed.   During the course of the visit the patient was educated and counseled about appropriate screening and preventive services including : fall prevention , diabetes screening, nutrition counseling, colorectal cancer screening, and recommended immunizations.    CC: The primary encounter diagnosis was Medicare annual wellness visit, subsequent. Diagnoses of Iron deficiency anemia, Hyperlipidemia, Other fatigue, Vitamin D deficiency, Other problems related to lifestyle, Breast cancer screening, Chronic right shoulder pain, Bilateral sacral insufficiency fracture with routine healing, Essential hypertension, Chronic kidney disease, stage I, Calcific tendonitis of right shoulder, Osteopenia, History of food anaphylaxis, and Osteoporosis were also pertinent to this visit.   Right shoulder pain.  Has had 2 I/A injectiions  17 PT sessions,  MRI done by [facility], and referred to [facility] in [place]. She can raise arm to almost shoulder level with flexion and abduction before having sharp pain.  Low back pain:  MRI done   Bilateral insufficiency fractures,  Already taking alendronate for ostepenia by 2015 DEXA.  Using tramadol and tylenol prn .   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  Discussed adding famotidine and Singulair.  History Kisha has a past medical history of Spinal stenosis; Elbow fracture, left; Atrial fibrillation ([place]); hypothyroidism; Chronic kidney disease, stage  I; Hypertension; allergic rhinitis; and Hyperlipidemia.   She has past surgical history that includes Rotator cuff repair (Jan 2000); Ovarian cyst removal (April 1969);  Bladder repair (1980); Rotator cuff repair (2000); Replacement total knee bilateral (2009); Abdominal hysterectomy (1979); Appendectomy (1969); and Bilateral oophorectomy.   Her family history includes Cancer (age of onset: 58) in her paternal grandfather; Heart disease (age of onset: 23) in her father; Hypertension in her mother.She reports that she has never smoked. She has never used smokeless tobacco. She reports that she drinks alcohol. She reports that she does not use illicit drugs.  Outpatient Prescriptions Prior to Visit  Medication Sig Dispense Refill  . alendronate (FOSAMAX) 70 MG tablet TAKE 1 TABLET  EVERY 7  DAYS  WITH A FULL GLASS OF WATER ON AN EMPTY STOMACH 12 tablet 3  . amoxicillin (AMOXIL) 500 MG capsule Take 500 mg by mouth. Take four tablets prior to dental work.    [name] aspirin 81 MG tablet Take 81 mg by mouth daily.      . Biotin 1000 MCG tablet Take 1,000 mcg by mouth daily.    . Cholecalciferol (VITAMIN D3) 1000 UNITS CAPS Take by mouth.      . diltiazem (CARDIZEM) 30 MG tablet Take 1 tablet (30 mg total) by mouth daily as needed. 30 tablet 4  . fexofenadine (ALLEGRA) 180 MG tablet Take 180 mg by mouth daily.    [name] levothyroxine (SYNTHROID, LEVOTHROID) 88 MCG tablet TAKE 1 TABLET EVERY DAY 90 tablet 2  . losartan (COZAAR) 100 MG tablet TAKE 1 TABLET EVERY DAY 90 tablet 1  . Polyethyl Glycol-Propyl Glycol (SYSTANE) 0.4-0.3 % GEL ophthalmic gel Place 1 application into both eyes 2 (two) times daily.    . pravastatin (PRAVACHOL) 20 MG tablet TAKE 1 TABLET EVERY DAY 90 tablet 1  . traMADol (ULTRAM) 50 MG tablet Take 1 tablet (50 mg total) by mouth every 8 (eight) hours as needed. 90 tablet 0   No facility-administered medications prior to visit.    Review of Systems  Patient denies headache, fevers, malaise, unintentional weight loss, skin rash, eye pain, sinus congestion and sinus pain, sore throat, dysphagia,  hemoptysis , cough, dyspnea, wheezing, chest pain,  palpitations, orthopnea, edema, abdominal pain, nausea, melena, diarrhea, constipation, flank pain, dysuria, hematuria, urinary  Frequency, nocturia, numbness, tingling, seizures,  Focal weakness, Loss of consciousness,  Tremor, insomnia, depression, anxiety, and suicidal ideation.     Objective:  BP 138/72 mmHg  Pulse 59  Temp(Src) 98.1 F (36.7 C) (Oral)  Resp 12  Ht 5\' 3"  (1.6 m)  Wt 176 lb 8 oz (80.06 kg)  BMI 31.27 kg/m2  SpO2 97%  Physical Exam   General appearance: alert, cooperative and appears stated age Head: Normocephalic, without obvious abnormality, atraumatic Eyes: conjunctivae/corneas clear. PERRL, EOM's intact. Fundi benign. Ears: normal TM's and external ear canals both ears Nose: Nares normal. Septum midline. Mucosa normal. No drainage or sinus tenderness. Throat: lips, mucosa, and tongue normal; teeth and gums normal Neck: no adenopathy, no carotid bruit, no JVD, supple, symmetrical, trachea midline and thyroid not enlarged, symmetric, no tenderness/mass/nodules Lungs: clear to auscultation bilaterally Breasts: normal appearance, no masses or tenderness Heart: regular rate and rhythm, S1, S2 normal, no murmur, click, rub or gallop Abdomen: soft, non-tender; bowel sounds normal; no masses,  no organomegaly Extremities: extremities normal, atraumatic, no cyanosis or edema Pulses: 2+ and symmetric Skin: Skin color, texture, turgor normal. No rashes or lesions Neurologic: Alert and oriented X 3, normal  strength and tone. Normal symmetric reflexes. Normal coordination and gait MSK: right shoulder ROM restricted by pain to < 180 degrees of flexion and abduction .     Assessment & Plan:   Problem List Items Addressed This Visit    Chronic kidney disease, stage I    Renal function has remained stable and at baseline with avoidance of NSAIDs.  She is on an ARB for control of hypertension,, and tolerating a statin for control of hyperlipidemia.   Lab Results   Component Value Date   CREATININE 1.10 08/21/2015             Hypertension    Well controlled on current regimen. Renal function stable, no changes today.  Lab Results  Component Value Date   CREATININE 1.10 08/21/2015   Lab Results  Component Value Date   NA 139 08/21/2015   K 4.5 08/21/2015   CL 105 08/21/2015   CO2 27 08/21/2015         Osteopenia    Given the " insufficiency" fractures noted on lumbar MRI  Repeat DEXA is planned and medication change to Prolia was dicussed.       Calcific tendonitis of right shoulder            Medicare annual wellness visit, subsequent - Primary    Annual wellness  exam was done as well as a comprehensive physical exam  .  During the course of the visit the patient was educated and counseled about appropriate screening and preventive services regarding screening for colon and breast cancer .   annual lipid screening.   nutrition counseling, skin cancer screening has been done, along with review of the age appropriate recommended immunizations.  Printed recommendations for health maintenance screenings was given.       RESOLVED: Fatigue   Relevant Orders   Comprehensive metabolic panel (Completed)   TSH (Completed)   Chronic right shoulder pain    Treated initially by [facility] as calcific tendonitis with steroid injections and PT,  With no improvement  Recent MRI reviewed with patient;  Images suggesting additional issues including rotator cuff pathology despite prior remote repair.  She is not interested in surgery unless her pain becomes unbearable or significantly limiting her ROM.       Bilateral sacral insufficiency fracture with routine healing    Suggested by MRI of lumbar spine ordered by Ortho,  Reviewed with patient today ,  And recommended DEXA scan to evaluate bone density      Relevant Orders   DG Bone Density   History of food anaphylaxis    With allergies to chocolate and onions.  She has avoided  purchasing an Epi pen due to cost ($500) ,  i have recommended adding famotidine bid and Singulair.       Hyperlipidemia   Relevant Orders   Lipid panel (Completed)    Other Visit Diagnoses    Iron deficiency anemia        Relevant Orders    CBC with Differential/Platelet (Completed)    Iron and TIBC (Completed)    Ferritin (Completed)    Vitamin D deficiency        Relevant Orders    VITAMIN D 25 Hydroxy (Vit-D Deficiency, Fractures) (Completed)    Other problems related to lifestyle        Relevant Orders    Hepatitis C antibody (Completed)    HIV antibody (Completed)    Breast cancer screening  Relevant Orders    MM DIGITAL SCREENING BILATERAL    Osteoporosis        Relevant Orders    DG Bone Density       I am having Ms. Herberger start on montelukast and famotidine. I am also having her maintain her aspirin, Vitamin D3, diltiazem, traMADol, Biotin, fexofenadine, Polyethyl Glycol-Propyl Glycol, amoxicillin, losartan, levothyroxine, alendronate, and pravastatin.  Meds ordered this encounter  Medications  . montelukast (SINGULAIR) 10 MG tablet    Sig: Take 1 tablet (10 mg total) by mouth at bedtime.    Dispense:  30 tablet    Refill:  3  . famotidine (PEPCID) 20 MG tablet    Sig: Take 1 tablet (20 mg total) by mouth 2 (two) times daily.    Dispense:  1800 tablet    Refill:  2    There are no discontinued medications.  Follow-up: No Follow-up on file.   Crecencio Mc, MD

## 2015-08-22 ENCOUNTER — Other Ambulatory Visit: Payer: Self-pay

## 2015-08-22 ENCOUNTER — Encounter: Payer: Self-pay | Admitting: Internal Medicine

## 2015-08-22 DIAGNOSIS — Z91018 Allergy to other foods: Secondary | ICD-10-CM | POA: Insufficient documentation

## 2015-08-22 LAB — HEPATITIS C ANTIBODY: HCV Ab: NEGATIVE

## 2015-08-22 MED ORDER — FAMOTIDINE 20 MG PO TABS: 20.0000 mg | ORAL_TABLET | Freq: Two times a day (BID) | ORAL | Status: DC

## 2015-08-22 NOTE — Assessment & Plan Note (Addendum)
Treated initially by [facility] as calcific tendonitis with steroid injections and PT,  With no improvement  Recent MRI reviewed with patient;  Images suggesting additional issues including rotator cuff pathology despite prior remote repair.  She is not interested in surgery unless her pain becomes unbearable or significantly limiting her ROM.

## 2015-08-22 NOTE — Assessment & Plan Note (Signed)
Suggested by MRI of lumbar spine ordered by Ortho,  Reviewed with patient today ,  And recommended DEXA scan to evaluate bone density

## 2015-08-22 NOTE — Assessment & Plan Note (Signed)
Annual wellness  exam was done as well as a comprehensive physical exam  .  During the course of the visit the patient was educated and counseled about appropriate screening and preventive services regarding screening for colon and breast cancer .   annual lipid screening.   nutrition counseling, skin cancer screening has been done, along with review of the age appropriate recommended immunizations.  Printed recommendations for health maintenance screenings was given.

## 2015-08-22 NOTE — Assessment & Plan Note (Signed)
Well controlled on current regimen. Renal function stable, no changes today.  Lab Results  Component Value Date   CREATININE 1.10 08/21/2015   Lab Results  Component Value Date   NA 139 08/21/2015   K 4.5 08/21/2015   CL 105 08/21/2015   CO2 27 08/21/2015

## 2015-08-22 NOTE — Assessment & Plan Note (Signed)
With allergies to chocolate and onions.  She has avoided purchasing an Epi pen due to cost ($500) ,  i have recommended adding famotidine bid and Singulair.

## 2015-08-22 NOTE — Assessment & Plan Note (Signed)
Given the " insufficiency" fractures noted on lumbar MRI  Repeat DEXA is planned and medication change to Prolia was dicussed.

## 2015-08-22 NOTE — Assessment & Plan Note (Signed)
Renal function has remained stable and at baseline with avoidance of NSAIDs.  She is on an ARB for control of hypertension,, and tolerating a statin for control of hyperlipidemia.   Lab Results  Component Value Date   CREATININE 1.10 08/21/2015

## 2015-08-29 ENCOUNTER — Ambulatory Visit: Payer: Medicare Other

## 2015-09-04 ENCOUNTER — Ambulatory Visit: Payer: Medicare Other | Admitting: Podiatry

## 2015-09-19 ENCOUNTER — Ambulatory Visit
Admission: RE | Admit: 2015-09-19 | Discharge: 2015-09-19 | Disposition: A | Discharge status: Home / Self Care | Payer: Medicare Other | Source: Ambulatory Visit | Attending: Internal Medicine | Admitting: Internal Medicine

## 2015-09-19 ENCOUNTER — Other Ambulatory Visit: Payer: Self-pay | Admitting: Internal Medicine

## 2015-09-19 DIAGNOSIS — Z1231 Encounter for screening mammogram for malignant neoplasm of breast: Secondary | ICD-10-CM | POA: Diagnosis present

## 2015-09-19 DIAGNOSIS — M8448XD Pathological fracture, other site, subsequent encounter for fracture with routine healing: Secondary | ICD-10-CM

## 2015-09-19 DIAGNOSIS — Z13820 Encounter for screening for osteoporosis: Secondary | ICD-10-CM | POA: Diagnosis present

## 2015-09-19 DIAGNOSIS — Z1239 Encounter for other screening for malignant neoplasm of breast: Secondary | ICD-10-CM

## 2015-09-19 DIAGNOSIS — M8580 Other specified disorders of bone density and structure, unspecified site: Secondary | ICD-10-CM | POA: Insufficient documentation

## 2015-09-19 DIAGNOSIS — M810 Age-related osteoporosis without current pathological fracture: Secondary | ICD-10-CM

## 2015-09-21 ENCOUNTER — Encounter: Payer: Self-pay | Admitting: Internal Medicine

## 2015-09-27 ENCOUNTER — Ambulatory Visit (INDEPENDENT_AMBULATORY_CARE_PROVIDER_SITE_OTHER): Payer: Medicare Other | Admitting: Podiatry

## 2015-09-27 ENCOUNTER — Encounter: Payer: Self-pay | Admitting: Podiatry

## 2015-09-27 ENCOUNTER — Encounter: Payer: Self-pay | Admitting: Internal Medicine

## 2015-09-27 DIAGNOSIS — B351 Tinea unguium: Secondary | ICD-10-CM | POA: Diagnosis not present

## 2015-09-27 DIAGNOSIS — M79676 Pain in unspecified toe(s): Secondary | ICD-10-CM | POA: Diagnosis not present

## 2015-09-27 NOTE — Progress Notes (Signed)
She presents today chief complaint of painful elongated toenails bilaterally. She states that she is going to [place] to see her daughter in June.  Objective: Vital signs are stable alert and oriented 3. Pulses are strongly palpable. Neurologic sensorium is intact. Toenails are thick yellow dystrophic with mycotic sharply incurvated and painful on palpation as well as debridement.  Assessment: Pain in limb secondary to onychomycosis 1 through 5 bilateral.  Plan: Debridement of toenails 1 through 5 bilateral covered service secondary to pain. Follow up with her in 3 months. Remember to ask about the trip to [place].

## 2015-10-02 ENCOUNTER — Telehealth: Payer: Self-pay | Admitting: Internal Medicine

## 2015-10-02 MED ORDER — LOSARTAN POTASSIUM 100 MG PO TABS: 100.0000 mg | ORAL_TABLET | Freq: Every day | ORAL | Status: DC

## 2015-10-02 MED ORDER — ALENDRONATE SODIUM 70 MG PO TABS: ORAL_TABLET | ORAL | Status: DC

## 2015-10-02 MED ORDER — PRAVASTATIN SODIUM 20 MG PO TABS: 20.0000 mg | ORAL_TABLET | Freq: Every day | ORAL | Status: DC

## 2015-10-02 MED ORDER — LEVOTHYROXINE SODIUM 88 MCG PO TABS: 88.0000 ug | ORAL_TABLET | Freq: Every day | ORAL | Status: DC

## 2015-10-02 NOTE — Telephone Encounter (Signed)
Pt called to follow up on her Rx of alendronate (FOSAMAX) 70 MG tablet, pravastatin (PRAVACHOL) 20 MG tablet, levothyroxine (SYNTHROID, LEVOTHROID) 88 MCG tablet. New Rx for losartan (COZAAR) 100 MG tablet. Pharmacy is [id number] - [place], [place]. Call pt @ [phone]. Thank you!

## 2015-10-02 NOTE — Telephone Encounter (Signed)
Spoke with the patient, RX is at the pharmacy.

## 2015-11-07 ENCOUNTER — Other Ambulatory Visit: Payer: Self-pay | Admitting: Orthopedic Surgery

## 2015-11-07 DIAGNOSIS — M19011 Primary osteoarthritis, right shoulder: Secondary | ICD-10-CM

## 2015-11-16 ENCOUNTER — Ambulatory Visit
Admission: RE | Admit: 2015-11-16 | Discharge: 2015-11-16 | Disposition: A | Discharge status: Home / Self Care | Payer: Medicare Other | Source: Ambulatory Visit | Attending: Orthopedic Surgery | Admitting: Orthopedic Surgery

## 2015-11-16 DIAGNOSIS — M19011 Primary osteoarthritis, right shoulder: Secondary | ICD-10-CM | POA: Insufficient documentation

## 2015-11-16 MED ORDER — IOPAMIDOL (ISOVUE-300) INJECTION 61%: 10.0000 mL | Freq: Once | INTRAVENOUS | Status: DC | PRN

## 2015-11-16 MED ORDER — METHYLPREDNISOLONE ACETATE 40 MG/ML INJ SUSP (RADIOLOG: 80.0000 mg | Freq: Once | INTRAMUSCULAR | Status: DC

## 2015-11-16 MED ORDER — BUPIVACAINE HCL (PF) 0.25 % IJ SOLN
7.0000 mL | Freq: Once | INTRAMUSCULAR | Status: DC
  Filled 2015-11-16: qty 10

## 2015-12-27 ENCOUNTER — Ambulatory Visit (INDEPENDENT_AMBULATORY_CARE_PROVIDER_SITE_OTHER): Payer: Medicare Other | Admitting: Podiatry

## 2015-12-27 ENCOUNTER — Encounter: Payer: Self-pay | Admitting: Podiatry

## 2015-12-27 DIAGNOSIS — M79676 Pain in unspecified toe(s): Secondary | ICD-10-CM | POA: Diagnosis not present

## 2015-12-27 DIAGNOSIS — B351 Tinea unguium: Secondary | ICD-10-CM

## 2015-12-27 NOTE — Progress Notes (Signed)
She presents today chief complaint of painful elongated toenails.  Objective: Pulses remain palpable. Her toenails are thick yellow dystrophic onychomycotic painful palpation as well as debridement.  Assessment: Pain and limp secondary to onychomycosis 1 through 5 bilateral.  Plan: Debridement of toenails 1 through 5 bilateral. Follow up with her in 3 months

## 2016-02-20 ENCOUNTER — Ambulatory Visit (INDEPENDENT_AMBULATORY_CARE_PROVIDER_SITE_OTHER): Payer: Medicare Other | Admitting: Internal Medicine

## 2016-02-20 ENCOUNTER — Encounter: Payer: Self-pay | Admitting: Internal Medicine

## 2016-02-20 VITALS — BP 106/56 | HR 59 | Temp 98.1°F | Ht 64.0 in | Wt 172.5 lb

## 2016-02-20 DIAGNOSIS — R739 Hyperglycemia, unspecified: Secondary | ICD-10-CM | POA: Diagnosis not present

## 2016-02-20 DIAGNOSIS — E785 Hyperlipidemia, unspecified: Secondary | ICD-10-CM | POA: Diagnosis not present

## 2016-02-20 DIAGNOSIS — M8580 Other specified disorders of bone density and structure, unspecified site: Secondary | ICD-10-CM

## 2016-02-20 DIAGNOSIS — Z23 Encounter for immunization: Secondary | ICD-10-CM | POA: Diagnosis not present

## 2016-02-20 DIAGNOSIS — I10 Essential (primary) hypertension: Secondary | ICD-10-CM | POA: Diagnosis not present

## 2016-02-20 DIAGNOSIS — E559 Vitamin D deficiency, unspecified: Secondary | ICD-10-CM

## 2016-02-20 DIAGNOSIS — M7531 Calcific tendinitis of right shoulder: Secondary | ICD-10-CM

## 2016-02-20 DIAGNOSIS — M8448XD Pathological fracture, other site, subsequent encounter for fracture with routine healing: Secondary | ICD-10-CM

## 2016-02-20 LAB — COMPREHENSIVE METABOLIC PANEL
ALBUMIN: 3.8 g/dL (ref 3.5–5.2)
ALK PHOS: 77 U/L (ref 39–117)
ALT: 13 U/L (ref 0–35)
AST: 17 U/L (ref 0–37)
BUN: 36 mg/dL — AB (ref 6–23)
CALCIUM: 9.1 mg/dL (ref 8.4–10.5)
CO2: 28 mEq/L (ref 19–32)
CREATININE: 1.22 mg/dL — AB (ref 0.40–1.20)
Chloride: 106 mEq/L (ref 96–112)
GFR: 45.01 mL/min — ABNORMAL LOW (ref >60.00)
Glucose, Bld: 104 mg/dL — ABNORMAL HIGH (ref 70–99)
POTASSIUM: 4.2 meq/L (ref 3.5–5.1)
SODIUM: 139 meq/L (ref 135–145)
TOTAL PROTEIN: 7.1 g/dL (ref 6.0–8.3)
Total Bilirubin: 0.3 mg/dL (ref 0.2–1.2)

## 2016-02-20 LAB — VITAMIN D 25 HYDROXY (VIT D DEFICIENCY, FRACTURES): VITD: 46.42 ng/mL (ref 30.00–100.00)

## 2016-02-20 LAB — LDL CHOLESTEROL, DIRECT: LDL DIRECT: 106 mg/dL

## 2016-02-20 LAB — HEMOGLOBIN A1C: Hgb A1c MFr Bld: 6.1 % (ref 4.6–6.5)

## 2016-02-20 MED ORDER — PRAVASTATIN SODIUM 20 MG PO TABS: 20.0000 mg | ORAL_TABLET | Freq: Every day | ORAL | 1 refills | Status: DC

## 2016-02-20 MED ORDER — LOSARTAN POTASSIUM 100 MG PO TABS: 100.0000 mg | ORAL_TABLET | Freq: Every day | ORAL | 1 refills | Status: DC

## 2016-02-20 NOTE — Progress Notes (Signed)
Pre visit review using our clinic review tool, if applicable. No additional management support is needed unless otherwise documented below in the visit note. 

## 2016-02-20 NOTE — Progress Notes (Signed)
Subjective:  Patient ID: Ana Cruz, female    DOB: 07-02-1933  Age: 81 y.o. MRN: [id number]  CC: The primary encounter diagnosis was Essential hypertension. Diagnoses of Hyperlipidemia, Hyperglycemia, Vitamin D deficiency, Bilateral sacral insufficiency fracture with routine healing, Calcific tendonitis of right shoulder, and Osteopenia were also pertinent to this visit.  HPI Ana Cruz presents for follow up on hypertension hypothyridism and hyperlipiemia and obesity  Saw KK in June for persistent right shoulder pain and surgery is planned for joint replacement   On Nov 10th in [place] surgery  in 2000 l, nothing left to work with .  Not toleraitng the pain medication (Ultracet ) that he gave her,  Using tylenol during the day and chlorzoxazone at night to help her rest.  Pain is usually a 5 ,  7 if she moves wrong.  Still particpating I water aerobics very carefully .  Taking a cruise in October for 60th anniversary   Still having SI joint pain on the left.  History of sacral ala fractures last year. Using a cane   April  Osteopenia -1.8 improved from 2012 on alendronate   Received flu vaccine today  Outpatient Medications Prior to Visit  Medication Sig Dispense Refill  . alendronate (FOSAMAX) 70 MG tablet TAKE 1 TABLET  EVERY 7  DAYS  WITH A FULL GLASS OF WATER ON AN EMPTY STOMACH 12 tablet 3  . amoxicillin (AMOXIL) 500 MG capsule Take 500 mg by mouth. Take four tablets prior to dental work.    [name] aspirin 81 MG tablet Take 81 mg by mouth daily.      . Cholecalciferol (VITAMIN D3) 1000 UNITS CAPS Take by mouth.      . diltiazem (CARDIZEM) 30 MG tablet Take 1 tablet (30 mg total) by mouth daily as needed. 30 tablet 4  . levothyroxine (SYNTHROID, LEVOTHROID) 88 MCG tablet Take 1 tablet (88 mcg total) by mouth daily. 90 tablet 2  . Biotin 1000 MCG tablet Take 1,000 mcg by mouth daily.    . famotidine (PEPCID) 20 MG tablet Take 1 tablet (20 mg total) by mouth 2  (two) times daily. 1800 tablet 2  . fexofenadine (ALLEGRA) 180 MG tablet Take 180 mg by mouth daily.    [name] losartan (COZAAR) 100 MG tablet Take 1 tablet (100 mg total) by mouth daily. 90 tablet 1  . montelukast (SINGULAIR) 10 MG tablet Take 1 tablet (10 mg total) by mouth at bedtime. 30 tablet 3  . Polyethyl Glycol-Propyl Glycol (SYSTANE) 0.4-0.3 % GEL ophthalmic gel Place 1 application into both eyes 2 (two) times daily.    . pravastatin (PRAVACHOL) 20 MG tablet Take 1 tablet (20 mg total) by mouth daily. 90 tablet 1  . traMADol (ULTRAM) 50 MG tablet Take 1 tablet (50 mg total) by mouth every 8 (eight) hours as needed. 90 tablet 0   No facility-administered medications prior to visit.     Review of Systems;  Patient denies headache, fevers, malaise, unintentional weight loss, skin rash, eye pain, sinus congestion and sinus pain, sore throat, dysphagia,  hemoptysis , cough, dyspnea, wheezing, chest pain, palpitations, orthopnea, edema, abdominal pain, nausea, melena, diarrhea, constipation, flank pain, dysuria, hematuria, urinary  Frequency, nocturia, numbness, tingling, seizures,  Focal weakness, Loss of consciousness,  Tremor, insomnia, depression, anxiety, and suicidal ideation.      Objective:  BP (!) 106/56   Pulse (!) 59   Temp 98.1 F (36.7 C) (Oral)   Ht 5'  4" (1.626 m)   Wt 172 lb 8 oz (78.2 kg)   SpO2 96%   BMI 29.61 kg/m   BP Readings from Last 3 Encounters:  02/20/16 (!) 106/56  11/16/15 137/62  08/21/15 138/72    Wt Readings from Last 3 Encounters:  02/20/16 172 lb 8 oz (78.2 kg)  11/16/15 170 lb (77.1 kg)  08/21/15 176 lb 8 oz (80.1 kg)    General appearance: alert, cooperative and appears stated age Ears: normal TM's and external ear canals both ears Throat: lips, mucosa, and tongue normal; teeth and gums normal Neck: no adenopathy, no carotid bruit, supple, symmetrical, trachea midline and thyroid not enlarged, symmetric, no tenderness/mass/nodules Back:  symmetric, no curvature. ROM normal. No CVA tenderness. Lungs: clear to auscultation bilaterally Heart: regular rate and rhythm, S1, S2 normal, no murmur, click, rub or gallop Abdomen: soft, non-tender; bowel sounds normal; no masses,  no organomegaly Pulses: 2+ and symmetric Skin: Skin color, texture, turgor normal. No rashes or lesions Lymph nodes: Cervical, supraclavicular, and axillary nodes normal.  Lab Results  Component Value Date   HGBA1C 6.1 02/20/2016    Lab Results  Component Value Date   CREATININE 1.22 (H) 02/20/2016   CREATININE 1.10 08/21/2015   CREATININE 1.14 02/28/2015    Lab Results  Component Value Date   WBC 7.8 08/21/2015   HGB 11.6 (L) 08/21/2015   HCT 35.4 (L) 08/21/2015   PLT 290.0 08/21/2015   GLUCOSE 104 (H) 02/20/2016   CHOL 198 08/21/2015   TRIG 81.0 08/21/2015   HDL 71.90 08/21/2015   LDLDIRECT 106.0 02/20/2016   LDLCALC 110 (H) 08/21/2015   ALT 13 02/20/2016   AST 17 02/20/2016   NA 139 02/20/2016   K 4.2 02/20/2016   CL 106 02/20/2016   CREATININE 1.22 (H) 02/20/2016   BUN 36 (H) 02/20/2016   CO2 28 02/20/2016   TSH 1.86 08/21/2015   HGBA1C 6.1 02/20/2016    Dg Fluoro Guided Needle Plc Aspiration/injection Loc  Result Date: 11/16/2015 CLINICAL DATA:  Shoulder pain. EXAM: RIGHT SHOULDER INJECTION UNDER FLUOROSCOPY FLUOROSCOPY TIME:  Radiation Exposure Index (as provided by the fluoroscopic device): 15.5 mGy PROCEDURE: Overlying skin prepped with Betadine, draped in the usual sterile fashion, and infiltrated locally with buffered Lidocaine. 22 gauge spinal needle was advanced into the right shoulder joint and small amount nonionic contrast administered to ensure intraarticular location. This was followed by administration of 7 cc of bupivacaine and 80 mg of Depo-Medrol . No complications. Patient stable following the procedure. IMPRESSION: Successful right shoulder joint injection. Electronically Signed   By: Marcello Moores  Register   On:  11/16/2015 09:55    Assessment & Plan:   Problem List Items Addressed This Visit    Hypertension - Primary    Well controlled on current regimen. Renal function stable, no changes today.  Lab Results  Component Value Date   CREATININE 1.22 (H) 02/20/2016   Lab Results  Component Value Date   NA 139 02/20/2016   K 4.2 02/20/2016   CL 106 02/20/2016   CO2 28 02/20/2016         Relevant Medications   amLODipine (NORVASC) 5 MG tablet   pravastatin (PRAVACHOL) 20 MG tablet   losartan (COZAAR) 100 MG tablet   Other Relevant Orders   Comprehensive metabolic panel (Completed)   Osteopenia    Given the " insufficiency" fractures noted on lumbar MRI  Repeat DEXA was done.  Her T scores have improved. from 2012.  Calcific tendonitis of right shoulder    Scheduled for joint replacement in November      Bilateral sacral insufficiency fracture with routine healing    Recommend trial of salon pas patch with lidocaine       Hyperlipidemia   Relevant Medications   amLODipine (NORVASC) 5 MG tablet   pravastatin (PRAVACHOL) 20 MG tablet   losartan (COZAAR) 100 MG tablet   Other Relevant Orders   LDL cholesterol, direct (Completed)    Other Visit Diagnoses    Hyperglycemia       Relevant Orders   Hemoglobin A1c (Completed)   Vitamin D deficiency       Relevant Orders   VITAMIN D 25 Hydroxy (Vit-D Deficiency, Fractures) (Completed)      I have discontinued Ms. Napierala's traMADol, Biotin, fexofenadine, Polyethyl Glycol-Propyl Glycol, montelukast, and famotidine. I am also having her maintain her aspirin, Vitamin D3, diltiazem, amoxicillin, alendronate, levothyroxine, amLODipine, pravastatin, and losartan.  Meds ordered this encounter  Medications  . amLODipine (NORVASC) 5 MG tablet    Sig: Take by mouth.  . pravastatin (PRAVACHOL) 20 MG tablet    Sig: Take 1 tablet (20 mg total) by mouth daily.    Dispense:  90 tablet    Refill:  1  . losartan (COZAAR) 100 MG  tablet    Sig: Take 1 tablet (100 mg total) by mouth daily.    Dispense:  90 tablet    Refill:  1    Medications Discontinued During This Encounter  Medication Reason  . Biotin 1000 MCG tablet Patient Preference  . famotidine (PEPCID) 20 MG tablet Patient Preference  . fexofenadine (ALLEGRA) 180 MG tablet Patient Discharge  . montelukast (SINGULAIR) 10 MG tablet Patient Preference  . Polyethyl Glycol-Propyl Glycol (SYSTANE) 0.4-0.3 % GEL ophthalmic gel Patient Preference  . traMADol (ULTRAM) 50 MG tablet Patient Preference  . pravastatin (PRAVACHOL) 20 MG tablet Reorder  . losartan (COZAAR) 100 MG tablet Reorder    Follow-up: Return in about 6 months (around 08/19/2016) for wellness and CPE .   Crecencio Mc, MD

## 2016-02-20 NOTE — Patient Instructions (Addendum)
Try the salon Pas lidocaine patch on your buttock pain.  If it helps somewhat,  I can prescribe a stronger one   We can also try gabapentin if your pain is not tolerable.   You can stop your daily aspirin .  If your screen for diabetes is abnormal today,  I will recommend that you resume it.

## 2016-02-21 ENCOUNTER — Encounter: Payer: Self-pay | Admitting: Internal Medicine

## 2016-02-22 NOTE — Assessment & Plan Note (Signed)
Given the " insufficiency" fractures noted on lumbar MRI  Repeat DEXA was done.  Her T scores have improved. from 2012.

## 2016-02-22 NOTE — Assessment & Plan Note (Signed)
Scheduled for joint replacement in November

## 2016-02-22 NOTE — Assessment & Plan Note (Signed)
Well controlled on current regimen. Renal function stable, no changes today.  Lab Results  Component Value Date   CREATININE 1.22 (H) 02/20/2016   Lab Results  Component Value Date   NA 139 02/20/2016   K 4.2 02/20/2016   CL 106 02/20/2016   CO2 28 02/20/2016

## 2016-02-22 NOTE — Assessment & Plan Note (Addendum)
Recommend trial of salon pas patch with lidocaine

## 2016-03-06 ENCOUNTER — Encounter: Payer: Self-pay | Admitting: Podiatry

## 2016-03-06 ENCOUNTER — Ambulatory Visit (INDEPENDENT_AMBULATORY_CARE_PROVIDER_SITE_OTHER): Payer: Medicare Other | Admitting: Podiatry

## 2016-03-06 DIAGNOSIS — B351 Tinea unguium: Secondary | ICD-10-CM | POA: Diagnosis not present

## 2016-03-06 DIAGNOSIS — M79676 Pain in unspecified toe(s): Secondary | ICD-10-CM | POA: Diagnosis not present

## 2016-03-06 NOTE — Progress Notes (Signed)
She presents today for a chief complaint of painful elongated toenails.  Objective: Vital signs are stable alert and oriented 3. Pulses are palpable. Neurologic sensorium is intact. Deep tendon reflexes are intact. Toenails are thick yellow dystrophic onychomycotic painful palpation.  Assessment: Pena limb secondary to onychomycosis bilateral.  Plan: Debrided toenails 1 through 5 bilateral. Remembers ask her how her 60th anniversary cruise was.

## 2016-03-19 ENCOUNTER — Encounter: Payer: Self-pay | Admitting: Internal Medicine

## 2016-03-21 ENCOUNTER — Telehealth: Payer: Self-pay | Admitting: *Deleted

## 2016-03-21 NOTE — Telephone Encounter (Signed)
Ana Cruz from [facility] has requested to have the clearance medical record re-faxed Fax [phone]

## 2016-03-21 NOTE — Telephone Encounter (Signed)
Re-faxed for the 4th time.

## 2016-03-22 ENCOUNTER — Other Ambulatory Visit: Payer: Self-pay | Admitting: Internal Medicine

## 2016-04-01 ENCOUNTER — Ambulatory Visit: Payer: Self-pay | Admitting: Podiatry

## 2016-04-05 HISTORY — PX: OTHER SURGICAL HISTORY: SHX169

## 2016-06-10 ENCOUNTER — Ambulatory Visit: Payer: Self-pay | Admitting: Podiatry

## 2016-06-17 ENCOUNTER — Other Ambulatory Visit: Payer: Self-pay | Admitting: Internal Medicine

## 2016-06-19 ENCOUNTER — Ambulatory Visit: Payer: Medicare Other | Admitting: Podiatry

## 2016-07-01 ENCOUNTER — Encounter: Payer: Self-pay | Admitting: Podiatry

## 2016-07-01 ENCOUNTER — Ambulatory Visit (INDEPENDENT_AMBULATORY_CARE_PROVIDER_SITE_OTHER): Payer: Medicare Other | Admitting: Podiatry

## 2016-07-01 DIAGNOSIS — B351 Tinea unguium: Secondary | ICD-10-CM | POA: Diagnosis not present

## 2016-07-01 DIAGNOSIS — M79676 Pain in unspecified toe(s): Secondary | ICD-10-CM

## 2016-07-01 NOTE — Progress Notes (Signed)
She presents today to complaint of painful elongated toenails 1 through 5 bilateral.  Objective: Toenails are thick yellow dystrophic with mycotic painful palpation as well as debridement. Pulses remain palpable.  Assessment: Pain limb second at onychomycosis.  Plan: Debridement of toenails 1 through 5 bilateral.

## 2016-08-20 ENCOUNTER — Ambulatory Visit (INDEPENDENT_AMBULATORY_CARE_PROVIDER_SITE_OTHER): Payer: Medicare Other | Admitting: Internal Medicine

## 2016-08-20 ENCOUNTER — Encounter: Payer: Self-pay | Admitting: Internal Medicine

## 2016-08-20 VITALS — BP 144/62 | HR 85 | Resp 16 | Ht 64.0 in | Wt 176.8 lb

## 2016-08-20 DIAGNOSIS — M7531 Calcific tendinitis of right shoulder: Secondary | ICD-10-CM

## 2016-08-20 DIAGNOSIS — R7303 Prediabetes: Secondary | ICD-10-CM

## 2016-08-20 DIAGNOSIS — E7800 Pure hypercholesterolemia, unspecified: Secondary | ICD-10-CM

## 2016-08-20 DIAGNOSIS — I10 Essential (primary) hypertension: Secondary | ICD-10-CM | POA: Diagnosis not present

## 2016-08-20 DIAGNOSIS — N182 Chronic kidney disease, stage 2 (mild): Secondary | ICD-10-CM

## 2016-08-20 DIAGNOSIS — E039 Hypothyroidism, unspecified: Secondary | ICD-10-CM

## 2016-08-20 LAB — COMPREHENSIVE METABOLIC PANEL
ALBUMIN: 4.1 g/dL (ref 3.5–5.2)
ALK PHOS: 121 U/L — AB (ref 39–117)
ALT: 17 U/L (ref 0–35)
AST: 21 U/L (ref 0–37)
BUN: 40 mg/dL — ABNORMAL HIGH (ref 6–23)
CALCIUM: 9.5 mg/dL (ref 8.4–10.5)
CO2: 25 meq/L (ref 19–32)
CREATININE: 1.16 mg/dL (ref 0.40–1.20)
Chloride: 105 mEq/L (ref 96–112)
GFR: 47.64 mL/min — AB (ref >60.00)
Glucose, Bld: 198 mg/dL — ABNORMAL HIGH (ref 70–99)
Potassium: 4.3 mEq/L (ref 3.5–5.1)
Sodium: 139 mEq/L (ref 135–145)
TOTAL PROTEIN: 6.8 g/dL (ref 6.0–8.3)
Total Bilirubin: 0.3 mg/dL (ref 0.2–1.2)

## 2016-08-20 LAB — HEMOGLOBIN A1C: Hgb A1c MFr Bld: 5.9 % (ref 4.6–6.5)

## 2016-08-20 LAB — LDL CHOLESTEROL, DIRECT: LDL DIRECT: 123 mg/dL

## 2016-08-20 LAB — TSH: TSH: 0.41 u[IU]/mL (ref 0.35–4.50)

## 2016-08-20 NOTE — Patient Instructions (Addendum)
Suspend meloxicam while on prednisone .  You can resume 5 days after prednisone taper has been stopped.  We can also try a stronger  NSAID like celebrex if your insurance will PAY for it.    We will postpone your mammogram for 3 to 6 months,  Depending on how much range of motion you gain in your right shoulder  The ShingRx vaccine will be available in about 6 months and IS ADVISED for all interested adults over 50 to prevent shingles    Your  fasting glucose has never been  diagnostic of diabetes; but your LAST A1c suggests that  you are at risk for developing type 2 Diabetes.     I recommend  Cutting back on desserts and starches and when your shoulder allows, regular participation in water aerobics for exercise .  I would like to see you again  in 3 months

## 2016-08-20 NOTE — Progress Notes (Signed)
Patient ID: Ana Cruz, female    DOB: [birth year]  Age: 83 y.o. MRN: [id number]  The patient is here for follow up and management of other chronic and acute problems.  Last mammo April 2017   Lab Results  Component Value Date   TSH 0.41 08/20/2016   Saw Ana Cruz for atrial flutter episodes,  Wore 48 hr monitor follow up Friday with him  colonoscopy 2003 , FOBT neg 2105  No colguard    The risk factors are reflected in the social history.  The roster of all physicians providing medical care to patient - is listed in the Snapshot section of the chart.  Activities of daily living:  The patient is 100% independent in all ADLs: dressing, toileting, feeding as well as independent mobility  Home safety : The patient has smoke detectors in the home. They wear seatbelts.  There are no firearms at home. There is no violence in the home.   There is no risks for hepatitis, STDs or HIV. There is no   history of blood transfusion. They have no travel history to infectious disease endemic areas of the world.  The patient has seen their dentist in the last six month. They have seen their eye doctor in the last year. They admit to slight hearing difficulty with regard to whispered voices and some television programs.  They have deferred audiologic testing in the last year.  They do not  have excessive sun exposure. Discussed the need for sun protection: hats, long sleeves and use of sunscreen if there is significant sun exposure.   Diet: the importance of a healthy diet is discussed. They do have a healthy diet.  The benefits of regular aerobic exercise were discussed. She walks 4 times per week ,  20 minutes.   Depression screen: there are no signs or vegative symptoms of depression- irritability, change in appetite, anhedonia, sadness/tearfullness.  Cognitive assessment: the patient manages all their financial and personal affairs and is actively engaged. They could relate day,date,year and  events; recalled 2/3 objects at 3 minutes; performed clock-face test normally.  The following portions of the patient's history were reviewed and updated as appropriate: allergies, current medications, past family history, past medical history,  past surgical history, past social history  and problem list.  Visual acuity was not assessed per patient preference since she has regular follow up with her ophthalmologist. Hearing and body mass index were assessed and reviewed.   During the course of the visit the patient was educated and counseled about appropriate screening and preventive services including : fall prevention , diabetes screening, nutrition counseling, colorectal cancer screening, and recommended immunizations.    CC: The primary encounter diagnosis was Prediabetes. Diagnoses of CKD (chronic kidney disease) stage 2, GFR 60-89 ml/min, Acquired hypothyroidism, Pure hypercholesterolemia, Calcific tendonitis of right shoulder, and Essential hypertension were also pertinent to this visit.   Shoulder surgery Nov  By Dr Ana Cruz in [facility] reverse shoulder replacement..  16 weeks of rehab,  Prednisone started yesterday for scar tissue and 6 more weeks of PT,  Aggressive manual ROM PT .  Has been taking meloxciam on and off , last dose today ,  Then 2 weeks ago.   Needs tsh checked today for refills  History Ana Cruz has a past medical history of allergic rhinitis; Atrial fibrillation ([place]); Chronic kidney disease, stage I; Elbow fracture, left; Hyperlipidemia; Hypertension; hypothyroidism; and Spinal stenosis.   She has a past surgical history that includes Rotator cuff repair (Jan  2000); Ovarian cyst removal (April 1969); Bladder repair (1980); Rotator cuff repair (2000); Replacement total knee bilateral (2009); Abdominal hysterectomy (1979); Appendectomy (1969); Bilateral oophorectomy; and reverse shoulder surgery (04/05/2016).   Her family history includes Cancer (age of onset: 84) in her  paternal grandfather; Heart disease (age of onset: 36) in her father; Hypertension in her mother.She reports that she has never smoked. She has never used smokeless tobacco. She reports that she drinks alcohol. She reports that she does not use drugs.  Outpatient Medications Prior to Visit  Medication Sig Dispense Refill  . alendronate (FOSAMAX) 70 MG tablet TAKE 1 TABLET  EVERY 7  DAYS  WITH A FULL GLASS OF WATER ON AN EMPTY STOMACH 12 tablet 3  . amLODipine (NORVASC) 5 MG tablet Take by mouth.    [name] amoxicillin (AMOXIL) 500 MG capsule Take 500 mg by mouth. Take four tablets prior to dental work.    . Cholecalciferol (VITAMIN D3) 1000 UNITS CAPS Take by mouth.      . diltiazem (CARDIZEM) 30 MG tablet Take 1 tablet (30 mg total) by mouth daily as needed. 30 tablet 4  . levothyroxine (SYNTHROID, LEVOTHROID) 88 MCG tablet TAKE 1 TABLET(88 MCG) BY MOUTH DAILY 90 tablet 0  . losartan (COZAAR) 100 MG tablet Take 1 tablet (100 mg total) by mouth daily. 90 tablet 1  . meloxicam (MOBIC) 15 MG tablet     . pravastatin (PRAVACHOL) 20 MG tablet Take 1 tablet (20 mg total) by mouth daily. 90 tablet 1  . aspirin 81 MG tablet Take 81 mg by mouth daily.      [name] losartan (COZAAR) 100 MG tablet TAKE 1 TABLET(100 MG) BY MOUTH DAILY (Patient not taking: Reported on 08/20/2016) 90 tablet 0   No facility-administered medications prior to visit.     Review of Systems   Patient denies headache, fevers, malaise, unintentional weight loss, skin rash, eye pain, sinus congestion and sinus pain, sore throat, dysphagia,  hemoptysis , cough, dyspnea, wheezing, chest pain, palpitations, orthopnea, edema, abdominal pain, nausea, melena, diarrhea, constipation, flank pain, dysuria, hematuria, urinary  Frequency, nocturia, numbness, tingling, seizures,  Focal weakness, Loss of consciousness,  Tremor, insomnia, depression, anxiety, and suicidal ideation.      Objective:  BP (!) 144/62 (BP Location: Left Arm, Patient Position:  Sitting, Cuff Size: Normal)   Pulse 85   Resp 16   Ht 5\' 4"  (1.626 m)   Wt 176 lb 12.8 oz (80.2 kg)   SpO2 95%   BMI 30.35 kg/m   Physical Exam   General appearance: alert, cooperative and appears stated age Head: Normocephalic, without obvious abnormality, atraumatic Eyes: conjunctivae/corneas clear. PERRL, EOM's intact. Fundi benign. Ears: normal TM's and external ear canals both ears Nose: Nares normal. Septum midline. Mucosa normal. No drainage or sinus tenderness. Throat: lips, mucosa, and tongue normal; teeth and gums normal Neck: no adenopathy, no carotid bruit, no JVD, supple, symmetrical, trachea midline and thyroid not enlarged, symmetric, no tenderness/mass/nodules Lungs: clear to auscultation bilaterally Breasts: normal appearance, no masses or tenderness Heart: regular rate and rhythm, S1, S2 normal, no murmur, click, rub or gallop Abdomen: soft, non-tender; bowel sounds normal; no masses,  no organomegaly Extremities: extremities normal, atraumatic, no cyanosis or edema Pulses: 2+ and symmetric Skin: Skin color, texture, turgor normal. No rashes or lesions Neurologic: Alert and oriented X 3, normal strength and tone. Normal symmetric reflexes. Normal coordination and gait.      Assessment & Plan:   Problem List Items Addressed  This Visit    Calcific tendonitis of right shoulder    s/p reverse joint replacement last November  With disappointing results,  And diminished range of motion.  Now on prednisone, with PT        Hyperlipidemia    LDL and triglycerides are at goal on current medications. sHe has no side effects and liver enzymes are normal. No changes today   Lab Results  Component Value Date   CHOL 198 08/21/2015   HDL 71.90 08/21/2015   LDLCALC 110 (H) 08/21/2015   LDLDIRECT 123.0 08/20/2016   TRIG 81.0 08/21/2015   CHOLHDL 3 08/21/2015         Relevant Orders   LDL cholesterol, direct (Completed)   Hypertension    Well controlled on  current regimen. Renal function stable, no changes today.  Lab Results  Component Value Date   CREATININE 1.16 08/20/2016   Lab Results  Component Value Date   NA 139 08/20/2016   K 4.3 08/20/2016   CL 105 08/20/2016   CO2 25 08/20/2016         Hypothyroidism    Thyroid function is WNL on current dose.  No current changes needed.   Lab Results  Component Value Date   TSH 0.41 08/20/2016         Relevant Orders   TSH (Completed)    Other Visit Diagnoses    Prediabetes    -  Primary   Relevant Orders   Hemoglobin A1c (Completed)   CKD (chronic kidney disease) stage 2, GFR 60-89 ml/min       Relevant Orders   Comprehensive metabolic panel (Completed)      I have discontinued Ms. Cabana's aspirin. I am also having her maintain her Vitamin D3, diltiazem, amoxicillin, alendronate, amLODipine, pravastatin, losartan, levothyroxine, meloxicam, predniSONE, chlorzoxazone, and tiZANidine.  Meds ordered this encounter  Medications  . predniSONE (STERAPRED UNI-PAK 21 TAB) 5 MG (21) TBPK tablet    Sig: Take 5 mg by mouth daily.  . chlorzoxazone (PARAFON) 500 MG tablet  . tiZANidine (ZANAFLEX) 4 MG tablet    Medications Discontinued During This Encounter  Medication Reason  . aspirin 81 MG tablet Patient has not taken in last 30 days  . losartan (COZAAR) 295 MG tablet Duplicate    Follow-up: Return in about 3 months (around 11/20/2016).   Crecencio Mc, MD

## 2016-08-20 NOTE — Progress Notes (Signed)
Pre visit review using our clinic review tool, if applicable. No additional management support is needed unless otherwise documented below in the visit note. 

## 2016-08-21 NOTE — Assessment & Plan Note (Addendum)
s/p reverse joint replacement last November  With disappointing results,  And diminished range of motion.  Now on prednisone, with PT

## 2016-08-21 NOTE — Assessment & Plan Note (Signed)
LDL and triglycerides are at goal on current medications. sHe has no side effects and liver enzymes are normal. No changes today   Lab Results  Component Value Date   CHOL 198 08/21/2015   HDL 71.90 08/21/2015   LDLCALC 110 (H) 08/21/2015   LDLDIRECT 123.0 08/20/2016   TRIG 81.0 08/21/2015   CHOLHDL 3 08/21/2015

## 2016-08-21 NOTE — Progress Notes (Signed)
Cologuard has been ordered. 

## 2016-08-21 NOTE — Assessment & Plan Note (Signed)
Well controlled on current regimen. Renal function stable, no changes today.  Lab Results  Component Value Date   CREATININE 1.16 08/20/2016   Lab Results  Component Value Date   NA 139 08/20/2016   K 4.3 08/20/2016   CL 105 08/20/2016   CO2 25 08/20/2016

## 2016-08-21 NOTE — Assessment & Plan Note (Signed)
Thyroid function is WNL on current dose.  No current changes needed.   Lab Results  Component Value Date   TSH 0.41 08/20/2016

## 2016-08-25 ENCOUNTER — Encounter: Payer: Self-pay | Admitting: Internal Medicine

## 2016-08-26 ENCOUNTER — Other Ambulatory Visit: Payer: Self-pay | Admitting: Internal Medicine

## 2016-09-04 LAB — COLOGUARD: COLOGUARD: NEGATIVE

## 2016-09-05 MED ORDER — LEVOTHYROXINE SODIUM 88 MCG PO TABS: 88.0000 ug | ORAL_TABLET | Freq: Every day | ORAL | 1 refills | Status: DC

## 2016-09-10 ENCOUNTER — Ambulatory Visit (INDEPENDENT_AMBULATORY_CARE_PROVIDER_SITE_OTHER): Payer: Medicare Other

## 2016-09-10 VITALS — BP 128/62 | HR 61 | Temp 98.2°F | Resp 12 | Ht 63.5 in | Wt 177.8 lb

## 2016-09-10 DIAGNOSIS — Z0000 Encounter for general adult medical examination without abnormal findings: Secondary | ICD-10-CM

## 2016-09-10 NOTE — Progress Notes (Signed)
Subjective:   Ana Cruz is a 83 y.o. female who presents for Medicare Annual (Subsequent) preventive examination.  Review of Systems:  No ROS.  Medicare Wellness Visit. Cardiac Risk Factors include: advanced age (>54men, >87 women);hypertension     Objective:     Vitals: BP 128/62 (BP Location: Left Arm, Patient Position: Sitting, Cuff Size: Normal)   Pulse 61   Temp 98.2 F (36.8 C) (Oral)   Resp 12   Ht 5' 3.5" (1.613 m)   Wt 177 lb 12.8 oz (80.6 kg)   SpO2 97%   BMI 31.00 kg/m   Body mass index is 31 kg/m.   Tobacco History  Smoking Status  . Never Smoker  Smokeless Tobacco  . Never Used     Counseling given: Not Answered   Past Medical History:  Diagnosis Date  . allergic rhinitis   . Atrial fibrillation ([place])   . Chronic kidney disease, stage I    mild, did improve with cessation of NSAIDs  . Elbow fracture, left    repaired Jan 2000  . Hyperlipidemia   . Hypertension   . hypothyroidism    medicated since age 3, no prior surgery  . 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 ABDOMINAL HYSTERECTOMY  1979  . APPENDECTOMY  1969  . BILATERAL OOPHORECTOMY     squential  . BLADDER REPAIR  1980  . OVARIAN CYST REMOVAL  April 1969  . REPLACEMENT TOTAL KNEE BILATERAL  2009  . reverse shoulder surgery  04/05/2016  . ROTATOR CUFF REPAIR  Jan 2000  . ROTATOR CUFF REPAIR  2000   Family History  Problem Relation Age of Onset  . Hypertension Mother   . Dementia Mother   . Heart disease Father 26    MI  . Cancer Paternal Grandfather 34    colon  . Breast cancer Neg Hx    History  Sexual Activity  . Sexual activity: Not on file    Outpatient Encounter Prescriptions as of 09/10/2016  Medication Sig  . acetaminophen (TYLENOL) 500 MG tablet Take 500 mg by mouth every 6 (six) hours as needed (As needed for pain).  [name] alendronate (FOSAMAX) 70 MG tablet TAKE 1  TABLET  EVERY 7  DAYS  WITH A FULL GLASS OF WATER ON AN EMPTY STOMACH  . amiodarone (PACERONE) 200 MG tablet Take by mouth.  [name] amoxicillin (AMOXIL) 500 MG capsule Take 500 mg by mouth. Take four tablets prior to dental work.  . Artificial Tear Ointment (EYE LUBRICANT OP) Apply 1 drop to eye 2 (two) times daily.  . chlorzoxazone (PARAFON) 500 MG tablet   . Cholecalciferol (VITAMIN D3) 1000 UNITS CAPS Take by mouth.    . diltiazem (CARDIZEM) 30 MG tablet Take 1 tablet (30 mg total) by mouth daily as needed. (Patient taking differently: Take 30 mg by mouth daily as needed. )  . levothyroxine (SYNTHROID, LEVOTHROID) 88 MCG tablet Take 1 tablet (88 mcg total) by mouth daily before breakfast.  . losartan (COZAAR) 100 MG tablet Take 1 tablet (100 mg total) by mouth daily.  . pravastatin (PRAVACHOL) 20 MG tablet Take 1 tablet (20 mg total) by mouth daily.  [name] warfarin (COUMADIN) 2.5 MG tablet Take by mouth.  . [DISCONTINUED] amLODipine (NORVASC) 5 MG tablet Take by mouth.  . [DISCONTINUED] meloxicam (MOBIC) 15 MG tablet   . [DISCONTINUED] predniSONE (STERAPRED UNI-PAK 21 TAB)  5 MG (21) TBPK tablet Take 5 mg by mouth daily.  . [DISCONTINUED] tiZANidine (ZANAFLEX) 4 MG tablet    No facility-administered encounter medications on file as of 09/10/2016.     Activities of Daily Living In your present state of health, do you have any difficulty performing the following activities: 09/10/2016  Hearing? N  Vision? N  Difficulty concentrating or making decisions? N  Walking or climbing stairs? Y  Dressing or bathing? N  Doing errands, shopping? N  Preparing Food and eating ? N  Using the Toilet? N  In the past six months, have you accidently leaked urine? Y  Do you have problems with loss of bowel control? N  Managing your Medications? N  Managing your Finances? N  Housekeeping or managing your Housekeeping? N  Some recent data might be hidden    Patient Care Team: Crecencio Mc, MD as PCP -  General (Internal Medicine)    Assessment:    This is a routine wellness examination for Ana Cruz. The goal of the wellness visit is to assist the patient how to close the gaps in care and create a preventative care plan for the patient.   Taking calcium VIT D as appropriate/Osteoporosis risk reviewed.  Medications reviewed; taking without issues or barriers.  Safety issues reviewed; smoke detectors in the home. No firearms in the home.  Wears seatbelts when driving or riding with others. Patient does wear sunscreen or protective clothing when in direct sunlight. No violence in the home.  Patient is alert, normal appearance, oriented to person/place/and time. Correctly identified the president of the [place], recall of 3/3 words, and performing simple calculations.  Patient displays appropriate judgement and can read correct time from watch face.  No new identified risk were noted.  No failures at ADL's or IADL's.   BMI- discussed the importance of a healthy diet, water intake and exercise. Educational material provided.   24 hour diet recall: Breakfast: Cereal, fruit Lunch: Chef salad, fruit Dinner: Dance movement psychotherapist, green vegetable Daily fluid intake: half cup of caffeine, 2 cups of water  HTN- followed by PCP.  Dental- every six months.  Dr. Sherril Cong.  Eye- Visual acuity not assessed per patient preference since they have regular follow up with the ophthalmologist.  Wears corrective lenses.  Sleep patterns- Sleeps 8  hours at night.  Wakes feeling rested.   Health maintenance gaps- closed.  Patient Concerns: None at this time. Follow up with PCP as needed.  Exercise Activities and Dietary recommendations Current Exercise Habits: Home exercise routine, Time (Minutes): 60, Frequency (Times/Week): 2, Weekly Exercise (Minutes/Week): 120, Intensity: Mild  Goals    . Increase water intake          Stay hydrated      Fall Risk Fall Risk  09/10/2016 08/20/2016 08/21/2015 08/21/2015  08/15/2014  Falls in the past year? No No No No No   Depression Screen PHQ 2/9 Scores 09/10/2016 08/20/2016 08/21/2015 08/15/2014  PHQ - 2 Score 0 0 0 0     Cognitive Function     6CIT Screen 09/10/2016  What Year? 0 points  What month? 0 points  What time? 0 points  Count back from 20 0 points  Months in reverse 0 points  Repeat phrase 0 points  Total Score 0    Immunization History  Administered Date(s) Administered  . Influenza Split 03/31/2012, 04/01/2013, 02/22/2014  . Influenza Whole 03/05/2011  . Influenza, High Dose Seasonal PF 02/20/2016  . Influenza,inj,Quad PF,36+ Mos 02/21/2015  .  Pneumococcal Conjugate-13 08/02/2013  . Pneumococcal Polysaccharide-23 04/09/1999, 04/09/2011  . Tdap 09/19/2008  . Zoster 01/20/2007   Screening Tests Health Maintenance  Topic Date Due  . INFLUENZA VACCINE  01/01/2017  . TETANUS/TDAP  09/20/2018  . DEXA SCAN  Completed  . PNA vac Low Risk Adult  Completed      Plan:    End of life planning; Advance aging; Advanced directives discussed. Copy of current HCPOA/Living Will requested.    Medicare Attestation I have personally reviewed: The patient's medical and social history Their use of alcohol, tobacco or illicit drugs Their current medications and supplements The patient's functional ability including ADLs,fall risks, home safety risks, cognitive, and hearing and visual impairment Diet and physical activities Evidence for depression   The patient's weight, height, BMI, and visual acuity have been recorded in the chart.  I have made referrals and provided education to the patient based on review of the above and I have provided the patient with a written personalized care plan for preventive services.    During the course of the visit the patient was educated and counseled about the following appropriate screening and preventive services:   Vaccines to include Pneumoccal, Influenza, Hepatitis B, Td, Zostavax,  HCV  Colorectal cancer screening-UTD  Bone density screening-UTD  Glaucoma screening-annual eye exams  Mammography-UTD  Nutrition counseling   Patient Instructions (the written plan) was given to the patient.   Varney Biles, LPN  01/18/7115

## 2016-09-10 NOTE — Patient Instructions (Addendum)
  Ana Cruz , Thank you for taking time to come for your Medicare Wellness Visit. I appreciate your ongoing commitment to your health goals. Please review the following plan we discussed and let me know if I can assist you in the future.   Follow up with Dr. Derrel Nip as needed.    Bring a copy of your [place] and/or Living Will to be scanned into chart.  Have a great day!   These are the goals we discussed: Goals    . Increase water intake          Stay hydrated       This is a list of the screening recommended for you and due dates:  Health Maintenance  Topic Date Due  . Flu Shot  01/01/2017  . Tetanus Vaccine  09/20/2018  . DEXA scan (bone density measurement)  Completed  . Pneumonia vaccines  Completed

## 2016-09-11 NOTE — Progress Notes (Signed)
  I have reviewed the above information and agree with above.   Teresa Tullo, MD 

## 2016-09-30 ENCOUNTER — Encounter: Payer: Self-pay | Admitting: Podiatry

## 2016-09-30 ENCOUNTER — Ambulatory Visit (INDEPENDENT_AMBULATORY_CARE_PROVIDER_SITE_OTHER): Payer: Medicare Other | Admitting: Podiatry

## 2016-09-30 DIAGNOSIS — M79676 Pain in unspecified toe(s): Secondary | ICD-10-CM | POA: Diagnosis not present

## 2016-09-30 DIAGNOSIS — B351 Tinea unguium: Secondary | ICD-10-CM

## 2016-09-30 NOTE — Progress Notes (Signed)
She presents today with chief complaint of painful elongated toenails.  Objective: Vital signs are stable she is alert and oriented 3. Pulses are palpable. Tenderness along thick yellow dystrophic with mycotic and painful palpation.  Assessment: Pain elicited onychomycosis.  Plan: Return as 1 through 5 bilateral.

## 2016-10-10 ENCOUNTER — Other Ambulatory Visit: Payer: Self-pay

## 2016-11-26 ENCOUNTER — Encounter: Payer: Self-pay | Admitting: Internal Medicine

## 2016-11-26 ENCOUNTER — Ambulatory Visit (INDEPENDENT_AMBULATORY_CARE_PROVIDER_SITE_OTHER): Payer: Medicare Other | Admitting: Internal Medicine

## 2016-11-26 VITALS — BP 154/72 | HR 54 | Temp 97.9°F | Resp 16 | Ht 63.5 in | Wt 177.2 lb

## 2016-11-26 DIAGNOSIS — M7989 Other specified soft tissue disorders: Secondary | ICD-10-CM | POA: Diagnosis not present

## 2016-11-26 DIAGNOSIS — Z1239 Encounter for other screening for malignant neoplasm of breast: Secondary | ICD-10-CM

## 2016-11-26 DIAGNOSIS — E034 Atrophy of thyroid (acquired): Secondary | ICD-10-CM | POA: Diagnosis not present

## 2016-11-26 DIAGNOSIS — Z79899 Other long term (current) drug therapy: Secondary | ICD-10-CM

## 2016-11-26 DIAGNOSIS — G8929 Other chronic pain: Secondary | ICD-10-CM

## 2016-11-26 DIAGNOSIS — D492 Neoplasm of unspecified behavior of bone, soft tissue, and skin: Secondary | ICD-10-CM

## 2016-11-26 DIAGNOSIS — M8580 Other specified disorders of bone density and structure, unspecified site: Secondary | ICD-10-CM

## 2016-11-26 DIAGNOSIS — I10 Essential (primary) hypertension: Secondary | ICD-10-CM | POA: Diagnosis not present

## 2016-11-26 DIAGNOSIS — M25511 Pain in right shoulder: Secondary | ICD-10-CM | POA: Diagnosis not present

## 2016-11-26 DIAGNOSIS — Z1231 Encounter for screening mammogram for malignant neoplasm of breast: Secondary | ICD-10-CM | POA: Diagnosis not present

## 2016-11-26 DIAGNOSIS — M25471 Effusion, right ankle: Secondary | ICD-10-CM | POA: Diagnosis not present

## 2016-11-26 LAB — TSH: TSH: 1.35 m[IU]/L

## 2016-11-26 MED ORDER — ALENDRONATE SODIUM 70 MG PO TABS: ORAL_TABLET | ORAL | 3 refills | Status: DC

## 2016-11-26 MED ORDER — LOSARTAN POTASSIUM 100 MG PO TABS: 100.0000 mg | ORAL_TABLET | Freq: Every day | ORAL | 1 refills | Status: DC

## 2016-11-26 MED ORDER — PRAVASTATIN SODIUM 20 MG PO TABS: 20.0000 mg | ORAL_TABLET | Freq: Every day | ORAL | 1 refills | Status: DC

## 2016-11-26 NOTE — Patient Instructions (Signed)
The ShingRx vaccine  Is available  and IS ADVISED for all interested adults over 50 to prevent shingles  (2 shot series) check with your pharmacy  Dermatology referral is I n process   Mammogram ordered.     A Venous ultrasound is needed of right leg to determine cause of swelling

## 2016-11-26 NOTE — Progress Notes (Signed)
Subjective:  Patient ID: Ana Cruz, female    DOB: 1934-02-25  Age: 83 y.o. MRN: [id number]  CC: The primary encounter diagnosis was Skin neoplasm. Diagnoses of Right leg swelling, Long-term use of high-risk medication, Breast cancer screening, Edema of right ankle, Chronic right shoulder pain, Osteopenia, unspecified location, Hypothyroidism due to acquired atrophy of thyroid, and Essential hypertension were also pertinent to this visit.  HPI [name] presents for follow up on multiple conditions.    Had a fall on April 18,while in [facility] .   Did not damage the new prosthetic left shoulder  But fractured several ribs  and bruised her knees and face    Has had less than optimal recovery of  ROM of left shoulder despite surgery  And PT   attributed to scar tissue.   May 10 and June 7 had 2 teeth extracted.  #15 on the left maxillary side .  The  roots had invaded the sinus cavity so she   has been taking amoxicillin 500 mg tid  Since early May.[name] He is taking a probiotic daily .  Has to rinse out mouth 4 times daily with salt water. [place] on [address].  She had an elevated glucose in March ,  But she was taking  Steroids at the time Started back exercising at the pool yesterday now that the PT has been completed .  Her Right ankle has been swollen for the last several months.  She notes that it is  fine in the morning but begins swelling by mid day.  She has had bilateral knee replacement in 2009.  She denies calf cramping and  pain .    NEEDS MAMMOGRAM   Outpatient Medications Prior to Visit  Medication Sig Dispense Refill  . acetaminophen (TYLENOL) 500 MG tablet Take 500 mg by mouth every 6 (six) hours as needed (As needed for pain).    [name] amiodarone (PACERONE) 200 MG tablet Take by mouth.    [name] amoxicillin (AMOXIL) 500 MG capsule Take 500 mg by mouth. Take four tablets prior to dental work.    . Artificial Tear Ointment (EYE LUBRICANT OP) Apply 1 drop to eye 2 (two)  times daily.    . Cholecalciferol (VITAMIN D3) 1000 UNITS CAPS Take by mouth.      . diltiazem (CARDIZEM) 30 MG tablet Take 1 tablet (30 mg total) by mouth daily as needed. (Patient taking differently: Take 30 mg by mouth daily as needed. ) 30 tablet 4  . levothyroxine (SYNTHROID, LEVOTHROID) 88 MCG tablet Take 1 tablet (88 mcg total) by mouth daily before breakfast. 90 tablet 1  . warfarin (COUMADIN) 2.5 MG tablet Take by mouth.    [name] alendronate (FOSAMAX) 70 MG tablet TAKE 1 TABLET  EVERY 7  DAYS  WITH A FULL GLASS OF WATER ON AN EMPTY STOMACH 12 tablet 3  . losartan (COZAAR) 100 MG tablet Take 1 tablet (100 mg total) by mouth daily. 90 tablet 1  . pravastatin (PRAVACHOL) 20 MG tablet Take 1 tablet (20 mg total) by mouth daily. 90 tablet 1  . chlorzoxazone (PARAFON) 500 MG tablet      No facility-administered medications prior to visit.     Review of Systems;  Patient denies headache, fevers, malaise, unintentional weight loss, skin rash, eye pain, sinus congestion and sinus pain, sore throat, dysphagia,  hemoptysis , cough, dyspnea, wheezing, chest pain, palpitations, orthopnea, edema, abdominal pain, nausea, melena, diarrhea, constipation, flank pain, dysuria, hematuria, urinary  Frequency,  nocturia, numbness, tingling, seizures,  Focal weakness, Loss of consciousness,  Tremor, insomnia, depression, anxiety, and suicidal ideation.      Objective:  BP (!) 154/72 (BP Location: Left Arm, Patient Position: Sitting, Cuff Size: Normal)   Pulse (!) 54   Temp 97.9 F (36.6 C) (Oral)   Resp 16   Ht 5' 3.5" (1.613 m)   Wt 177 lb 3.2 oz (80.4 kg)   SpO2 98%   BMI 30.90 kg/m   BP Readings from Last 3 Encounters:  11/26/16 (!) 154/72  09/10/16 128/62  08/20/16 (!) 144/62    Wt Readings from Last 3 Encounters:  11/26/16 177 lb 3.2 oz (80.4 kg)  09/10/16 177 lb 12.8 oz (80.6 kg)  08/20/16 176 lb 12.8 oz (80.2 kg)    General appearance: alert, cooperative and appears stated age Ears:  normal TM's and external ear canals both ears Throat: lips, mucosa, and tongue normal; teeth and gums normal Neck: no adenopathy, no carotid bruit, supple, symmetrical, trachea midline and thyroid not enlarged, symmetric, no tenderness/mass/nodules Back: symmetric, no curvature. ROM normal. No CVA tenderness. Lungs: clear to auscultation bilaterally Heart: regular rate and rhythm, S1, S2 normal, no murmur, click, rub or gallop Abdomen: soft, non-tender; bowel sounds normal; no masses,  no organomegaly Pulses: 2+ and symmetric Skin: Skin color, texture, turgor normal. No rashes or lesions Ext: right ankle with nonpitting edema. Lymph nodes: Cervical, supraclavicular, and axillary nodes normal.  Lab Results  Component Value Date   HGBA1C 5.9 08/20/2016   HGBA1C 6.1 02/20/2016    Lab Results  Component Value Date   CREATININE 1.16 08/20/2016   CREATININE 1.22 (H) 02/20/2016   CREATININE 1.10 08/21/2015    Lab Results  Component Value Date   WBC 7.8 08/21/2015   HGB 11.6 (L) 08/21/2015   HCT 35.4 (L) 08/21/2015   PLT 290.0 08/21/2015   GLUCOSE 198 (H) 08/20/2016   CHOL 198 08/21/2015   TRIG 81.0 08/21/2015   HDL 71.90 08/21/2015   LDLDIRECT 123.0 08/20/2016   LDLCALC 110 (H) 08/21/2015   ALT 17 08/20/2016   AST 21 08/20/2016   NA 139 08/20/2016   K 4.3 08/20/2016   CL 105 08/20/2016   CREATININE 1.16 08/20/2016   BUN 40 (H) 08/20/2016   CO2 25 08/20/2016   TSH 1.35 11/26/2016   HGBA1C 5.9 08/20/2016    Assessment & Plan:   Problem List Items Addressed This Visit    Osteopenia    She has been taking alendronate since 2013 with improvement in T scores by 2017 DEXA . However she has had a prolonged/delayed  healing process afger oral surgery and alendronate is suspected to be the cause.  Will discuss changing to prolia  After taking a one year  drug holiday      Hypothyroidism    Thyroid function is WNL on current dose.  No current changes needed.        Hypertension    Elevated today due to current use of prednisone for shoulder  By [place]. Patient advised to check BP at home.   Lab Results  Component Value Date   CREATININE 1.16 08/20/2016   Lab Results  Component Value Date   NA 139 08/20/2016   K 4.3 08/20/2016   CL 105 08/20/2016   CO2 25 08/20/2016         Relevant Medications   losartan (COZAAR) 100 MG tablet   pravastatin (PRAVACHOL) 20 MG tablet   Edema of right ankle  Venous insufficiency suspected,  Ultrasound ordered.  Compression stockings advised.       Chronic right shoulder pain    Other Visit Diagnoses    Skin neoplasm    -  Primary   Relevant Orders   Ambulatory referral to Dermatology   Right leg swelling       Relevant Orders   Ambulatory referral to Vascular Surgery   Long-term use of high-risk medication       Relevant Orders   T4 (Completed)   TSH (Completed)   Breast cancer screening       Relevant Orders   MM Digital Screening     A total of 25 minutes of face to face time was spent with patient more than half of which was spent in counselling about the above mentioned conditions  and coordination of care   I have discontinued Ms. Rufo's chlorzoxazone. I am also having her maintain her Vitamin D3, diltiazem, amoxicillin, levothyroxine, warfarin, amiodarone, acetaminophen, Artificial Tear Ointment (EYE LUBRICANT OP), Probiotic Product (PROBIOTIC ADVANCED PO), alendronate, losartan, and pravastatin.  Meds ordered this encounter  Medications  . Probiotic Product (PROBIOTIC ADVANCED PO)    Sig: Take 1 tablet by mouth daily.  [name] alendronate (FOSAMAX) 70 MG tablet    Sig: TAKE 1 TABLET  EVERY 7  DAYS  WITH A FULL GLASS OF WATER ON AN EMPTY STOMACH    Dispense:  12 tablet    Refill:  3  . losartan (COZAAR) 100 MG tablet    Sig: Take 1 tablet (100 mg total) by mouth daily.    Dispense:  90 tablet    Refill:  1  . pravastatin (PRAVACHOL) 20 MG tablet    Sig: Take 1 tablet (20 mg total)  by mouth daily.    Dispense:  90 tablet    Refill:  1    Medications Discontinued During This Encounter  Medication Reason  . chlorzoxazone (PARAFON) 500 MG tablet Patient has not taken in last 30 days  . alendronate (FOSAMAX) 70 MG tablet Reorder  . losartan (COZAAR) 100 MG tablet Reorder  . pravastatin (PRAVACHOL) 20 MG tablet Reorder    Follow-up: No Follow-up on file.   Crecencio Mc, MD

## 2016-11-27 DIAGNOSIS — M25471 Effusion, right ankle: Secondary | ICD-10-CM | POA: Insufficient documentation

## 2016-11-27 LAB — T4: T4, Total: 10 ug/dL (ref 4.5–12.0)

## 2016-11-27 NOTE — Assessment & Plan Note (Signed)
Venous insufficiency suspected,  Ultrasound ordered.  Compression stockings advised.

## 2016-11-27 NOTE — Assessment & Plan Note (Signed)
Elevated today due to current use of prednisone for shoulder  By [place]. Patient advised to check BP at home.   Lab Results  Component Value Date   CREATININE 1.16 08/20/2016   Lab Results  Component Value Date   NA 139 08/20/2016   K 4.3 08/20/2016   CL 105 08/20/2016   CO2 25 08/20/2016

## 2016-11-27 NOTE — Assessment & Plan Note (Addendum)
She has been taking alendronate since 2013 with improvement in T scores by 2017 DEXA . However she has had a prolonged/delayed  healing process afger oral surgery and alendronate is suspected to be the cause.  Will discuss changing to prolia  After taking a one year  drug holiday

## 2016-11-27 NOTE — Assessment & Plan Note (Signed)
Thyroid function is WNL on current dose.  No current changes needed.  

## 2016-12-16 ENCOUNTER — Ambulatory Visit (INDEPENDENT_AMBULATORY_CARE_PROVIDER_SITE_OTHER): Payer: Medicare Other | Admitting: Vascular Surgery

## 2016-12-16 ENCOUNTER — Encounter (INDEPENDENT_AMBULATORY_CARE_PROVIDER_SITE_OTHER): Payer: Self-pay | Admitting: Vascular Surgery

## 2016-12-16 VITALS — BP 174/61 | HR 57 | Ht 64.0 in | Wt 176.0 lb

## 2016-12-16 DIAGNOSIS — I890 Lymphedema, not elsewhere classified: Secondary | ICD-10-CM

## 2016-12-16 DIAGNOSIS — I872 Venous insufficiency (chronic) (peripheral): Secondary | ICD-10-CM | POA: Diagnosis not present

## 2016-12-16 DIAGNOSIS — I471 Supraventricular tachycardia: Secondary | ICD-10-CM

## 2016-12-16 DIAGNOSIS — I10 Essential (primary) hypertension: Secondary | ICD-10-CM

## 2016-12-16 NOTE — Progress Notes (Signed)
MRN : [id number]  Ana Cruz is a 83 y.o. ([birth year]) female who presents with chief complaint of  Chief Complaint  Patient presents with  . Establish Care  .  History of Present Illness: Patient is seen for evaluation of right leg swelling. The patient first noticed the swelling several months ago but is now concerned because of a significant increase in the overall edema. The swelling is not associated with pain and discoloration. The patient notes that in the morning the legs are significantly improved but they steadily worsened throughout the course of the day. Elevation makes the legs better, dependency makes them much worse.   There is no history of ulcerations associated with the swelling.   The patient denies any recent changes in their medications.  The patient has not been wearing graduated compression.  The patient has no had any past angiography, interventions or vascular surgery.  The patient denies a history of DVT or PE. There is no prior history of phlebitis. There is no history of primary lymphedema.  There is no history of radiation treatment to the groin or pelvis No history of malignancies. No history of trauma or groin or pelvic surgery. No history of foreign travel or parasitic infections area    Current Meds  Medication Sig  . acetaminophen (TYLENOL) 500 MG tablet Take 500 mg by mouth every 6 (six) hours as needed (As needed for pain).  [name] alendronate (FOSAMAX) 70 MG tablet TAKE 1 TABLET  EVERY 7  DAYS  WITH A FULL GLASS OF WATER ON AN EMPTY STOMACH  . amiodarone (PACERONE) 200 MG tablet Take by mouth.  [name] amoxicillin (AMOXIL) 500 MG capsule Take 500 mg by mouth. Take four tablets prior to dental work.  . Artificial Tear Ointment (EYE LUBRICANT OP) Apply 1 drop to eye 2 (two) times daily.  . Cholecalciferol (VITAMIN D3) 1000 UNITS CAPS Take by mouth.    . diltiazem (CARDIZEM) 30 MG tablet Take 1 tablet (30 mg total) by mouth daily as needed.  (Patient taking differently: Take 30 mg by mouth daily as needed. )  . levothyroxine (SYNTHROID, LEVOTHROID) 88 MCG tablet Take 1 tablet (88 mcg total) by mouth daily before breakfast.  . losartan (COZAAR) 100 MG tablet Take 1 tablet (100 mg total) by mouth daily.  . pravastatin (PRAVACHOL) 20 MG tablet Take 1 tablet (20 mg total) by mouth daily.  . Probiotic Product (PROBIOTIC ADVANCED PO) Take 1 tablet by mouth daily.  [name] warfarin (COUMADIN) 2.5 MG tablet Take by mouth.    Past Medical History:  Diagnosis Date  . allergic rhinitis   . Atrial fibrillation ([place])   . Chronic kidney disease, stage I    mild, did improve with cessation of NSAIDs  . Elbow fracture, left    repaired Jan 2000  . Hyperlipidemia   . Hypertension   . hypothyroidism    medicated since age 63, no prior surgery  . 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 ABDOMINAL HYSTERECTOMY  1979  . APPENDECTOMY  1969  . BILATERAL OOPHORECTOMY     squ[place]  . BLADDER REPAIR  1980  . OVARIAN CYST REMOVAL  April 1969  . REPLACEMENT TOTAL KNEE BILATERAL  2009  . reverse shoulder surgery  04/05/2016  . ROTATOR CUFF REPAIR  Jan 2000  . ROTATOR CUFF REPAIR  2000    Social History Social History  Substance Use Topics  . Smoking status: Never Smoker  . Smokeless tobacco: Never Used  . Alcohol use Yes    Family History Family History  Problem Relation Age of Onset  . Hypertension Mother   . Dementia Mother   . Heart disease Father 65       MI  . Cancer Paternal Grandfather 56       colon  . Breast cancer Neg Hx   No family history of bleeding/clotting disorders, porphyria or autoimmune disease   Allergies  Allergen Reactions  . Chocolate Flavor Hives  . Clindamycin/Lincomycin     Tongue swelled,  Throat felt funny  . Onion Swelling    Throat swelling  . Other Hives    Raw Onions, Spring Time causes eye itching and  runny nose. *Pt. Also Had Large Blisters from Tourniquets applied to her legs during her knee replacement surgery*  . Tape      REVIEW OF SYSTEMS (Negative unless checked)  Constitutional: [] Weight loss  [] Fever  [] Chills Cardiac: [] Chest pain   [] Chest pressure   [] Palpitations   [] Shortness of breath when laying flat   [] Shortness of breath with exertion. Vascular:  [] Pain in legs with walking   [] Pain in legs at rest  [] History of DVT   [] Phlebitis   [x] Swelling in legs   [] Varicose veins   [] Non-healing ulcers Pulmonary:   [] Uses home oxygen   [] Productive cough   [] Hemoptysis   [] Wheeze  [] COPD   [] Asthma Neurologic:  [] Dizziness   [] Seizures   [] History of stroke   [] History of TIA  [] Aphasia   [] Vissual changes   [] Weakness or numbness in arm   [] Weakness or numbness in leg Musculoskeletal:   [] Joint swelling   [] Joint pain   [] Low back pain Hematologic:  [] Easy bruising  [] Easy bleeding   [] Hypercoagulable state   [] Anemic Gastrointestinal:  [] Diarrhea   [] Vomiting  [] Gastroesophageal reflux/heartburn   [] Difficulty swallowing. Genitourinary:  [x] Chronic kidney disease   [] Difficult urination  [] Frequent urination   [] Blood in urine Skin:  [] Rashes   [] Ulcers  Psychological:  [] History of anxiety   []  History of major depression.  Physical Examination  Vitals:   12/16/16 0836  BP: (!) 174/61  Pulse: (!) 57  Weight: 176 lb (79.8 kg)  Height: 5\' 4"  (1.626 m)   Body mass index is 30.21 kg/m. Gen: WD/WN, NAD Head: [place]/AT, No temporalis wasting.  Ear/Nose/Throat: Hearing grossly intact, nares w/o erythema or drainage, poor dentition Eyes: PER, EOMI, sclera nonicteric.  Neck: Supple, no masses.  No bruit or JVD.  Pulmonary:  Good air movement, clear to auscultation bilaterally, no use of accessory muscles.  Cardiac: RRR, normal S1, S2, no Murmurs. Vascular: 2+ edema right ankle soft, trace edema left ankle no significant venous changes; scattered varicose veins  bilaterally Vessel Right Left  Radial Palpable Palpable  PT Palpable Palpable  DP Palpable Palpable  Gastrointestinal: soft, non-distended. No guarding/no peritoneal signs.  Musculoskeletal: M/S 5/5 throughout.  No deformity or atrophy.  Neurologic: CN 2-12 intact. Pain and light touch intact in extremities.  Symmetrical.  Speech is fluent. Motor exam as listed above. Psychiatric: Judgment intact, Mood & affect appropriate for pt's clinical situation. Dermatologic: No rashes or ulcers noted.  No changes consistent with cellulitis. Lymph : No Cervical lymphadenopathy, no lichenification or skin changes of chronic lymphedema.  CBC Lab Results  Component Value Date   WBC 7.8 08/21/2015   HGB 11.6 (L) 08/21/2015   HCT 35.4 (L) 08/21/2015  MCV 93.4 08/21/2015   PLT 290.0 08/21/2015    BMET    Component Value Date/Time   NA 139 08/20/2016 1423   NA 137 06/17/2014 1454   K 4.3 08/20/2016 1423   K 4.1 06/17/2014 1454   CL 105 08/20/2016 1423   CL 105 06/17/2014 1454   CO2 25 08/20/2016 1423   CO2 23 06/17/2014 1454   GLUCOSE 198 (H) 08/20/2016 1423   GLUCOSE 131 (H) 06/17/2014 1454   BUN 40 (H) 08/20/2016 1423   BUN 31 (H) 06/17/2014 1454   CREATININE 1.16 08/20/2016 1423   CREATININE 1.37 (H) 06/17/2014 1454   CALCIUM 9.5 08/20/2016 1423   CALCIUM 9.0 06/17/2014 1454   GFRNONAA 40 (L) 06/17/2014 1454   GFRAA 48 (L) 06/17/2014 1454   CrCl cannot be calculated (Patient's most recent lab result is older than the maximum 21 days allowed.).  COAG No results found for: INR, PROTIME  Radiology No results found.   Assessment/Plan 1. Lymphedema I have had a long discussion with the patient regarding swelling and why it  causes symptoms.  Patient will begin wearing graduated compression stockings on a daily basis a prescription was given. The patient will  beginning wearing the stockings first thing in the morning and removing them in the evening. The patient is instructed  specifically not to sleep in the stockings.   In addition, behavioral modification will be initiated.  This will include frequent elevation, use of over the counter pain medications and exercise such as walking.  I have reviewed systemic causes for chronic edema such as liver, kidney and cardiac etiologies.  The patient denies problems with these organ systems.    Consideration for a lymph pump will also be made based upon the effectiveness of conservative therapy.  This would help to improve the edema control and prevent sequela such as ulcers and infections   Patient should undergo duplex ultrasound of the venous system to ensure that DVT or reflux is not present.  The patient will follow-up with me after the ultrasound.   - VAS [place] LOWER EXTREMITY VENOUS (DVT); Future  2. Chronic venous insufficiency No surgery or intervention at this point in time.    I have had a long discussion with the patient regarding venous insufficiency and why it  causes symptoms. I have discussed with the patient the chronic skin changes that accompany venous insufficiency and the long term sequela such as infection and ulceration.  Patient will begin wearing graduated compression stockings class 1 (20-30 mmHg) or compression wraps on a daily basis a prescription was given. The patient will put the stockings on first thing in the morning and removing them in the evening. The patient is instructed specifically not to sleep in the stockings.    In addition, behavioral modification including several periods of elevation of the lower extremities during the day will be continued. I have demonstrated that proper elevation is a position with the ankles at heart level.  The patient is instructed to begin routine exercise, especially walking on a daily basis  Patient should undergo duplex ultrasound of the venous system to ensure that DVT or reflux is not present.  - VAS [place] LOWER EXTREMITY VENOUS (DVT); Future  3.  Essential hypertension Continue antihypertensive medications as already ordered, these medications have been reviewed and there are no changes at this time.   4. SVT (supraventricular tachycardia) (HCC) Continue antiarrhythmia medications as already ordered, these medications have been reviewed and there are no changes  at this time.  Continue anticoagulation as ordered by Cardiology Service     Hortencia Pilar, MD  12/16/2016 9:22 AM

## 2016-12-30 ENCOUNTER — Encounter: Payer: Self-pay | Admitting: Podiatry

## 2016-12-30 ENCOUNTER — Ambulatory Visit: Payer: Medicare Other | Admitting: Podiatry

## 2016-12-30 ENCOUNTER — Ambulatory Visit (INDEPENDENT_AMBULATORY_CARE_PROVIDER_SITE_OTHER): Payer: Self-pay | Admitting: Podiatry

## 2016-12-30 DIAGNOSIS — M79676 Pain in unspecified toe(s): Secondary | ICD-10-CM | POA: Diagnosis not present

## 2016-12-30 DIAGNOSIS — B351 Tinea unguium: Secondary | ICD-10-CM

## 2016-12-30 NOTE — Progress Notes (Signed)
She presents today with chief complaint of painful elongated toenails are painful and sharply incurvated.  Objective: Pulses are palpable bilateral. No open lesions or wounds are noted. Toenails are long and thick yellow dystrophic with mycotic sharply incurvated and painful on palpation as well as debridement.  Assessment: Pain in limb secondary to onychomycosis and ingrown nails.  Plan: Debridement of toenails 1 through 5 bilateral is covered service secondary to pain.

## 2017-01-20 ENCOUNTER — Encounter (INDEPENDENT_AMBULATORY_CARE_PROVIDER_SITE_OTHER): Payer: Self-pay | Admitting: Vascular Surgery

## 2017-01-20 ENCOUNTER — Ambulatory Visit (INDEPENDENT_AMBULATORY_CARE_PROVIDER_SITE_OTHER): Payer: Medicare Other | Admitting: Vascular Surgery

## 2017-01-20 ENCOUNTER — Ambulatory Visit (INDEPENDENT_AMBULATORY_CARE_PROVIDER_SITE_OTHER): Payer: Medicare Other

## 2017-01-20 VITALS — BP 118/70 | HR 64 | Resp 16 | Wt 177.9 lb

## 2017-01-20 DIAGNOSIS — I890 Lymphedema, not elsewhere classified: Secondary | ICD-10-CM

## 2017-01-20 DIAGNOSIS — E782 Mixed hyperlipidemia: Secondary | ICD-10-CM | POA: Diagnosis not present

## 2017-01-20 DIAGNOSIS — I10 Essential (primary) hypertension: Secondary | ICD-10-CM | POA: Diagnosis not present

## 2017-01-20 DIAGNOSIS — I872 Venous insufficiency (chronic) (peripheral): Secondary | ICD-10-CM

## 2017-01-24 NOTE — Progress Notes (Signed)
MRN : [id number]  Ana Cruz is a 83 y.o. (12/28/1933) female who presents with chief complaint of  Chief Complaint  Patient presents with  . ultrasound follow up  .  History of Present Illness:The patient returns to the office for followup evaluation regarding leg swelling.  The swelling has persisted and the pain associated with swelling continues. There have not been any interval development of a ulcerations or wounds.  Since the previous visit the patient has been wearing graduated compression stockings and has noted little if any improvement in the lymphedema. The patient has been using compression routinely morning until night.  The patient also states elevation during the day and exercise is being done too.   Current Meds  Medication Sig  . acetaminophen (TYLENOL) 500 MG tablet Take 500 mg by mouth every 6 (six) hours as needed (As needed for pain).  [name] alendronate (FOSAMAX) 70 MG tablet TAKE 1 TABLET  EVERY 7  DAYS  WITH A FULL GLASS OF WATER ON AN EMPTY STOMACH  . amiodarone (PACERONE) 200 MG tablet Take by mouth.  [name] amoxicillin (AMOXIL) 500 MG capsule Take 500 mg by mouth. Take four tablets prior to dental work.  . Artificial Tear Ointment (EYE LUBRICANT OP) Apply 1 drop to eye 2 (two) times daily.  . Cholecalciferol (VITAMIN D3) 1000 UNITS CAPS Take by mouth.    . diltiazem (CARDIZEM) 30 MG tablet Take 1 tablet (30 mg total) by mouth daily as needed. (Patient taking differently: Take 30 mg by mouth daily as needed. )  . levothyroxine (SYNTHROID, LEVOTHROID) 88 MCG tablet Take 1 tablet (88 mcg total) by mouth daily before breakfast.  . losartan (COZAAR) 100 MG tablet Take 1 tablet (100 mg total) by mouth daily.  . pravastatin (PRAVACHOL) 20 MG tablet Take 1 tablet (20 mg total) by mouth daily.  [name] warfarin (COUMADIN) 2.5 MG tablet Take by mouth.    Past Medical History:  Diagnosis Date  . allergic rhinitis   . Atrial fibrillation ([place])   . Chronic kidney  disease, stage I    mild, did improve with cessation of NSAIDs  . Elbow fracture, left    repaired Jan 2000  . Hyperlipidemia   . Hypertension   . hypothyroidism    medicated since age 53, no prior surgery  . 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 ABDOMINAL HYSTERECTOMY  1979  . APPENDECTOMY  1969  . BILATERAL OOPHORECTOMY     squential  . BLADDER REPAIR  1980  . OVARIAN CYST REMOVAL  April 1969  . REPLACEMENT TOTAL KNEE BILATERAL  2009  . reverse shoulder surgery  04/05/2016  . ROTATOR CUFF REPAIR  Jan 2000  . ROTATOR CUFF REPAIR  2000    Social History Social History  Substance Use Topics  . Smoking status: Never Smoker  . Smokeless tobacco: Never Used  . Alcohol use Yes    Family History Family History  Problem Relation Age of Onset  . Hypertension Mother   . Dementia Mother   . Heart disease Father 68       MI  . Cancer Paternal Grandfather 46       colon  . Breast cancer Neg Hx     Allergies  Allergen Reactions  . Chocolate Flavor Hives  . Clindamycin/Lincomycin     Tongue swelled,  Throat felt funny  . Onion Swelling  Throat swelling  . Other Hives    Raw Onions, Spring Time causes eye itching and runny nose. *Pt. Also Had Large Blisters from Tourniquets applied to her legs during her knee replacement surgery*  . Tape      REVIEW OF SYSTEMS (Negative unless checked)  Constitutional: [] Weight loss  [] Fever  [] Chills Cardiac: [] Chest pain   [] Chest pressure   [] Palpitations   [] Shortness of breath when laying flat   [] Shortness of breath with exertion. Vascular:  [] Pain in legs with walking   [x] Pain in legs with standing [] History of DVT   [] Phlebitis   [x] Swelling in legs   [] Varicose veins   [] Non-healing ulcers Pulmonary:   [] Uses home oxygen   [] Productive cough   [] Hemoptysis   [] Wheeze  [] COPD   [] Asthma Neurologic:  [] Dizziness   [] Seizures    [] History of stroke   [] History of TIA  [] Aphasia   [] Vissual changes   [] Weakness or numbness in arm   [] Weakness or numbness in leg Musculoskeletal:   [] Joint swelling   [] Joint pain   [] Low back pain Hematologic:  [] Easy bruising  [] Easy bleeding   [] Hypercoagulable state   [] Anemic Gastrointestinal:  [] Diarrhea   [] Vomiting  [] Gastroesophageal reflux/heartburn   [] Difficulty swallowing. Genitourinary:  [] Chronic kidney disease   [] Difficult urination  [] Frequent urination   [] Blood in urine Skin:  [] Rashes   [] Ulcers  Psychological:  [] History of anxiety   []  History of major depression.  Physical Examination  Vitals:   01/20/17 1607  BP: 118/70  Pulse: 64  Resp: 16  Weight: 80.7 kg (177 lb 13.9 oz)   Body mass index is 30.53 kg/m. Gen: WD/WN, NAD Head: [place]/AT, No temporalis wasting.  Ear/Nose/Throat: Hearing grossly intact, nares w/o erythema or drainage Eyes: PER, EOMI, sclera nonicteric.  Neck: Supple, no large masses.   Pulmonary:  Good air movement, no audible wheezing bilaterally, no use of accessory muscles.  Cardiac: RRR, no JVD Vascular:   Moderate venous stasis changes to the legs bilaterally.  4+ soft pitting edema Vessel Right Left  Radial Palpable Palpable  PT Palpable Palpable  DP Palpable Palpable  Gastrointestinal: Non-distended. No guarding/no peritoneal signs.  Musculoskeletal: M/S 5/5 throughout.  No deformity or atrophy.  Neurologic: CN 2-12 intact. Symmetrical.  Speech is fluent. Motor exam as listed above. Psychiatric: Judgment intact, Mood & affect appropriate for pt's clinical situation. Dermatologic: Venous rashes no ulcers noted.  No changes consistent with cellulitis. Lymph : No lichenification or skin changes of chronic lymphedema.  CBC Lab Results  Component Value Date   WBC 7.8 08/21/2015   HGB 11.6 (L) 08/21/2015   HCT 35.4 (L) 08/21/2015   MCV 93.4 08/21/2015   PLT 290.0 08/21/2015    BMET    Component Value Date/Time   NA 139  08/20/2016 1423   NA 137 06/17/2014 1454   K 4.3 08/20/2016 1423   K 4.1 06/17/2014 1454   CL 105 08/20/2016 1423   CL 105 06/17/2014 1454   CO2 25 08/20/2016 1423   CO2 23 06/17/2014 1454   GLUCOSE 198 (H) 08/20/2016 1423   GLUCOSE 131 (H) 06/17/2014 1454   BUN 40 (H) 08/20/2016 1423   BUN 31 (H) 06/17/2014 1454   CREATININE 1.16 08/20/2016 1423   CREATININE 1.37 (H) 06/17/2014 1454   CALCIUM 9.5 08/20/2016 1423   CALCIUM 9.0 06/17/2014 1454   GFRNONAA 40 (L) 06/17/2014 1454   GFRAA 48 (L) 06/17/2014 1454   CrCl cannot be calculated (Patient's most recent  lab result is older than the maximum 21 days allowed.).  COAG No results found for: INR, PROTIME  Radiology No results found.   Assessment/Plan 1. Chronic venous insufficiency No surgery or intervention at this point in time.    I have had a long discussion with the patient regarding venous insufficiency and why it  causes symptoms. I have discussed with the patient the chronic skin changes that accompany venous insufficiency and the long term sequela such as infection and ulceration.  Patient will begin wearing graduated compression stockings class 1 (20-30 mmHg) or compression wraps on a daily basis a prescription was given. The patient will put the stockings on first thing in the morning and removing them in the evening. The patient is instructed specifically not to sleep in the stockings.    In addition, behavioral modification including several periods of elevation of the lower extremities during the day will be continued. I have demonstrated that proper elevation is a position with the ankles at heart level.  The patient is instructed to begin routine exercise, especially walking on a daily basis  Following the review of the ultrasound the patient will follow up in 2-3 months to reassess the degree of swelling and the control that graduated compression stockings or compression wraps  is offering.   The patient can be  assessed for a Lymph Pump at that time  2. Lymphedema No surgery or intervention at this point in time.    I have had a long discussion with the patient regarding venous insufficiency and why it  causes symptoms. I have discussed with the patient the chronic skin changes that accompany venous insufficiency and the long term sequela such as infection and ulceration.  Patient will begin wearing graduated compression stockings class 1 (20-30 mmHg) or compression wraps on a daily basis a prescription was given. The patient will put the stockings on first thing in the morning and removing them in the evening. The patient is instructed specifically not to sleep in the stockings.    In addition, behavioral modification including several periods of elevation of the lower extremities during the day will be continued. I have demonstrated that proper elevation is a position with the ankles at heart level.  The patient is instructed to begin routine exercise, especially walking on a daily basis  Following the review of the ultrasound the patient will follow up in 2-3 months to reassess the degree of swelling and the control that graduated compression stockings or compression wraps  is offering.   The patient can be assessed for a Lymph Pump at that time  3. Mixed hyperlipidemia Continue statin as ordered and reviewed, no changes at this time   4. Essential hypertension Continue antihypertensive medications as already ordered, these medications have been reviewed and there are no changes at this time.     Hortencia Pilar, MD  01/24/2017 4:29 PM

## 2017-01-27 ENCOUNTER — Ambulatory Visit
Admission: RE | Admit: 2017-01-27 | Discharge: 2017-01-27 | Disposition: A | Discharge status: Home / Self Care | Payer: Medicare Other | Source: Ambulatory Visit | Attending: Internal Medicine | Admitting: Internal Medicine

## 2017-01-27 DIAGNOSIS — Z1239 Encounter for other screening for malignant neoplasm of breast: Secondary | ICD-10-CM

## 2017-01-27 DIAGNOSIS — Z1231 Encounter for screening mammogram for malignant neoplasm of breast: Secondary | ICD-10-CM | POA: Insufficient documentation

## 2017-01-28 ENCOUNTER — Encounter: Payer: Self-pay | Admitting: Internal Medicine

## 2017-03-14 ENCOUNTER — Encounter: Payer: Self-pay | Admitting: Internal Medicine

## 2017-03-14 ENCOUNTER — Ambulatory Visit (INDEPENDENT_AMBULATORY_CARE_PROVIDER_SITE_OTHER): Payer: Medicare Other | Admitting: Internal Medicine

## 2017-03-14 VITALS — BP 170/70 | HR 57 | Temp 98.3°F | Resp 15 | Ht 64.0 in | Wt 175.6 lb

## 2017-03-14 DIAGNOSIS — I890 Lymphedema, not elsewhere classified: Secondary | ICD-10-CM

## 2017-03-14 DIAGNOSIS — I10 Essential (primary) hypertension: Secondary | ICD-10-CM

## 2017-03-14 DIAGNOSIS — N181 Chronic kidney disease, stage 1: Secondary | ICD-10-CM | POA: Diagnosis not present

## 2017-03-14 DIAGNOSIS — Z23 Encounter for immunization: Secondary | ICD-10-CM

## 2017-03-14 DIAGNOSIS — M25511 Pain in right shoulder: Secondary | ICD-10-CM | POA: Diagnosis not present

## 2017-03-14 DIAGNOSIS — E782 Mixed hyperlipidemia: Secondary | ICD-10-CM | POA: Diagnosis not present

## 2017-03-14 DIAGNOSIS — G8929 Other chronic pain: Secondary | ICD-10-CM

## 2017-03-14 DIAGNOSIS — E034 Atrophy of thyroid (acquired): Secondary | ICD-10-CM | POA: Diagnosis not present

## 2017-03-14 DIAGNOSIS — Z79899 Other long term (current) drug therapy: Secondary | ICD-10-CM

## 2017-03-14 LAB — HEPATIC FUNCTION PANEL
ALT: 13 U/L (ref 0–35)
AST: 17 U/L (ref 0–37)
Albumin: 4 g/dL (ref 3.5–5.2)
Alkaline Phosphatase: 85 U/L (ref 39–117)
BILIRUBIN DIRECT: 0.1 mg/dL (ref 0.0–0.3)
BILIRUBIN TOTAL: 0.3 mg/dL (ref 0.2–1.2)
TOTAL PROTEIN: 7 g/dL (ref 6.0–8.3)

## 2017-03-14 LAB — BASIC METABOLIC PANEL
BUN: 33 mg/dL — ABNORMAL HIGH (ref 6–23)
CALCIUM: 8.9 mg/dL (ref 8.4–10.5)
CO2: 31 meq/L (ref 19–32)
CREATININE: 1.26 mg/dL — AB (ref 0.40–1.20)
Chloride: 104 mEq/L (ref 96–112)
GFR: 43.25 mL/min — AB (ref >60.00)
GLUCOSE: 148 mg/dL — AB (ref 70–99)
Potassium: 4.4 mEq/L (ref 3.5–5.1)
SODIUM: 140 meq/L (ref 135–145)

## 2017-03-14 LAB — TSH: TSH: 1.5 u[IU]/mL (ref 0.35–4.50)

## 2017-03-14 LAB — LDL CHOLESTEROL, DIRECT: LDL DIRECT: 107 mg/dL

## 2017-03-14 MED ORDER — HYDROCHLOROTHIAZIDE 25 MG PO TABS: 25.0000 mg | ORAL_TABLET | Freq: Every day | ORAL | 0 refills | Status: DC

## 2017-03-14 NOTE — Progress Notes (Signed)
Subjective:  Patient ID: Ana Cruz, female    DOB: 08-Jan-1934  Age: 83 y.o. MRN: [id number]  CC: The primary encounter diagnosis was Encounter for immunization. Diagnoses of Essential hypertension, Mixed hyperlipidemia, Hypothyroidism due to acquired atrophy of thyroid, Long-term use of high-risk medication, Lymphedema, Chronic right shoulder pain, and Chronic kidney disease, stage I were also pertinent to this visit.  HPI Ana Cruz presents for elevated blood pressure  Hypertension: patient checks blood pressure twice weekly at home.  Readings have been for the most part > 140/80 at rest . Patient is following a reduced salt diet most days and is taking medications as prescribed  Home readings ave been elevated since early September   She is no longer taking cardizem since her cardiologist started amiodarone.  Not ttaking any NSAIDs or prednisone  lymphedema diagnosed recently    by vascular .  Wearing stockings,  lympedema pump to be delivered next week   Denies orthopnea,  Chest pain or dyspnea.  Still having limited mobiity fo right shoulder post surgery due to scar formation.     Lab Results  Component Value Date   CREATININE 1.26 (H) 03/14/2017   Lab Results  Component Value Date   NA 140 03/14/2017   K 4.4 03/14/2017   CL 104 03/14/2017   CO2 31 03/14/2017  ]      Outpatient Medications Prior to Visit  Medication Sig Dispense Refill  . acetaminophen (TYLENOL) 500 MG tablet Take 500 mg by mouth every 6 (six) hours as needed (As needed for pain).    [name] alendronate (FOSAMAX) 70 MG tablet TAKE 1 TABLET  EVERY 7  DAYS  WITH A FULL GLASS OF WATER ON AN EMPTY STOMACH 12 tablet 3  . amiodarone (PACERONE) 200 MG tablet Take by mouth.    [name] amoxicillin (AMOXIL) 500 MG capsule Take 500 mg by mouth. Take four tablets prior to dental work.    . Artificial Tear Ointment (EYE LUBRICANT OP) Apply 1 drop to eye 2 (two) times daily.    . Cholecalciferol (VITAMIN D3)  1000 UNITS CAPS Take by mouth.      . diltiazem (CARDIZEM) 30 MG tablet Take 1 tablet (30 mg total) by mouth daily as needed. (Patient taking differently: Take 30 mg by mouth daily as needed. ) 30 tablet 4  . levothyroxine (SYNTHROID, LEVOTHROID) 88 MCG tablet Take 1 tablet (88 mcg total) by mouth daily before breakfast. 90 tablet 1  . losartan (COZAAR) 100 MG tablet Take 1 tablet (100 mg total) by mouth daily. 90 tablet 1  . pravastatin (PRAVACHOL) 20 MG tablet Take 1 tablet (20 mg total) by mouth daily. 90 tablet 1  . warfarin (COUMADIN) 2.5 MG tablet Take by mouth.    . Probiotic Product (PROBIOTIC ADVANCED PO) Take 1 tablet by mouth daily.     No facility-administered medications prior to visit.     Review of Systems;  Patient denies headache, fevers, malaise, unintentional weight loss, skin rash, eye pain, sinus congestion and sinus pain, sore throat, dysphagia,  hemoptysis , cough, dyspnea, wheezing, chest pain, palpitations, orthopnea, edema, abdominal pain, nausea, melena, diarrhea, constipation, flank pain, dysuria, hematuria, urinary  Frequency, nocturia, numbness, tingling, seizures,  Focal weakness, Loss of consciousness,  Tremor, insomnia, depression, anxiety, and suicidal ideation.      Objective:  BP (!) 170/70 (BP Location: Left Arm, Patient Position: Sitting, Cuff Size: Normal)   Pulse (!) 57   Temp 98.3 F (36.8 C) (Oral)  Resp 15   Ht 5\' 4"  (1.626 m)   Wt 175 lb 9.6 oz (79.7 kg)   SpO2 97%   BMI 30.14 kg/m   BP Readings from Last 3 Encounters:  03/14/17 (!) 170/70  01/20/17 118/70  12/16/16 (!) 174/61    Wt Readings from Last 3 Encounters:  03/14/17 175 lb 9.6 oz (79.7 kg)  01/20/17 177 lb 13.9 oz (80.7 kg)  12/16/16 176 lb (79.8 kg)    General appearance: alert, cooperative and appears stated age Ears: normal TM's and external ear canals both ears Throat: lips, mucosa, and tongue normal; teeth and gums normal Neck: no adenopathy, no carotid bruit,  supple, symmetrical, trachea midline and thyroid not enlarged, symmetric, no tenderness/mass/nodules Back: symmetric, no curvature. ROM normal. No CVA tenderness. Lungs: clear to auscultation bilaterally Heart: regular rate and rhythm, S1, S2 normal, no murmur, click, rub or gallop Abdomen: soft, non-tender; bowel sounds normal; no masses,  no organomegaly Pulses: 2+ and symmetric Skin: Skin color, texture, turgor normal. No rashes or lesions Lymph nodes: Cervical, supraclavicular, and axillary nodes normal.  Lab Results  Component Value Date   HGBA1C 5.9 08/20/2016   HGBA1C 6.1 02/20/2016    Lab Results  Component Value Date   CREATININE 1.26 (H) 03/14/2017   CREATININE 1.16 08/20/2016   CREATININE 1.22 (H) 02/20/2016    Lab Results  Component Value Date   WBC 7.8 08/21/2015   HGB 11.6 (L) 08/21/2015   HCT 35.4 (L) 08/21/2015   PLT 290.0 08/21/2015   GLUCOSE 148 (H) 03/14/2017   CHOL 198 08/21/2015   TRIG 81.0 08/21/2015   HDL 71.90 08/21/2015   LDLDIRECT 107.0 03/14/2017   LDLCALC 110 (H) 08/21/2015   ALT 13 03/14/2017   AST 17 03/14/2017   NA 140 03/14/2017   K 4.4 03/14/2017   CL 104 03/14/2017   CREATININE 1.26 (H) 03/14/2017   BUN 33 (H) 03/14/2017   CO2 31 03/14/2017   TSH 1.50 03/14/2017   HGBA1C 5.9 08/20/2016    Mm Digital Screening  Result Date: 01/27/2017 CLINICAL DATA:  Screening. EXAM: DIGITAL SCREENING BILATERAL MAMMOGRAM WITH CAD COMPARISON:  Previous exam(s). ACR Breast Density Category b: There are scattered areas of fibroglandular density. FINDINGS: There are no findings suspicious for malignancy. Images were processed with CAD. IMPRESSION: No mammographic evidence of malignancy. A result letter of this screening mammogram will be mailed directly to the patient. RECOMMENDATION: Screening mammogram in one year. (Code:SM-B-01Y) BI-RADS CATEGORY  1: Negative. Electronically Signed   By: Lajean Manes M.D.   On: 01/27/2017 14:51    Assessment & Plan:     Problem List Items Addressed This Visit    Chronic kidney disease, stage I    Renal function has remained stable and at baseline with avoidance of NSAIDs.  She is on an ARB for control of hypertension,, and tolerating a statin for control of hyperlipidemia.  Will recheck after starting hctz for management of hypertension . Use of hydralazine will likely aggravated her LE edema.   Lab Results  Component Value Date   CREATININE 1.26 (H) 03/14/2017             Chronic right shoulder pain    Persistent post surgical intervention due to scar tissue       Hyperlipidemia   Relevant Medications   hydrochlorothiazide (HYDRODIURIL) 25 MG tablet   Other Relevant Orders   LDL cholesterol, direct (Completed)   Hypertension    Elevated today.  Reviewed list of meds,  patient is not taking OTC meds that could be causing it.  Adding hctz 25 mg daily.  return in 5 days for bmet . Baseline bmet today.       Relevant Medications   hydrochlorothiazide (HYDRODIURIL) 25 MG tablet   Other Relevant Orders   Basic metabolic panel   Basic metabolic panel (Completed)   Basic metabolic panel   Hypothyroidism   Relevant Orders   TSH (Completed)   Lymphedema    Diagnosed with ultrasound by Vascular surgery.  Continue use of compression stockings and being pumping.        Other Visit Diagnoses    Encounter for immunization    -  Primary   Relevant Orders   Flu vaccine HIGH DOSE PF (Completed)   Long-term use of high-risk medication       Relevant Orders   Hepatic function panel (Completed)    A total of 25 minutes of face to face time was spent with patient more than half of which was spent in counselling about the above mentioned conditions  and coordination of care   I have discontinued Ana Cruz's Probiotic Product (PROBIOTIC ADVANCED PO). I am also having her start on hydrochlorothiazide. Additionally, I am having her maintain her Vitamin D3, diltiazem, amoxicillin, levothyroxine,  warfarin, amiodarone, acetaminophen, Artificial Tear Ointment (EYE LUBRICANT OP), alendronate, losartan, and pravastatin.  Meds ordered this encounter  Medications  . hydrochlorothiazide (HYDRODIURIL) 25 MG tablet    Sig: Take 1 tablet (25 mg total) by mouth daily.    Dispense:  30 tablet    Refill:  0    Medications Discontinued During This Encounter  Medication Reason  . Probiotic Product (PROBIOTIC ADVANCED PO) Patient has not taken in last 30 days    Follow-up: Return for NEEDS NONFASTING LAB APPT WEDNESDAY MORNING .   Crecencio Mc, MD

## 2017-03-14 NOTE — Patient Instructions (Addendum)
I am getting a baseline BMET today  I am Adding hctz  To your blood pressure medication.  Take this daily in the morning  With your losartan  return next  wednesday morning for  Repeat  BMET to check your sodium level .  No fasting required   If your BP is not 130/80 after a full week,  Let me know

## 2017-03-16 ENCOUNTER — Encounter: Payer: Self-pay | Admitting: Internal Medicine

## 2017-03-16 NOTE — Assessment & Plan Note (Signed)
Diagnosed with ultrasound by Vascular surgery.  Continue use of compression stockings and being pumping.

## 2017-03-16 NOTE — Assessment & Plan Note (Signed)
Persistent post surgical intervention due to scar tissue

## 2017-03-16 NOTE — Assessment & Plan Note (Signed)
Renal function has remained stable and at baseline with avoidance of NSAIDs.  She is on an ARB for control of hypertension,, and tolerating a statin for control of hyperlipidemia.  Will recheck after starting hctz for management of hypertension . Use of hydralazine will likely aggravated her LE edema.   Lab Results  Component Value Date   CREATININE 1.26 (H) 03/14/2017

## 2017-03-16 NOTE — Assessment & Plan Note (Signed)
Elevated today.  Reviewed list of meds, patient is not taking OTC meds that could be causing it.  Adding hctz 25 mg daily.  return in 5 days for bmet . Baseline bmet today.

## 2017-03-17 ENCOUNTER — Ambulatory Visit (INDEPENDENT_AMBULATORY_CARE_PROVIDER_SITE_OTHER): Payer: Medicare Other | Admitting: Internal Medicine

## 2017-03-17 ENCOUNTER — Encounter: Payer: Self-pay | Admitting: Internal Medicine

## 2017-03-17 DIAGNOSIS — I10 Essential (primary) hypertension: Secondary | ICD-10-CM

## 2017-03-17 DIAGNOSIS — T50905A Adverse effect of unspecified drugs, medicaments and biological substances, initial encounter: Secondary | ICD-10-CM | POA: Diagnosis not present

## 2017-03-17 MED ORDER — AMLODIPINE BESYLATE 2.5 MG PO TABS: 2.5000 mg | ORAL_TABLET | Freq: Every day | ORAL | 3 refills | Status: DC

## 2017-03-17 NOTE — Assessment & Plan Note (Signed)
Developed rash to hctz.   Med stopped.  Starting amlodipine 2.5 mg daily,  Increase to 5 mg after 4 days if not < 140/80

## 2017-03-17 NOTE — Patient Instructions (Signed)
Stop the hctz as directed.  You may have had A DRUG REACTION .   Start the amlodipine at 2.5 mg daily,.  If BP is not < 140/80 by Friday,  You can increase dose to 5 mg daily   Continue losartan   You d not need repeat blood test!!

## 2017-03-17 NOTE — Progress Notes (Signed)
Subjective:  Patient ID: Ana Cruz, female    DOB: [birth year]  Age: 83 y.o. MRN: [id number]  CC: Diagnoses of Adverse effect of drug, initial encounter and Essential hypertension were pertinent to this visit.  HPI Ana Cruz presents for evaluation of rash on hadns and face.    Was seen on Oct 12 for elevated blood pressure and was prescribed hctz .and received the fllu vaccine   First dose of medication was saturday morning  Woke up Sunday morning with both hands and both cheecks feeling hot.  Called the emergency line.  hctz was stopped so she only had one dose.     Has not changed detergents, soaps, moisturizers,  No outside yardwork or exposure to plants or insects.   Outpatient Medications Prior to Visit  Medication Sig Dispense Refill  . acetaminophen (TYLENOL) 500 MG tablet Take 500 mg by mouth every 6 (six) hours as needed (As needed for pain).    [name] alendronate (FOSAMAX) 70 MG tablet TAKE 1 TABLET  EVERY 7  DAYS  WITH A FULL GLASS OF WATER ON AN EMPTY STOMACH 12 tablet 3  . amiodarone (PACERONE) 200 MG tablet Take by mouth.    [name] amoxicillin (AMOXIL) 500 MG capsule Take 500 mg by mouth. Take four tablets prior to dental work.    . Artificial Tear Ointment (EYE LUBRICANT OP) Apply 1 drop to eye 2 (two) times daily.    . Cholecalciferol (VITAMIN D3) 1000 UNITS CAPS Take by mouth.      . diltiazem (CARDIZEM) 30 MG tablet Take 1 tablet (30 mg total) by mouth daily as needed. (Patient taking differently: Take 30 mg by mouth daily as needed. ) 30 tablet 4  . hydrochlorothiazide (HYDRODIURIL) 25 MG tablet Take 1 tablet (25 mg total) by mouth daily. 30 tablet 0  . levothyroxine (SYNTHROID, LEVOTHROID) 88 MCG tablet Take 1 tablet (88 mcg total) by mouth daily before breakfast. 90 tablet 1  . losartan (COZAAR) 100 MG tablet Take 1 tablet (100 mg total) by mouth daily. 90 tablet 1  . pravastatin (PRAVACHOL) 20 MG tablet Take 1 tablet (20 mg total) by mouth daily. 90  tablet 1  . warfarin (COUMADIN) 2.5 MG tablet Take by mouth.     No facility-administered medications prior to visit.     Review of Systems;  Patient denies headache, fevers, malaise, unintentional weight loss, skin rash, eye pain, sinus congestion and sinus pain, sore throat, dysphagia,  hemoptysis , cough, dyspnea, wheezing, chest pain, palpitations, orthopnea, edema, abdominal pain, nausea, melena, diarrhea, constipation, flank pain, dysuria, hematuria, urinary  Frequency, nocturia, numbness, tingling, seizures,  Focal weakness, Loss of consciousness,  Tremor, insomnia, depression, anxiety, and suicidal ideation.      Objective:  BP (!) 170/66 (BP Location: Left Arm, Patient Position: Sitting, Cuff Size: Normal)   Pulse 64   Temp 98.2 F (36.8 C) (Oral)   Resp 15   Ht 5\' 4"  (1.626 m)   Wt 173 lb 6.4 oz (78.7 kg)   SpO2 96%   BMI 29.76 kg/m   BP Readings from Last 3 Encounters:  03/17/17 (!) 170/66  03/14/17 (!) 170/70  01/20/17 118/70    Wt Readings from Last 3 Encounters:  03/17/17 173 lb 6.4 oz (78.7 kg)  03/14/17 175 lb 9.6 oz (79.7 kg)  01/20/17 177 lb 13.9 oz (80.7 kg)    General appearance: alert, cooperative and appears stated age Back: symmetric, no curvature. ROM normal. No CVA tenderness. Lungs: clear  to auscultation bilaterally Heart: regular rate and rhythm, S1, S2 normal, no murmur, click, rub or gallop Abdomen: soft, non-tender; bowel sounds normal; no masses,  no organomegaly Pulses: 2+ and symmetric Skin: left hand with nonblanching macular rash covering the  Dorsum.  2 purpura noted on hand.  Face without redness,  Skin color, texture, turgor normal. No rashes or lesions Lymph nodes: Cervical, supraclavicular, and axillary nodes normal.  Lab Results  Component Value Date   HGBA1C 5.9 08/20/2016   HGBA1C 6.1 02/20/2016    Lab Results  Component Value Date   CREATININE 1.26 (H) 03/14/2017   CREATININE 1.16 08/20/2016   CREATININE 1.22 (H)  02/20/2016    Lab Results  Component Value Date   WBC 7.8 08/21/2015   HGB 11.6 (L) 08/21/2015   HCT 35.4 (L) 08/21/2015   PLT 290.0 08/21/2015   GLUCOSE 148 (H) 03/14/2017   CHOL 198 08/21/2015   TRIG 81.0 08/21/2015   HDL 71.90 08/21/2015   LDLDIRECT 107.0 03/14/2017   LDLCALC 110 (H) 08/21/2015   ALT 13 03/14/2017   AST 17 03/14/2017   NA 140 03/14/2017   K 4.4 03/14/2017   CL 104 03/14/2017   CREATININE 1.26 (H) 03/14/2017   BUN 33 (H) 03/14/2017   CO2 31 03/14/2017   TSH 1.50 03/14/2017   HGBA1C 5.9 08/20/2016    Mm Digital Screening  Result Date: 01/27/2017 CLINICAL DATA:  Screening. EXAM: DIGITAL SCREENING BILATERAL MAMMOGRAM WITH CAD COMPARISON:  Previous exam(s). ACR Breast Density Category b: There are scattered areas of fibroglandular density. FINDINGS: There are no findings suspicious for malignancy. Images were processed with CAD. IMPRESSION: No mammographic evidence of malignancy. A result letter of this screening mammogram will be mailed directly to the patient. RECOMMENDATION: Screening mammogram in one year. (Code:SM-B-01Y) BI-RADS CATEGORY  1: Negative. Electronically Signed   By: Lajean Manes M.D.   On: 01/27/2017 14:51    Assessment & Plan:   Problem List Items Addressed This Visit    Adverse drug reaction    Developed macular rash on hands and cheeks  Approximately 15 hours after taking her first dose of hctz.  Med stopped after one dose,  Rash improving.        Hypertension    Developed rash to hctz.   Med stopped.  Starting amlodipine 2.5 mg daily,  Increase to 5 mg after 4 days if not < 140/80      Relevant Medications   amLODipine (NORVASC) 2.5 MG tablet      I am having Ms. Situ start on amLODipine. I am also having her maintain her Vitamin D3, diltiazem, amoxicillin, levothyroxine, warfarin, amiodarone, acetaminophen, Artificial Tear Ointment (EYE LUBRICANT OP), alendronate, losartan, pravastatin, and hydrochlorothiazide.  Meds  ordered this encounter  Medications  . amLODipine (NORVASC) 2.5 MG tablet    Sig: Take 1 tablet (2.5 mg total) by mouth daily.    Dispense:  30 tablet    Refill:  3    There are no discontinued medications.  Follow-up: No Follow-up on file.   Crecencio Mc, MD

## 2017-03-17 NOTE — Assessment & Plan Note (Signed)
Developed macular rash on hands and cheeks  Approximately 15 hours after taking her first dose of hctz.  Med stopped after one dose,  Rash improving.

## 2017-03-19 ENCOUNTER — Other Ambulatory Visit: Payer: Self-pay

## 2017-03-31 ENCOUNTER — Ambulatory Visit: Payer: Medicare Other | Admitting: Podiatry

## 2017-04-10 ENCOUNTER — Other Ambulatory Visit: Payer: Self-pay | Admitting: Internal Medicine

## 2017-04-21 ENCOUNTER — Ambulatory Visit (INDEPENDENT_AMBULATORY_CARE_PROVIDER_SITE_OTHER): Payer: Medicare Other | Admitting: Podiatry

## 2017-04-21 ENCOUNTER — Encounter: Payer: Self-pay | Admitting: Podiatry

## 2017-04-21 DIAGNOSIS — M79676 Pain in unspecified toe(s): Secondary | ICD-10-CM

## 2017-04-21 DIAGNOSIS — B351 Tinea unguium: Secondary | ICD-10-CM | POA: Diagnosis not present

## 2017-04-21 NOTE — Progress Notes (Addendum)
Complaint:  Visit Type: Patient returns to my office for continued preventative foot care services. Complaint: Patient states" my nails have grown long and thick and become painful to walk and wear shoes" . The patient presents for preventative foot care services. No changes to ROS  Podiatric Exam: Vascular: dorsalis pedis and posterior tibial pulses are palpable bilateral. Capillary return is immediate. Temperature gradient is WNL. Skin turgor WNL  Sensorium: Normal Semmes Weinstein monofilament test. Normal tactile sensation bilaterally. Nail Exam: Pt has thick disfigured discolored nails with subungual debris noted bilateral entire nail hallux through fifth toenails Ulcer Exam: There is no evidence of ulcer or pre-ulcerative changes or infection. Orthopedic Exam: Muscle tone and strength are WNL. No limitations in general ROM. No crepitus or effusions noted. Foot type and digits show no abnormalities. HAV  B/L.  Hammer toe B/L. Skin:  Porokeratosis sub 2 right foot.. No infection or ulcers  Diagnosis:  Onychomycosis, , Pain in right toe, pain in left toes Debride porokeratosis  Treatment & Plan Procedures and Treatment: Consent by patient was obtained for treatment procedures.   Debridement of mycotic and hypertrophic toenails, 1 through 5 bilateral and clearing of subungual debris. No ulceration, no infection noted.  Return Visit-Office Procedure: Patient instructed to return to the office for a follow up visit 11 weeks  for continued evaluation and treatment.    Gardiner Barefoot DPM

## 2017-05-01 ENCOUNTER — Encounter (INDEPENDENT_AMBULATORY_CARE_PROVIDER_SITE_OTHER): Payer: Self-pay | Admitting: Vascular Surgery

## 2017-05-01 ENCOUNTER — Ambulatory Visit (INDEPENDENT_AMBULATORY_CARE_PROVIDER_SITE_OTHER): Payer: Medicare Other | Admitting: Vascular Surgery

## 2017-05-01 VITALS — BP 148/67 | HR 58 | Resp 16 | Ht 64.0 in | Wt 172.0 lb

## 2017-05-01 DIAGNOSIS — I10 Essential (primary) hypertension: Secondary | ICD-10-CM

## 2017-05-01 DIAGNOSIS — I890 Lymphedema, not elsewhere classified: Secondary | ICD-10-CM

## 2017-05-01 DIAGNOSIS — E034 Atrophy of thyroid (acquired): Secondary | ICD-10-CM | POA: Diagnosis not present

## 2017-05-01 DIAGNOSIS — E782 Mixed hyperlipidemia: Secondary | ICD-10-CM

## 2017-05-01 DIAGNOSIS — I872 Venous insufficiency (chronic) (peripheral): Secondary | ICD-10-CM

## 2017-05-01 NOTE — Progress Notes (Signed)
MRN : [id number]  Ana Cruz is a 83 y.o. ([birth year]) female who presents with chief complaint of leg swelling right more than left.  History of Present Illness:  The patient returns to the office for followup evaluation regarding leg swelling.  The swelling has persisted but with the lymph pump the patient states the swelling is much better controlled. The pain associated with swelling is essentially eliminated. There have not been any interval development of a ulcerations or wounds.  No episodes of cellulitis or infection over the past 12 months  The patient denies problems with the pump, noting it is working well and the leggings are in good condition.  Since the previous visit the patient has been wearing graduated compression stockings and using the lymph pump on a routine basis and  has noted significant improvement in the lymphedema.   Patient stated the lymph pump has been a very positive factor in her care.     No outpatient medications have been marked as taking for the 05/01/17 encounter (Appointment) with Delana Meyer, Dolores Lory, MD.    Past Medical History:  Diagnosis Date  . allergic rhinitis   . Atrial fibrillation ([place])   . Chronic kidney disease, stage I    mild, did improve with cessation of NSAIDs  . Elbow fracture, left    repaired Jan 2000  . Hyperlipidemia   . Hypertension   . hypothyroidism    medicated since age 82, no prior surgery  . 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 ABDOMINAL HYSTERECTOMY  1979  . APPENDECTOMY  1969  . BILATERAL OOPHORECTOMY     squential  . BLADDER REPAIR  1980  . OVARIAN CYST REMOVAL  April 1969  . REPLACEMENT TOTAL KNEE BILATERAL  2009  . reverse shoulder surgery  04/05/2016  . ROTATOR CUFF REPAIR  Jan 2000  . ROTATOR CUFF REPAIR  2000    Social History Social History   Tobacco Use  . Smoking status: Never Smoker  .  Smokeless tobacco: Never Used  Substance Use Topics  . Alcohol use: Yes  . Drug use: No    Family History Family History  Problem Relation Age of Onset  . Hypertension Mother   . Dementia Mother   . Heart disease Father 51       MI  . Cancer Paternal Grandfather 62       colon  . Breast cancer Neg Hx     Allergies  Allergen Reactions  . Chocolate Flavor Hives  . Clindamycin/Lincomycin     Tongue swelled,  Throat felt funny  . Onion Swelling    Throat swelling  . Other Hives    Raw Onions, Spring Time causes eye itching and runny nose. *Pt. Also Had Large Blisters from Tourniquets applied to her legs during her knee replacement surgery*  . Tape   . Hctz [Hydrochlorothiazide] Rash    rash     REVIEW OF SYSTEMS (Negative unless checked)  Constitutional: [] Weight loss  [] Fever  [] Chills Cardiac: [] Chest pain   [] Chest pressure   [] Palpitations   [] Shortness of breath when laying flat   [] Shortness of breath with exertion. Vascular:  [x] Pain in legs with walking   [] Pain in legs at rest  [] History of DVT   [] Phlebitis   [x] Swelling in legs   [] Varicose veins   [] Non-healing ulcers Pulmonary:   [] Uses home oxygen   []   Productive cough   [] Hemoptysis   [] Wheeze  [] COPD   [] Asthma Neurologic:  [] Dizziness   [] Seizures   [] History of stroke   [] History of TIA  [] Aphasia   [] Vissual changes   [] Weakness or numbness in arm   [] Weakness or numbness in leg Musculoskeletal:   [] Joint swelling   [x] Joint pain   [] Low back pain Hematologic:  [] Easy bruising  [] Easy bleeding   [] Hypercoagulable state   [] Anemic Gastrointestinal:  [] Diarrhea   [] Vomiting  [] Gastroesophageal reflux/heartburn   [] Difficulty swallowing. Genitourinary:  [] Chronic kidney disease   [] Difficult urination  [] Frequent urination   [] Blood in urine Skin:  [] Rashes   [] Ulcers  Psychological:  [] History of anxiety   []  History of major depression.  Physical Examination  There were no vitals filed for this  visit. There is no height or weight on file to calculate BMI. Gen: WD/WN, NAD Head: [place]/AT, No temporalis wasting.  Ear/Nose/Throat: Hearing grossly intact, nares w/o erythema or drainage Eyes: PER, EOMI, sclera nonicteric.  Neck: Supple, no large masses.   Pulmonary:  Good air movement, no audible wheezing bilaterally, no use of accessory muscles.  Cardiac: RRR, no JVD Vascular: 1+ edema bilaterally with mild venous changes Vessel Right Left  Radial Palpable Palpable  PT Palpable Palpable  DP Palpable Palpable  Gastrointestinal: Non-distended. No guarding/no peritoneal signs.  Musculoskeletal: M/S 5/5 throughout.  No deformity or atrophy.  Neurologic: CN 2-12 intact. Symmetrical.  Speech is fluent. Motor exam as listed above. Psychiatric: Judgment intact, Mood & affect appropriate for pt's clinical situation. Dermatologic: venous rashes no ulcers noted.  No changes consistent with cellulitis. Lymph : No lichenification or skin changes of chronic lymphedema.  CBC Lab Results  Component Value Date   WBC 7.8 08/21/2015   HGB 11.6 (L) 08/21/2015   HCT 35.4 (L) 08/21/2015   MCV 93.4 08/21/2015   PLT 290.0 08/21/2015    BMET    Component Value Date/Time   NA 140 03/14/2017 1514   NA 137 06/17/2014 1454   K 4.4 03/14/2017 1514   K 4.1 06/17/2014 1454   CL 104 03/14/2017 1514   CL 105 06/17/2014 1454   CO2 31 03/14/2017 1514   CO2 23 06/17/2014 1454   GLUCOSE 148 (H) 03/14/2017 1514   GLUCOSE 131 (H) 06/17/2014 1454   BUN 33 (H) 03/14/2017 1514   BUN 31 (H) 06/17/2014 1454   CREATININE 1.26 (H) 03/14/2017 1514   CREATININE 1.37 (H) 06/17/2014 1454   CALCIUM 8.9 03/14/2017 1514   CALCIUM 9.0 06/17/2014 1454   GFRNONAA 40 (L) 06/17/2014 1454   GFRAA 48 (L) 06/17/2014 1454   CrCl cannot be calculated (Patient's most recent lab result is older than the maximum 21 days allowed.).  COAG No results found for: INR, PROTIME  Radiology No results found.  Assessment/Plan 1.  Lymphedema  No surgery or intervention at this point in time.    I have reviewed my discussion with the patient regarding lymphedema and why it  causes symptoms.  Patient will continue wearing graduated compression stockings class 1 (20-30 mmHg) on a daily basis a prescription was given. The patient is reminded to put the stockings on first thing in the morning and removing them in the evening. The patient is instructed specifically not to sleep in the stockings.   In addition, behavioral modification throughout the day will be continued.  This will include frequent elevation (such as in a recliner), use of over the counter pain medications as needed and exercise  such as walking.  I have reviewed systemic causes for chronic edema such as liver, kidney and cardiac etiologies and there does not appear to be any significant changes in these organ systems over the past year.  The patient is under the impression that these organ systems are all stable and unchanged.    The patient will continue aggressive use of the  lymph pump.  This will continue to improve the edema control and prevent sequela such as ulcers and infections.   The patient will follow-up with me on an annual basis.    2. Chronic venous insufficiency  No surgery or intervention at this point in time.    I have reviewed my discussion with the patient regarding lymphedema and why it  causes symptoms.  Patient will continue wearing graduated compression stockings class 1 (20-30 mmHg) on a daily basis a prescription was given. The patient is reminded to put the stockings on first thing in the morning and removing them in the evening. The patient is instructed specifically not to sleep in the stockings.   In addition, behavioral modification throughout the day will be continued.  This will include frequent elevation (such as in a recliner), use of over the counter pain medications as needed and exercise such as walking.  I have reviewed  systemic causes for chronic edema such as liver, kidney and cardiac etiologies and there does not appear to be any significant changes in these organ systems over the past year.  The patient is under the impression that these organ systems are all stable and unchanged.    The patient will continue aggressive use of the  lymph pump.  This will continue to improve the edema control and prevent sequela such as ulcers and infections.   The patient will follow-up with me on an annual basis.    3. Essential hypertension Continue antihypertensive medications as already ordered, these medications have been reviewed and there are no changes at this time.   4. Hypothyroidism due to acquired atrophy of thyroid Continue Synthroid as already ordered, these medications have been reviewed and there are no changes at this time.   5. Mixed hyperlipidemia Continue statin as ordered and reviewed, no changes at this time     Hortencia Pilar, MD  05/01/2017 1:08 PM

## 2017-05-28 DIAGNOSIS — R001 Bradycardia, unspecified: Secondary | ICD-10-CM | POA: Insufficient documentation

## 2017-06-02 ENCOUNTER — Other Ambulatory Visit: Payer: Self-pay

## 2017-06-02 MED ORDER — AMLODIPINE BESYLATE 2.5 MG PO TABS: 2.5000 mg | ORAL_TABLET | Freq: Every day | ORAL | 0 refills | Status: DC

## 2017-06-03 ENCOUNTER — Other Ambulatory Visit: Payer: Self-pay | Admitting: Internal Medicine

## 2017-06-10 ENCOUNTER — Ambulatory Visit (INDEPENDENT_AMBULATORY_CARE_PROVIDER_SITE_OTHER): Payer: Medicare Other | Admitting: Internal Medicine

## 2017-06-10 ENCOUNTER — Encounter: Payer: Self-pay | Admitting: Internal Medicine

## 2017-06-10 VITALS — BP 134/64 | HR 51 | Temp 97.8°F | Resp 15 | Ht 64.0 in | Wt 170.4 lb

## 2017-06-10 DIAGNOSIS — E034 Atrophy of thyroid (acquired): Secondary | ICD-10-CM | POA: Diagnosis not present

## 2017-06-10 DIAGNOSIS — G8929 Other chronic pain: Secondary | ICD-10-CM | POA: Diagnosis not present

## 2017-06-10 DIAGNOSIS — M48061 Spinal stenosis, lumbar region without neurogenic claudication: Secondary | ICD-10-CM

## 2017-06-10 DIAGNOSIS — M5416 Radiculopathy, lumbar region: Secondary | ICD-10-CM | POA: Diagnosis not present

## 2017-06-10 DIAGNOSIS — I10 Essential (primary) hypertension: Secondary | ICD-10-CM | POA: Diagnosis not present

## 2017-06-10 DIAGNOSIS — M8448XD Pathological fracture, other site, subsequent encounter for fracture with routine healing: Secondary | ICD-10-CM | POA: Diagnosis not present

## 2017-06-10 DIAGNOSIS — M545 Low back pain, unspecified: Secondary | ICD-10-CM

## 2017-06-10 MED ORDER — LEVOTHYROXINE SODIUM 88 MCG PO TABS: 88.0000 ug | ORAL_TABLET | Freq: Every day | ORAL | 1 refills | Status: DC

## 2017-06-10 MED ORDER — ZOSTER VAC RECOMB ADJUVANTED 50 MCG/0.5ML IM SUSR: 0.5000 mL | Freq: Once | INTRAMUSCULAR | 1 refills | Status: AC

## 2017-06-10 NOTE — Patient Instructions (Addendum)
Finish your currently supply of alendronate  ( 3 months)    We will then stop the medicatio nand repeat your DEXA (aroudn April/May 2019)  and again one year later to reassess need for continued medication

## 2017-06-10 NOTE — Progress Notes (Signed)
Subjective:  Patient ID: Ana Cruz, female    DOB: 01-31-1934  Age: 84 y.o. MRN: [id number]  CC: The primary encounter diagnosis was Chronic midline low back pain without sciatica. Diagnoses of Bilateral sacral insufficiency fracture with routine healing, Essential hypertension, Hypothyroidism due to acquired atrophy of thyroid, and Spinal stenosis of lumbar region with radiculopathy were also pertinent to this visit.  HPI Ana Cruz presents for follow up multiple issues.    Cc: feels  That she has lost arm and leg strength over the winter,  Since her shoulder surgery,  Doesn't feel confident without use of a cane.  Told she Tilts when she walks and that her balance is poor.  Was taking water aerobics,  Has resumed it as of last week now that her husband Ana Cruz  Has finished outpatient PT   referral to [facility]  Low back pain   Hypertension: patient checks blood pressure twice weekly at home.  Readings have been for the most part  Are 824 to 235 systolic at rest . Patient is following a reduced salt diet most days and is taking medications as prescribed  Lab Results  Component Value Date   TSH 1.50 03/14/2017   Lab Results  Component Value Date   ALT 13 03/14/2017   AST 17 03/14/2017   ALKPHOS 85 03/14/2017   BILITOT 0.3 03/14/2017      Outpatient Medications Prior to Visit  Medication Sig Dispense Refill  . acetaminophen (TYLENOL) 500 MG tablet Take 500 mg by mouth every 6 (six) hours as needed (As needed for pain).    [name] alendronate (FOSAMAX) 70 MG tablet TAKE 1 TABLET  EVERY 7  DAYS  WITH A FULL GLASS OF WATER ON AN EMPTY STOMACH 12 tablet 3  . amiodarone (PACERONE) 200 MG tablet Take by mouth.    [name] amLODipine (NORVASC) 2.5 MG tablet Take 1 tablet (2.5 mg total) by mouth daily. 90 tablet 0  . amoxicillin (AMOXIL) 500 MG capsule Take 500 mg by mouth. Take four tablets prior to dental work.    . Artificial Tear Ointment (EYE LUBRICANT OP)  Apply 1 drop to eye 2 (two) times daily.    . Cholecalciferol (VITAMIN D3) 1000 UNITS CAPS Take by mouth.      . diltiazem (CARDIZEM) 30 MG tablet Take 1 tablet (30 mg total) by mouth daily as needed. 30 tablet 4  . pravastatin (PRAVACHOL) 20 MG tablet TAKE 1 TABLET BY MOUTH EVERY DAY 90 tablet 1  . telmisartan (MICARDIS) 40 MG tablet Take 40 mg by mouth daily.    [name] warfarin (COUMADIN) 2.5 MG tablet Take by mouth.    . hydrochlorothiazide (HYDRODIURIL) 25 MG tablet TAKE 1 TABLET BY MOUTH EVERY DAY (Patient not taking: Reported on 05/01/2017) 30 tablet 2  . levothyroxine (SYNTHROID, LEVOTHROID) 88 MCG tablet TAKE 1 TABLET (88 MCG TOTAL) BY MOUTH DAILY BEFORE BREAKFAST. 90 tablet 1  . losartan (COZAAR) 100 MG tablet Take 1 tablet (100 mg total) by mouth daily. (Patient not taking: Reported on 05/01/2017) 90 tablet 1   No facility-administered medications prior to visit.     Review of Systems;  Patient denies headache, fevers, malaise, unintentional weight loss, skin rash, eye pain, sinus congestion and sinus pain, sore throat, dysphagia,  hemoptysis , cough, dyspnea, wheezing, chest pain, palpitations, orthopnea, edema, abdominal pain, nausea, melena, diarrhea, constipation, flank pain, dysuria, hematuria, urinary  Frequency, nocturia, numbness, tingling, seizures,  Focal weakness, Loss of consciousness,  Tremor, insomnia, depression, anxiety, and suicidal ideation.      Objective:  BP 134/64 (BP Location: Left Arm, Patient Position: Sitting, Cuff Size: Normal)   Pulse (!) 51   Temp 97.8 F (36.6 C) (Oral)   Resp 15   Ht 5\' 4"  (1.626 m)   Wt 170 lb 6.4 oz (77.3 kg)   SpO2 93%   BMI 29.25 kg/m   BP Readings from Last 3 Encounters:  06/10/17 134/64  05/01/17 (!) 148/67  03/17/17 (!) 170/66    Wt Readings from Last 3 Encounters:  06/10/17 170 lb 6.4 oz (77.3 kg)  05/01/17 172 lb (78 kg)  03/17/17 173 lb 6.4 oz (78.7 kg)    General appearance: alert, cooperative and appears  stated age Ears: normal TM's and external ear canals both ears Throat: lips, mucosa, and tongue normal; teeth and gums normal Neck: no adenopathy, no carotid bruit, supple, symmetrical, trachea midline and thyroid not enlarged, symmetric, no tenderness/mass/nodules Back: symmetric, no curvature. ROM normal. No CVA tenderness. Lungs: clear to auscultation bilaterally Heart: regular rate and rhythm, S1, S2 normal, no murmur, click, rub or gallop Abdomen: soft, non-tender; bowel sounds normal; no masses,  no organomegaly Pulses: 2+ and symmetric Skin: Skin color, texture, turgor normal. No rashes or lesions Lymph nodes: Cervical, supraclavicular, and axillary nodes normal.  Lab Results  Component Value Date   HGBA1C 5.9 08/20/2016   HGBA1C 6.1 02/20/2016    Lab Results  Component Value Date   CREATININE 1.26 (H) 03/14/2017   CREATININE 1.16 08/20/2016   CREATININE 1.22 (H) 02/20/2016    Lab Results  Component Value Date   WBC 7.8 08/21/2015   HGB 11.6 (L) 08/21/2015   HCT 35.4 (L) 08/21/2015   PLT 290.0 08/21/2015   GLUCOSE 148 (H) 03/14/2017   CHOL 198 08/21/2015   TRIG 81.0 08/21/2015   HDL 71.90 08/21/2015   LDLDIRECT 107.0 03/14/2017   LDLCALC 110 (H) 08/21/2015   ALT 13 03/14/2017   AST 17 03/14/2017   NA 140 03/14/2017   K 4.4 03/14/2017   CL 104 03/14/2017   CREATININE 1.26 (H) 03/14/2017   BUN 33 (H) 03/14/2017   CO2 31 03/14/2017   TSH 1.50 03/14/2017   HGBA1C 5.9 08/20/2016    Assessment & Plan:   Problem List Items Addressed This Visit    Bilateral sacral insufficiency fracture with routine healing    Diagnostic of  osteoporosis.  Has been taking alendronate for nearly 5 years.   She will have a repeat DEXA this year followed by a 1 year drug holiday .  If T scores fall, alternative  Therapy will be advised       Hypertension    Well controlled on current regimen. Renal function stable, no changes today.      Hypothyroidism    Thyroid function is  WNL on current dose.  No current changes needed.   Lab Results  Component Value Date   TSH 1.50 03/14/2017         Relevant Medications   levothyroxine (SYNTHROID, LEVOTHROID) 88 MCG tablet   Spinal stenosis of lumbar region with radiculopathy    She has developed lower extremity weakness due to lack of exercise and preexisting spinal stenosis and is now  using a cane. She would benefit from PT at [facility] to improve her balance and proximal muscle strength.        Other Visit Diagnoses    Chronic midline low back pain without sciatica    -  Primary   Relevant Orders   Ambulatory referral to Physical Therapy      I have discontinued Marcie Bal L. Bernales's losartan and hydrochlorothiazide. I am also having her start on Zoster Vaccine Adjuvanted. Additionally, I am having her maintain her Vitamin D3, diltiazem, amoxicillin, warfarin, amiodarone, acetaminophen, Artificial Tear Ointment (EYE LUBRICANT OP), alendronate, telmisartan, amLODipine, pravastatin, and levothyroxine.  Meds ordered this encounter  Medications  . levothyroxine (SYNTHROID, LEVOTHROID) 88 MCG tablet    Sig: Take 1 tablet (88 mcg total) by mouth daily before breakfast.    Dispense:  90 tablet    Refill:  1  . Zoster Vaccine Adjuvanted [facility]) injection    Sig: Inject 0.5 mLs into the muscle once for 1 dose.    Dispense:  1 each    Refill:  1    Medications Discontinued During This Encounter  Medication Reason  . hydrochlorothiazide (HYDRODIURIL) 25 MG tablet Allergic reaction  . losartan (COZAAR) 100 MG tablet Change in therapy  . levothyroxine (SYNTHROID, LEVOTHROID) 88 MCG tablet Reorder    Follow-up: Return in about 6 months (around 12/08/2017) for fasting labs rprior to visit .   Crecencio Mc, MD

## 2017-06-12 NOTE — Assessment & Plan Note (Signed)
She has developed lower extremity weakness due to lack of exercise and preexisting spinal stenosis and is now  using a cane. She would benefit from PT at [facility] to improve her balance and proximal muscle strength.

## 2017-06-12 NOTE — Assessment & Plan Note (Signed)
Thyroid function is WNL on current dose.  No current changes needed.   Lab Results  Component Value Date   TSH 1.50 03/14/2017

## 2017-06-12 NOTE — Assessment & Plan Note (Addendum)
Diagnostic of  osteoporosis.  Has been taking alendronate for nearly 5 years.   She will have a repeat DEXA this year followed by a 1 year drug holiday .  If T scores fall, alternative  Therapy will be advised

## 2017-06-12 NOTE — Assessment & Plan Note (Signed)
Well controlled on current regimen. Renal function stable, no changes today. 

## 2017-07-14 ENCOUNTER — Encounter: Payer: Self-pay | Admitting: Podiatry

## 2017-07-14 ENCOUNTER — Ambulatory Visit (INDEPENDENT_AMBULATORY_CARE_PROVIDER_SITE_OTHER): Payer: Medicare Other | Admitting: Podiatry

## 2017-07-14 DIAGNOSIS — M79676 Pain in unspecified toe(s): Secondary | ICD-10-CM | POA: Diagnosis not present

## 2017-07-14 DIAGNOSIS — B351 Tinea unguium: Secondary | ICD-10-CM | POA: Diagnosis not present

## 2017-07-14 NOTE — Progress Notes (Signed)
Complaint:  Visit Type: Patient returns to my office for continued preventative foot care services. Complaint: Patient states" my nails have grown long and thick and become painful to walk and wear shoes" . The patient presents for preventative foot care services. No changes to ROS  Podiatric Exam: Vascular: dorsalis pedis and posterior tibial pulses are palpable bilateral. Capillary return is immediate. Temperature gradient is WNL. Skin turgor WNL  Sensorium: Normal Semmes Weinstein monofilament test. Normal tactile sensation bilaterally. Nail Exam: Pt has thick disfigured discolored nails with subungual debris noted bilateral entire nail hallux through fifth toenails Ulcer Exam: There is no evidence of ulcer or pre-ulcerative changes or infection. Orthopedic Exam: Muscle tone and strength are WNL. No limitations in general ROM. No crepitus or effusions noted. Foot type and digits show no abnormalities. HAV  B/L.  Hammer toe B/L. Skin:  . No infection or ulcers.  Porokeratosis has been replaced with desquamation through acid usage.  Diagnosis:  Onychomycosis, , Pain in right toe, pain in left toes   Treatment & Plan Procedures and Treatment: Consent by patient was obtained for treatment procedures.   Debridement of mycotic and hypertrophic toenails, 1 through 5 bilateral and clearing of subungual debris. No ulceration, no infection noted.  Return Visit-Office Procedure: Patient instructed to return to the office for a follow up visit 12 weeks  for continued evaluation and treatment.    Gardiner Barefoot DPM

## 2017-08-24 ENCOUNTER — Other Ambulatory Visit: Payer: Self-pay | Admitting: Internal Medicine

## 2017-09-11 ENCOUNTER — Ambulatory Visit (INDEPENDENT_AMBULATORY_CARE_PROVIDER_SITE_OTHER): Payer: Medicare Other

## 2017-09-11 VITALS — BP 134/62 | HR 52 | Temp 98.0°F | Resp 14 | Ht 63.5 in | Wt 170.8 lb

## 2017-09-11 DIAGNOSIS — Z0000 Encounter for general adult medical examination without abnormal findings: Secondary | ICD-10-CM

## 2017-09-11 NOTE — Progress Notes (Signed)
Subjective:   Ana Cruz is a 84 y.o. female who presents for Medicare Annual (Subsequent) preventive examination.  Review of Systems:  No ROS.  Medicare Wellness Visit. Additional risk factors are reflected in the social history. Cardiac Risk Factors include: advanced age (>21men, >59 women);hypertension     Objective:     Vitals: BP 134/62 (BP Location: Left Arm, Patient Position: Sitting, Cuff Size: Normal)   Pulse (!) 52   Temp 98 F (36.7 C) (Oral)   Resp 14   Ht 5' 3.5" (1.613 m)   Wt 170 lb 12.8 oz (77.5 kg)   SpO2 96%   BMI 29.78 kg/m   Body mass index is 29.78 kg/m.  Advanced Directives 09/11/2017 01/20/2017 09/10/2016 11/16/2015 07/06/2015  Does Patient Have a Medical Advance Directive? Yes Yes Yes Yes No  Type of Paramedic of [place];Living will [place];Living will [place];Living will Living will -  Does patient want to make changes to medical advance directive? No - Patient declined - No - Patient declined No - Patient declined -  Copy of [place] in Chart? No - copy requested - No - copy requested Yes -  Would patient like information on creating a medical advance directive? - - - - No - patient declined information    Tobacco Social History   Tobacco Use  Smoking Status Never Smoker  Smokeless Tobacco Never Used     Counseling given: Not Answered   Clinical Intake:  Pre-visit preparation completed: Yes  Pain : No/denies pain     Nutritional Status: BMI 25 -29 Overweight Diabetes: No  How often do you need to have someone help you when you read instructions, pamphlets, or other written materials from your doctor or pharmacy?: 1 - Never  Interpreter Needed?: No     Past Medical History:  Diagnosis Date  . allergic rhinitis   . Atrial fibrillation ([place])   . Chronic kidney disease, stage I    mild, did improve with cessation of NSAIDs  . Elbow  fracture, left    repaired Jan 2000  . Hyperlipidemia   . Hypertension   . hypothyroidism    medicated since age 38, no prior surgery  . 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 ABDOMINAL HYSTERECTOMY  1979  . APPENDECTOMY  1969  . BILATERAL OOPHORECTOMY     squ[place]  . BLADDER REPAIR  1980  . OVARIAN CYST REMOVAL  April 1969  . REPLACEMENT TOTAL KNEE BILATERAL  2009  . reverse shoulder surgery  04/05/2016  . ROTATOR CUFF REPAIR  Jan 2000  . ROTATOR CUFF REPAIR  2000   Family History  Problem Relation Age of Onset  . Hypertension Mother   . Dementia Mother   . Heart disease Father 31       MI  . Cancer Paternal Grandfather 60       colon  . Breast cancer Neg Hx    Social History   Socioeconomic History  . Marital status: Married    Spouse name: Not on file  . Number of children: Not on file  . Years of education: Not on file  . Highest education level: Not on file  Occupational History  . Not on file  Social Needs  . Financial resource strain: Not hard at all  . Food insecurity:  Worry: Never true    Inability: Never true  . Transportation needs:    Medical: No    Non-medical: No  Tobacco Use  . Smoking status: Never Smoker  . Smokeless tobacco: Never Used  Substance and Sexual Activity  . Alcohol use: Yes  . Drug use: No  . Sexual activity: Not on file  Lifestyle  . Physical activity:    Days per week: Not on file    Minutes per session: Not on file  . Stress: Not on file  Relationships  . Social connections:    Talks on phone: Not on file    Gets together: Not on file    Attends religious service: Not on file    Active member of club or organization: Not on file    Attends meetings of clubs or organizations: Not on file    Relationship status: Not on file  Other Topics Concern  . Not on file  Social History Narrative  . Not on file    Outpatient  Encounter Medications as of 09/11/2017  Medication Sig  . acetaminophen (TYLENOL) 500 MG tablet Take 500 mg by mouth every 6 (six) hours as needed (As needed for pain).  [name] amLODipine (NORVASC) 2.5 MG tablet TAKE 1 TABLET BY MOUTH EVERY DAY  . amoxicillin (AMOXIL) 500 MG capsule Take 500 mg by mouth. Take four tablets prior to dental work.  . Artificial Tear Ointment (EYE LUBRICANT OP) Apply 1 drop to eye 2 (two) times daily.  . Cholecalciferol (VITAMIN D3) 1000 UNITS CAPS Take by mouth.    . levothyroxine (SYNTHROID, LEVOTHROID) 88 MCG tablet Take 1 tablet (88 mcg total) by mouth daily before breakfast.  . Liniments (ACE PAIN RELIEVING PATCH EX) Apply 1 patch topically daily as needed.  . pravastatin (PRAVACHOL) 20 MG tablet TAKE 1 TABLET BY MOUTH EVERY DAY  . telmisartan (MICARDIS) 40 MG tablet Take 40 mg by mouth daily.  . [DISCONTINUED] alendronate (FOSAMAX) 70 MG tablet TAKE 1 TABLET  EVERY 7  DAYS  WITH A FULL GLASS OF WATER ON AN EMPTY STOMACH  . [DISCONTINUED] diltiazem (CARDIZEM) 30 MG tablet Take 1 tablet (30 mg total) by mouth daily as needed.  [name] amiodarone (PACERONE) 200 MG tablet Take by mouth.  . warfarin (COUMADIN) 2.5 MG tablet Take by mouth.   No facility-administered encounter medications on file as of 09/11/2017.     Activities of Daily Living In your present state of health, do you have any difficulty performing the following activities: 09/11/2017  Hearing? N  Vision? N  Difficulty concentrating or making decisions? N  Walking or climbing stairs? Y  Comment Unsteady gait  Dressing or bathing? N  Doing errands, shopping? N  Preparing Food and eating ? N  Using the Toilet? N  In the past six months, have you accidently leaked urine? Y  Comment Managed with a daily pad  Do you have problems with loss of bowel control? N  Managing your Medications? N  Managing your Finances? N  Housekeeping or managing your Housekeeping? Y  Comment Staff assists  Some recent data  might be hidden    Patient Care Team: Crecencio Mc, MD as PCP - General (Internal Medicine)    Assessment:   This is a routine wellness examination for Ana Cruz.  The goal of the wellness visit is to assist the patient how to close the gaps in care and create a preventative care plan for the patient.   The roster of all  physicians providing medical care to patient is listed in the Snapshot section of the chart.  Taking calcium VIT D as appropriate/Osteoporosis risk reviewed.    Safety issues reviewed; Smoke and carbon monoxide detectors in the home. No firearms or firearms locked in a safe within the home. Wears seatbelts when driving or riding with others. No violence in the home.  They do not have excessive sun exposure.  Discussed the need for sun protection: hats, long sleeves and the use of sunscreen if there is significant sun exposure.  Patient is alert, normal appearance, oriented to person/place/and time.  Correctly identified the president of the [place] and recalls of 3/3 words. Performs simple calculations and can read correct time from watch face. Displays appropriate judgement.  No new identified risk were noted.  No failures at ADL's or IADL's.  Ambulates with cane.  BMI- discussed the importance of a healthy diet, water intake and the benefits of aerobic exercise. Educational material provided.   24 hour diet recall:  Dental- UTD.  Eye- Visual acuity not assessed per patient preference since they have regular follow up with the ophthalmologist.  Wears corrective lenses.  Sleep patterns- Sleeps 6-8 hours at night.  Wakes feeling rested.   Health maintenance gaps- closed.  Patient Concerns: None at this time. Follow up with PCP as needed.  Exercise Activities and Dietary recommendations Current Exercise Habits: Structured exercise class, Time (Minutes): 60, Frequency (Times/Week): 2, Weekly Exercise (Minutes/Week): 120, Intensity: Moderate  Goals    . DIET -  INCREASE WATER INTAKE       Fall Risk Fall Risk  09/11/2017 09/10/2016 08/20/2016 08/21/2015 08/21/2015  Falls in the past year? Yes No No No No  Number falls in past yr: 1 - - - -  Injury with Fall? No - - - -  Follow up Falls prevention discussed;Education provided - - - -   Depression Screen PHQ 2/9 Scores 09/11/2017 09/10/2016 08/20/2016 08/21/2015  PHQ - 2 Score 0 0 0 0     Cognitive Function     6CIT Screen 09/10/2016  What Year? 0 points  What month? 0 points  What time? 0 points  Count back from 20 0 points  Months in reverse 0 points  Repeat phrase 0 points  Total Score 0    Immunization History  Administered Date(s) Administered  . Influenza Split 03/31/2012, 04/01/2013, 02/22/2014  . Influenza Whole 03/05/2011  . Influenza, High Dose Seasonal PF 02/20/2016, 03/14/2017  . Influenza,inj,Quad PF,6+ Mos 02/21/2015  . Pneumococcal Conjugate-13 08/02/2013  . Pneumococcal Polysaccharide-23 04/09/1999, 04/09/2011  . Tdap 09/19/2008  . Zoster 01/20/2007    Screening Tests Health Maintenance  Topic Date Due  . INFLUENZA VACCINE  01/01/2018  . TETANUS/TDAP  09/20/2018  . DEXA SCAN  Completed  . PNA vac Low Risk Adult  Completed      Plan:    End of life planning; Advance aging; Advanced directives discussed. Copy of current HCPOA/Living Will requested.    I have personally reviewed and noted the following in the patient's chart:   . Medical and social history . Use of alcohol, tobacco or illicit drugs  . Current medications and supplements . Functional ability and status . Nutritional status . Physical activity . Advanced directives . List of other physicians . Hospitalizations, surgeries, and ER visits in previous 12 months . Vitals . Screenings to include cognitive, depression, and falls . Referrals and appointments  In addition, I have reviewed and discussed with patient certain preventive protocols,  quality metrics, and best practice recommendations. A  written personalized care plan for preventive services as well as general preventive health recommendations were provided to patient.     Varney Biles, LPN  7/47/1595   Reviewed above information.  Agree with assessment and plan.    Dr Nicki Reaper

## 2017-09-11 NOTE — Patient Instructions (Addendum)
  Ana Cruz , Thank you for taking time to come for your Medicare Wellness Visit. I appreciate your ongoing commitment to your health goals. Please review the following plan we discussed and let me know if I can assist you in the future.   These are the goals we discussed: Goals    . DIET - INCREASE WATER INTAKE       This is a list of the screening recommended for you and due dates:  Health Maintenance  Topic Date Due  . Flu Shot  01/01/2018  . Tetanus Vaccine  09/20/2018  . DEXA scan (bone density measurement)  Completed  . Pneumonia vaccines  Completed

## 2017-09-12 ENCOUNTER — Telehealth: Payer: Self-pay | Admitting: Internal Medicine

## 2017-09-12 NOTE — Telephone Encounter (Signed)
Copied from [place]. Topic: General - Other >> Sep 12, 2017  1:03 PM Margot Ables wrote: Reason for CRM: pt wanting to check status of handicap placard form that was brought in yesterday. Please call to notify when ready for pick up. Pt wanting to pick up today.

## 2017-09-16 NOTE — Telephone Encounter (Signed)
Spoke with pt and she stated that she came by this morning and picked the form up.

## 2017-09-22 ENCOUNTER — Ambulatory Visit: Payer: Medicare Other | Admitting: Podiatry

## 2017-09-25 ENCOUNTER — Encounter: Payer: Self-pay | Admitting: Podiatry

## 2017-09-25 ENCOUNTER — Ambulatory Visit (INDEPENDENT_AMBULATORY_CARE_PROVIDER_SITE_OTHER): Payer: Medicare Other | Admitting: Podiatry

## 2017-09-25 DIAGNOSIS — B351 Tinea unguium: Secondary | ICD-10-CM

## 2017-09-25 DIAGNOSIS — M79676 Pain in unspecified toe(s): Secondary | ICD-10-CM

## 2017-09-25 NOTE — Progress Notes (Signed)
Complaint:  Visit Type: Patient returns to my office for continued preventative foot care services. Complaint: Patient states" my nails have grown long and thick and become painful to walk and wear shoes" . The patient presents for preventative foot care services. No changes to ROS  Podiatric Exam: Vascular: dorsalis pedis and posterior tibial pulses are palpable bilateral. Capillary return is immediate. Temperature gradient is WNL. Skin turgor WNL  Sensorium: Normal Semmes Weinstein monofilament test. Normal tactile sensation bilaterally. Nail Exam: Pt has thick disfigured discolored nails with subungual debris noted bilateral entire nail hallux through fifth toenails Ulcer Exam: There is no evidence of ulcer or pre-ulcerative changes or infection. Orthopedic Exam: Muscle tone and strength are WNL. No limitations in general ROM. No crepitus or effusions noted. Foot type and digits show no abnormalities. HAV  B/L.  Hammer toe B/L. Skin:  . No infection or ulcers.  Peeling sub 2 right foot.  Diagnosis:  Onychomycosis, , Pain in right toe, pain in left toes   Treatment & Plan Procedures and Treatment: Consent by patient was obtained for treatment procedures.   Debridement of mycotic and hypertrophic toenails, 1 through 5 bilateral and clearing of subungual debris. No ulceration, no infection noted.  Return Visit-Office Procedure: Patient instructed to return to the office for a follow up visit 12 weeks  for continued evaluation and treatment.    Gardiner Barefoot DPM

## 2017-10-13 ENCOUNTER — Ambulatory Visit: Payer: Medicare Other | Admitting: Podiatry

## 2017-11-07 ENCOUNTER — Other Ambulatory Visit: Payer: Self-pay | Admitting: Internal Medicine

## 2017-12-03 ENCOUNTER — Other Ambulatory Visit (INDEPENDENT_AMBULATORY_CARE_PROVIDER_SITE_OTHER): Payer: Medicare Other

## 2017-12-03 DIAGNOSIS — I10 Essential (primary) hypertension: Secondary | ICD-10-CM | POA: Diagnosis not present

## 2017-12-03 LAB — BASIC METABOLIC PANEL
BUN: 32 mg/dL — ABNORMAL HIGH (ref 6–23)
CALCIUM: 9.4 mg/dL (ref 8.4–10.5)
CO2: 27 meq/L (ref 19–32)
Chloride: 104 mEq/L (ref 96–112)
Creatinine, Ser: 1.34 mg/dL — ABNORMAL HIGH (ref 0.40–1.20)
GFR: 40.21 mL/min — ABNORMAL LOW (ref >60.00)
Glucose, Bld: 99 mg/dL (ref 70–99)
Potassium: 4.6 mEq/L (ref 3.5–5.1)
SODIUM: 139 meq/L (ref 135–145)

## 2017-12-04 ENCOUNTER — Encounter: Payer: Self-pay | Admitting: Emergency Medicine

## 2017-12-04 ENCOUNTER — Emergency Department: Payer: Medicare Other

## 2017-12-04 ENCOUNTER — Other Ambulatory Visit: Payer: Self-pay

## 2017-12-04 ENCOUNTER — Emergency Department
Admission: EM | Admit: 2017-12-04 | Discharge: 2017-12-04 | Disposition: A | Discharge status: Home / Self Care | Payer: Medicare Other | Attending: Emergency Medicine | Admitting: Emergency Medicine

## 2017-12-04 DIAGNOSIS — Z79899 Other long term (current) drug therapy: Secondary | ICD-10-CM | POA: Insufficient documentation

## 2017-12-04 DIAGNOSIS — E860 Dehydration: Secondary | ICD-10-CM

## 2017-12-04 DIAGNOSIS — R079 Chest pain, unspecified: Secondary | ICD-10-CM | POA: Diagnosis present

## 2017-12-04 DIAGNOSIS — R42 Dizziness and giddiness: Secondary | ICD-10-CM | POA: Diagnosis not present

## 2017-12-04 DIAGNOSIS — N181 Chronic kidney disease, stage 1: Secondary | ICD-10-CM | POA: Insufficient documentation

## 2017-12-04 DIAGNOSIS — Z7901 Long term (current) use of anticoagulants: Secondary | ICD-10-CM | POA: Insufficient documentation

## 2017-12-04 DIAGNOSIS — I129 Hypertensive chronic kidney disease with stage 1 through stage 4 chronic kidney disease, or unspecified chronic kidney disease: Secondary | ICD-10-CM | POA: Insufficient documentation

## 2017-12-04 DIAGNOSIS — E039 Hypothyroidism, unspecified: Secondary | ICD-10-CM | POA: Diagnosis not present

## 2017-12-04 LAB — BASIC METABOLIC PANEL
ANION GAP: 8 (ref 5–15)
BUN: 41 mg/dL — ABNORMAL HIGH (ref 8–23)
CHLORIDE: 106 mmol/L (ref 98–111)
CO2: 25 mmol/L (ref 22–32)
Calcium: 9.1 mg/dL (ref 8.9–10.3)
Creatinine, Ser: 1.46 mg/dL — ABNORMAL HIGH (ref 0.44–1.00)
GFR calc Af Amer: 37 mL/min — ABNORMAL LOW (ref >60)
GFR calc non Af Amer: 32 mL/min — ABNORMAL LOW (ref >60)
Glucose, Bld: 139 mg/dL — ABNORMAL HIGH (ref 70–99)
Potassium: 4.6 mmol/L (ref 3.5–5.1)
Sodium: 139 mmol/L (ref 135–145)

## 2017-12-04 LAB — CBC
HCT: 35.6 % (ref 35.0–47.0)
HEMOGLOBIN: 11.7 g/dL — AB (ref 12.0–16.0)
MCH: 31.5 pg (ref 26.0–34.0)
MCHC: 33 g/dL (ref 32.0–36.0)
MCV: 95.4 fL (ref 80.0–100.0)
Platelets: 283 10*3/uL (ref 150–440)
RBC: 3.73 MIL/uL — ABNORMAL LOW (ref 3.80–5.20)
RDW: 14.5 % (ref 11.5–14.5)
WBC: 6.9 10*3/uL (ref 3.6–11.0)

## 2017-12-04 LAB — URINALYSIS, COMPLETE (UACMP) WITH MICROSCOPIC
BILIRUBIN URINE: NEGATIVE
Bacteria, UA: NONE SEEN
Glucose, UA: NEGATIVE mg/dL
HGB URINE DIPSTICK: NEGATIVE
Ketones, ur: NEGATIVE mg/dL
Leukocytes, UA: NEGATIVE
NITRITE: NEGATIVE
PH: 5 (ref 5.0–8.0)
Protein, ur: NEGATIVE mg/dL
SPECIFIC GRAVITY, URINE: 1.013 (ref 1.005–1.030)

## 2017-12-04 LAB — TROPONIN I

## 2017-12-04 MED ORDER — LORAZEPAM 2 MG/ML IJ SOLN
0.5000 mg | Freq: Once | INTRAMUSCULAR | Status: AC
  Administered 2017-12-04: 0.5 mg via INTRAVENOUS
  Filled 2017-12-04: qty 1

## 2017-12-04 MED ORDER — SODIUM CHLORIDE 0.9 % IV BOLUS
500.0000 mL | Freq: Once | INTRAVENOUS | Status: AC
  Administered 2017-12-04: 500 mL via INTRAVENOUS

## 2017-12-04 MED ORDER — LORAZEPAM 2 MG/ML IJ SOLN: 0.5000 mg | Freq: Once | INTRAMUSCULAR | Status: DC

## 2017-12-04 NOTE — ED Triage Notes (Signed)
Pt comes into the ED via POV c/o chest pain and dizziness that started Tuesday.  Patient states she went to the doctor on Tuesday for being lightheaded and not seeing "whole words".  Patient states that her chest pain is tightness around the chest. Patient denies any shortness of breath, N/V.  Patient has had decreased appetite and has been relying on her husband at times due to her balance.

## 2017-12-04 NOTE — ED Notes (Signed)
Pt taken to POV in [place]. Husband was DD. VSS. NAD. Discharge instructions, RX and follow up reviewed. All questions answered.

## 2017-12-04 NOTE — ED Notes (Signed)
Pt assisted to bathroom at this time.

## 2017-12-04 NOTE — ED Provider Notes (Signed)
[facility] Emergency Department Provider Note   ____________________________________________   First MD Initiated Contact with Patient 12/04/17 [phone]     (approximate)  I have reviewed the triage vital signs and the nursing notes.   HISTORY  Chief Complaint Chest Pain and Dizziness    HPI Ana Cruz is a 84 y.o. female history of atrial fibrillation and chronic kidney disease  Patient presents for evaluation reports that she is been feeling some occasional dizziness for about 3 days now.  On Monday night she began to feel lightheaded at times, feels a slight spinning sensation especially when she stands up or moves.  At one point when she was reading a book she looked at her husband and started feeling lightheaded as though words on a paper were not clear.  Then she also developed a feeling of tightness in her chest, she was describes it as a feeling like her bra was on too tight and she loosened it and not seem to help.  She is continued over the last couple of days to feel intermittently lightheaded, feels like things are sometimes spinning on her.  No ear pain.  No double vision, but reports the right eye seemed off, she saw ophthalmology for this on Tuesday and reports they did her eye exam and reassured her that nothing seemed to have abnormal at the time.  She continues to report that intermittently she feels like she has a fuzziness feeling with vision, slight dizziness.  Not currently having any chest pain and denies feeling short of breath.  No fevers or chills.  No trouble breathing.  Reports she is eating and drinking well.  She is been drinking a lot of water.  She does report that 2 of her sons are undergoing medical problems right now and she is been under a lot of stress recently, she thinks this might be part of it but not certain.  No speech changes.  No numbness or weakness in arm or leg.  No facial droop.  No headache    Past  Medical History:  Diagnosis Date  . allergic rhinitis   . Atrial fibrillation ([place])   . Chronic kidney disease, stage I    mild, did improve with cessation of NSAIDs  . Elbow fracture, left    repaired Jan 2000  . Hyperlipidemia   . Hypertension   . hypothyroidism    medicated since age 53, no prior surgery  . Spinal stenosis    has L5 disk herniation with nerve root displacement    Patient Active Problem List   Diagnosis Date Noted  . Adverse drug reaction 03/17/2017  . Lymphedema 12/16/2016  . Chronic venous insufficiency 12/16/2016  . History of food anaphylaxis 08/22/2015  . Chronic right shoulder pain 08/21/2015  . Bilateral sacral insufficiency fracture with routine healing 08/21/2015  . Spinal stenosis of lumbar region with radiculopathy 05/11/2014  . Obesity 08/02/2013  . Encounter for preventive health examination 08/02/2013  . Screening for colon cancer 01/19/2013  . Hypothyroidism 01/19/2013  . Drug allergy, antibiotic 01/19/2013  . Medicare annual wellness visit, subsequent 07/28/2012  . Calcific tendonitis of right shoulder 04/09/2012  . Cataract extraction status of eye 10/07/2011  . Osteopenia 05/22/2011  . Spinal stenosis of lumbar region 04/10/2011  . Chronic kidney disease, stage I   . Hypertension   . SVT (supraventricular tachycardia) ([place])   . Hyperlipidemia   . Screening for breast cancer 04/09/2011    Past Surgical History:  Procedure Laterality Date  . ABDOMINAL HYSTERECTOMY  1979  . APPENDECTOMY  1969  . BILATERAL OOPHORECTOMY     [place]  . BLADDER REPAIR  1980  . OVARIAN CYST REMOVAL  April 1969  . REPLACEMENT TOTAL KNEE BILATERAL  2009  . reverse shoulder surgery  04/05/2016  . ROTATOR CUFF REPAIR  Jan 2000  . ROTATOR CUFF REPAIR  2000    Prior to Admission medications   Medication Sig Start Date End Date Taking? Authorizing Provider  acetaminophen (TYLENOL) 500 MG tablet Take 500 mg by mouth every 6 (six) hours as needed (As  needed for pain).   Yes [provider]  amLODipine (NORVASC) 2.5 MG tablet TAKE 1 TABLET BY MOUTH EVERY DAY 11/07/17  Yes Crecencio Mc, MD  amoxicillin (AMOXIL) 500 MG capsule Take 500 mg by mouth. Take four tablets prior to dental work.   Yes [provider]  Artificial Tear Ointment (EYE LUBRICANT OP) Apply 1 drop to eye 2 (two) times daily.   Yes [provider]  Cholecalciferol (VITAMIN D3) 1000 UNITS CAPS Take 1,000 Units by mouth daily.    Yes [provider]  levothyroxine (SYNTHROID, LEVOTHROID) 88 MCG tablet Take 1 tablet (88 mcg total) by mouth daily before breakfast. 06/10/17  Yes Crecencio Mc, MD  Liniments (ACE PAIN RELIEVING PATCH EX) Apply 1 patch topically daily as needed.   Yes [provider]  pravastatin (PRAVACHOL) 20 MG tablet TAKE 1 TABLET BY MOUTH EVERY DAY 11/07/17  Yes Crecencio Mc, MD  telmisartan (MICARDIS) 40 MG tablet Take 40 mg by mouth daily.   Yes [provider]  warfarin (COUMADIN) 1 MG tablet Take 1 mg by mouth daily.    Yes [provider]    Allergies Chocolate flavor; Clindamycin/lincomycin; Onion; Other; Tape; and Hctz [hydrochlorothiazide]  Family History  Problem Relation Age of Onset  . Hypertension Mother   . Dementia Mother   . Heart disease Father 47       MI  . Cancer Paternal Grandfather 20       colon  . Breast cancer Neg Hx     Social History Social History   Tobacco Use  . Smoking status: Never Smoker  . Smokeless tobacco: Never Used  Substance Use Topics  . Alcohol use: Yes  . Drug use: No    Review of Systems Constitutional: No fever/chills Eyes: See HPI ENT: No sore throat. Cardiovascular: Denies chest pain had tightness in the chest earlier but did not radiate or move.  Reports was a very light feeling and she thought it was for as though her bra was too tight and loosening it seemed to help some. Respiratory: Denies shortness of breath. Gastrointestinal:  No abdominal pain.  No nausea, no vomiting.  No diarrhea.  No constipation. Genitourinary: Negative for dysuria. Musculoskeletal: Negative for back pain. Skin: Negative for rash. Neurological: Negative for headaches, focal weakness or numbness.    ____________________________________________   PHYSICAL EXAM:  VITAL SIGNS: ED Triage Vitals  Enc Vitals Group     BP 12/04/17 1310 (!) 146/58     Pulse Rate 12/04/17 1310 (!) 55     Resp 12/04/17 1310 17     Temp 12/04/17 1310 97.8 F (36.6 C)     Temp Source 12/04/17 1310 Oral     SpO2 12/04/17 1310 98 %     Weight 12/04/17 1308 163 lb (73.9 kg)     Height 12/04/17 1308 5\' 3"  (1.6 m)  Head Circumference --      Peak Flow --      Pain Score 12/04/17 1307 4     Pain Loc --      Pain Edu? --      Excl. in [place]? --     Constitutional: Alert and oriented. Well appearing and in no acute distress.  Patient and her husband both very pleasant. Eyes: Conjunctivae are normal.  Extraocular movements are intact.  She reports her vision is normal at the present time. Head: Atraumatic. Nose: No congestion/rhinnorhea. Mouth/Throat: Mucous membranes are slightly dry. Neck: No stridor.   Cardiovascular: Normal rate, regular rhythm. Grossly normal heart sounds.  Good peripheral circulation. Respiratory: Normal respiratory effort.  No retractions. Lungs CTAB. Gastrointestinal: Soft and nontender. No distention. Musculoskeletal: No lower extremity tenderness nor edema. Neurologic:  Normal speech and language. No gross focal neurologic deficits are appreciated.  5 out of 5 strength in all extremities.  No ataxia.  No pronator drift.  Normal sensation.  Normal cranial nerves.  Clear speech. Skin:  Skin is warm, dry and intact. No rash noted. Psychiatric: Mood and affect are normal. Speech and behavior are normal.  ____________________________________________   LABS (all labs ordered are listed, but only abnormal results are displayed)  Labs  Reviewed  BASIC METABOLIC PANEL - Abnormal; Notable for the following components:      Result Value   Glucose, Bld 139 (*)    BUN 41 (*)    Creatinine, Ser 1.46 (*)    GFR calc non Af Amer 32 (*)    GFR calc Af Amer 37 (*)    All other components within normal limits  CBC - Abnormal; Notable for the following components:   RBC 3.73 (*)    Hemoglobin 11.7 (*)    All other components within normal limits  URINALYSIS, COMPLETE (UACMP) WITH MICROSCOPIC - Abnormal; Notable for the following components:   Color, Urine YELLOW (*)    APPearance CLEAR (*)    All other components within normal limits  TROPONIN I   ____________________________________________  EKG  Reviewed and entered by me at 1305 Heart rate 60 Cures 89 QTC 430 Normal sinus rhythm, no evidence of acute ischemia ____________________________________________  RADIOLOGY  Discussed benefits of MRI, but patient reports severe claustrophobia and can barely tolerate open MRI.  We discussed risks and benefits, and the patient is agreeable to attempting CT scan but reports she cannot tolerate MRI even with providing some Ativan she reports she can only do it at an open MRI and patient not interested in transfer for such.  Proceed with CT scan to evaluate for gross abnormality.  Low likelihood of central neurologic process pretest. ____________________________________________   PROCEDURES  Procedure(s) performed: None  Procedures  Critical Care performed: No  ____________________________________________   INITIAL IMPRESSION / ASSESSMENT AND PLAN / ED COURSE  Pertinent labs & imaging results that were available during my care of the patient were reviewed by me and considered in my medical decision making (see chart for details).  Patient returns for evaluation of intermittent symptoms of visual change, lightheadedness that seems to fit a picture likely of some type of vertigo, the central process is considered.   Patient had a feeling of a tightness in her chest earlier, but reports that that was relieved after loosening her bra, and at the present time her EKG and troponin are reassuring and she has no ongoing chest pain.  No known history of cardiac disease.  Patient reassuring nonfocal  neurologic exam.  No deficits noted.  Stable hemodynamics, reports feeling lightheaded off and on.  Seen by ophthalmology when she had visual symptoms and reports that she had a examination with the physician of the eye which was normal.  Not having any ongoing visual deficit now.  We will check labs including CBC, troponin, basic metabolic panel.  ----------------------------------------- 5:34 PM on 12/04/2017 ----------------------------------------- Creatinine is slightly elevated, history history of chronic renal disease.  SPECT some to slight worsening of this along with mild dehydration.  Hemodynamically stable, will provide hydration, check urinalysis, and CT scan.  Had originally recommended MRI but patient's severe claustrophobia limits disability.  Shared medical decision-making with regard to ordering CT versus MRI.     Clinical Course as of Dec 04 1809  Thu Dec 04, 2017  1754 Patient resting comfortably, reports she is feeling somewhat better after IV fluids.  No ongoing lightheaded feeling.  Denies any chest tightness.  Has a doctor's appointment for follow-up already set up for Monday.   [MQ]    Clinical Course User Index [MQ] Delman Kitten, MD     ____________________________________________   FINAL CLINICAL IMPRESSION(S) / ED DIAGNOSES  Final diagnoses:  Dizziness  Mild dehydration      NEW MEDICATIONS STARTED DURING THIS VISIT:  New Prescriptions   No medications on file     Note:  This document was prepared using Dragon voice recognition software and may include unintentional dictation errors.     Delman Kitten, MD 12/04/17 [id number]

## 2017-12-08 ENCOUNTER — Ambulatory Visit (INDEPENDENT_AMBULATORY_CARE_PROVIDER_SITE_OTHER): Payer: Medicare Other | Admitting: Internal Medicine

## 2017-12-08 ENCOUNTER — Encounter: Payer: Self-pay | Admitting: Internal Medicine

## 2017-12-08 VITALS — BP 144/66 | HR 54 | Temp 97.9°F | Resp 16 | Ht 63.0 in | Wt 169.4 lb

## 2017-12-08 DIAGNOSIS — I480 Paroxysmal atrial fibrillation: Secondary | ICD-10-CM

## 2017-12-08 DIAGNOSIS — R42 Dizziness and giddiness: Secondary | ICD-10-CM

## 2017-12-08 DIAGNOSIS — I10 Essential (primary) hypertension: Secondary | ICD-10-CM

## 2017-12-08 DIAGNOSIS — N183 Chronic kidney disease, stage 3 unspecified: Secondary | ICD-10-CM

## 2017-12-08 DIAGNOSIS — E034 Atrophy of thyroid (acquired): Secondary | ICD-10-CM | POA: Diagnosis not present

## 2017-12-08 DIAGNOSIS — I130 Hypertensive heart and chronic kidney disease with heart failure and stage 1 through stage 4 chronic kidney disease, or unspecified chronic kidney disease: Secondary | ICD-10-CM | POA: Diagnosis not present

## 2017-12-08 LAB — COMPREHENSIVE METABOLIC PANEL
ALBUMIN: 3.6 g/dL (ref 3.5–5.2)
ALT: 9 U/L (ref 0–35)
AST: 15 U/L (ref 0–37)
Alkaline Phosphatase: 70 U/L (ref 39–117)
BILIRUBIN TOTAL: 0.3 mg/dL (ref 0.2–1.2)
BUN: 35 mg/dL — AB (ref 6–23)
CHLORIDE: 104 meq/L (ref 96–112)
CO2: 27 meq/L (ref 19–32)
CREATININE: 1.39 mg/dL — AB (ref 0.40–1.20)
Calcium: 9.1 mg/dL (ref 8.4–10.5)
GFR: 38.54 mL/min — ABNORMAL LOW (ref >60.00)
Glucose, Bld: 96 mg/dL (ref 70–99)
Potassium: 4.7 mEq/L (ref 3.5–5.1)
SODIUM: 138 meq/L (ref 135–145)
Total Protein: 7 g/dL (ref 6.0–8.3)

## 2017-12-08 LAB — TSH: TSH: 1.47 u[IU]/mL (ref 0.35–4.50)

## 2017-12-08 MED ORDER — AMOXICILLIN-POT CLAVULANATE 875-125 MG PO TABS
1.0000 | ORAL_TABLET | Freq: Two times a day (BID) | ORAL | 0 refills | Status: DC
Start: 2017-12-08 — End: 2017-12-17

## 2017-12-08 MED ORDER — AMLODIPINE BESYLATE 5 MG PO TABS: 5.0000 mg | ORAL_TABLET | Freq: Every day | ORAL | 0 refills | Status: DC

## 2017-12-08 MED ORDER — PREDNISONE 10 MG PO TABS: ORAL_TABLET | ORAL | 0 refills | Status: DC

## 2017-12-08 NOTE — Patient Instructions (Addendum)
Increase your amlodipine dose to 5 mg daily   New rx is on file at [facility].    Your dizziness may be from your left sinus infection  I am treating you for sinusitis today:    I am prescribing an antibiotic (augmentin) and a prednisone taper  To manage the infection and the inflammation in your ear/sinuses.   I also advise use of the following OTC meds to help with your other symptoms.    Sudafed PE  10 to 30 mg every 8 hours for the congestion, you may substitute Afrin nasal spray  Once or twice daily if you do not tolerate the sudafed PE  If needed to prevent insomnia.  flush your sinuses once daily with Milta [facility]'s sinus rinse (do over the sink because if you do it right you will spit out globs of mucus)   Ir no improvement in the dizziness or the sinus pain in one week,   Call for ENT referral    Referral to kidney doctor

## 2017-12-08 NOTE — Progress Notes (Signed)
Subjective:  Patient ID: Ana Cruz, female    DOB: 1933/11/20  Age: 84 y.o. MRN: [id number]  CC: The primary encounter diagnosis was Benign hypertensive heart and CKD, stage 3 (GFR 30-59), w CHF ([place]). Diagnoses of CKD (chronic kidney disease) stage 3, GFR 30-59 ml/min (HCC), Hypothyroidism due to acquired atrophy of thyroid, Essential hypertension, Paroxysmal atrial fibrillation ([place]), and Vertigo were also pertinent to this visit.  HPI Ana Cruz presents for follow up on multiple issues .    1) Treated in ER on  July 4 for dizziness with vertigo and vision changes that started  3 days prior to presentation. Had seen her optometrist on July 2 and told that her visions changes were of unclear etiology.  sympotms became disabling and she became unable to walk on July 4 .   ER visit reviewed with patient today:   EKG was NSR x 2.   labs done: CBC, UA were normal . CR was 1.46,  1.34 one day prior, troponin negative. . . MRi brain advised to rule out CVA but was deferred by patient, CT head done and noted chronic  inflammatory changes involving the left frontal and ethmoid sinus opacification.  Chest x ray noted increased interstitial   markings  .  She was treated for dehydration with IV fluids and told to drink more water. .    Feeling better today,  Has been drinking more water,  Max 40 ounces daily . Still has persistent feeling of pressure over left frontal sinus and left forehead.  Has had sinus drainage that is not clear.  No recent treatment for sinusitis.      2) Anxiety:  Feeling emotionally stressed out by family's  health issues   Son has been diagnosed with brain CA  And undergoing treatment  Older son has cellulitis and DVT   Getting  IV treatment daily  Husband Ana Cruz having back pain .  Getting ESI     3) follow up on hypertension: BP has been labile at home.  Checking once daily. Readings reviewed or the past month and systolics range from 944 to 145.   Taking amlodipine 5 mg and telmisartan 40 mg daily. hctz caused a rash     4) hyperlipidemia:  Tolerating pravastatin   5) Paroxysmal Atrial fibrillation:  Rate controlled without meds,  Anticoagulation Managed by Nehemiah Massed with coumadin  Dose is 1 mg daily.  Last , INR was 2.7 on July 1 per review of notes in Epic portal   5) Still seeing  Ortho for persistent right shoulder pain  Despite surgery  ROM remains restricted to shoulder height.   Outpatient Medications Prior to Visit  Medication Sig Dispense Refill  . acetaminophen (TYLENOL) 500 MG tablet Take 500 mg by mouth every 6 (six) hours as needed (As needed for pain).    [name] amoxicillin (AMOXIL) 500 MG capsule Take 500 mg by mouth. Take four tablets prior to dental work.    . Artificial Tear Ointment (EYE LUBRICANT OP) Apply 1 drop to eye 2 (two) times daily.    [name] BIOTIN PO Take 1 capsule by mouth daily.    . Cholecalciferol (VITAMIN D3) 1000 UNITS CAPS Take 1,000 Units by mouth daily.     [name] levothyroxine (SYNTHROID, LEVOTHROID) 88 MCG tablet Take 1 tablet (88 mcg total) by mouth daily before breakfast. 90 tablet 1  . Liniments (ACE PAIN RELIEVING PATCH EX) Apply 1 patch topically daily as needed.    . pravastatin (  PRAVACHOL) 20 MG tablet TAKE 1 TABLET BY MOUTH EVERY DAY 90 tablet 1  . telmisartan (MICARDIS) 40 MG tablet Take 40 mg by mouth daily.    [name] warfarin (COUMADIN) 1 MG tablet Take 1 mg by mouth daily.     [name] amLODipine (NORVASC) 2.5 MG tablet TAKE 1 TABLET BY MOUTH EVERY DAY 90 tablet 0   No facility-administered medications prior to visit.     Review of Systems;  Patient denies headache, fevers, malaise, unintentional weight loss, skin rash, eye pain, sinus congestion and sinus pain, sore throat, dysphagia,  hemoptysis , cough, dyspnea, wheezing, chest pain, palpitations, orthopnea, edema, abdominal pain, nausea, melena, diarrhea, constipation, flank pain, dysuria, hematuria, urinary  Frequency, nocturia, numbness, tingling,  seizures,  Focal weakness, Loss of consciousness,  Tremor, insomnia, depression, anxiety, and suicidal ideation.      Objective:  BP (!) 144/66 (BP Location: Left Arm, Patient Position: Sitting, Cuff Size: Normal)   Pulse (!) 54   Temp 97.9 F (36.6 C) (Oral)   Resp 16   Ht 5\' 3"  (1.6 m)   Wt 169 lb 6.4 oz (76.8 kg)   SpO2 97%   BMI 30.01 kg/m   BP Readings from Last 3 Encounters:  12/08/17 (!) 144/66  12/04/17 (!) 142/62  09/11/17 134/62    Wt Readings from Last 3 Encounters:  12/08/17 169 lb 6.4 oz (76.8 kg)  12/04/17 163 lb (73.9 kg)  09/11/17 170 lb 12.8 oz (77.5 kg)    General appearance: alert, cooperative and appears stated age Ears: normal TM's and external ear canals both ears Throat: lips, mucosa, and tongue normal; teeth and gums normal Neck: no adenopathy, no carotid bruit, supple, symmetrical, trachea midline and thyroid not enlarged, symmetric, no tenderness/mass/nodules Back: symmetric, no curvature. ROM normal. No CVA tenderness. Lungs: clear to auscultation bilaterally Heart: regular rate and rhythm, S1, S2 normal, no murmur, click, rub or gallop Abdomen: soft, non-tender; bowel sounds normal; no masses,  no organomegaly Pulses: 2+ and symmetric Skin: Skin color, texture, turgor normal. No rashes or lesions Lymph nodes: Cervical, supraclavicular, and axillary nodes normal.  Lab Results  Component Value Date   HGBA1C 5.9 08/20/2016   HGBA1C 6.1 02/20/2016    Lab Results  Component Value Date   CREATININE 1.39 (H) 12/08/2017   CREATININE 1.46 (H) 12/04/2017   CREATININE 1.34 (H) 12/03/2017    Lab Results  Component Value Date   WBC 6.9 12/04/2017   HGB 11.7 (L) 12/04/2017   HCT 35.6 12/04/2017   PLT 283 12/04/2017   GLUCOSE 96 12/08/2017   CHOL 198 08/21/2015   TRIG 81.0 08/21/2015   HDL 71.90 08/21/2015   LDLDIRECT 107.0 03/14/2017   LDLCALC 110 (H) 08/21/2015   ALT 9 12/08/2017   AST 15 12/08/2017   NA 138 12/08/2017   K 4.7  12/08/2017   CL 104 12/08/2017   CREATININE 1.39 (H) 12/08/2017   BUN 35 (H) 12/08/2017   CO2 27 12/08/2017   TSH 1.47 12/08/2017   HGBA1C 5.9 08/20/2016    Dg Chest 2 View  Result Date: 12/04/2017 CLINICAL DATA:  Chest pain and dizziness EXAM: CHEST - 2 VIEW COMPARISON:  05/26/2012 FINDINGS: Cardiac shadow is stable. The lungs are well aerated bilaterally. Mild interstitial changes are seen without focal confluent infiltrate. Postsurgical changes in the right shoulder are seen. No acute bony abnormality is noted. IMPRESSION: Increased interstitial markings bilaterally which may be related to some mild bronchitis. No focal confluent infiltrate is seen. Electronically Signed  By: Inez Catalina M.D.   On: 12/04/2017 13:36   Ct Head Wo Contrast  Result Date: 12/04/2017 CLINICAL DATA:  Chest pain and dizziness beginning 2 days ago. EXAM: CT HEAD WITHOUT CONTRAST TECHNIQUE: Contiguous axial images were obtained from the base of the skull through the vertex without intravenous contrast. COMPARISON:  None. FINDINGS: Brain: Ventricles, cisterns and other CSF spaces are within normal. No mass, mass effect, shift of midline structures or acute hemorrhage. No evidence of acute infarction. Possible small old infarct versus prominent CSF space over the right cerebellar hemisphere. Vascular: No hyperdense vessel or unexpected calcification. Skull: Normal. Negative for fracture or focal lesion. Sinuses/Orbits: Opacification over the left frontal and ethmoid sinus with minimal mucosal membrane thickening over the left sphenoid sinus. Mastoid air cells are clear. Orbits are normal. Other: None. IMPRESSION: No acute findings. Mild left-sided chronic sinus inflammatory change. Electronically Signed   By: Marin Olp M.D.   On: 12/04/2017 16:26    Assessment & Plan:   Problem List Items Addressed This Visit    Hypothyroidism   Relevant Orders   TSH (Completed)   CKD (chronic kidney disease) stage 3, GFR 30-59  ml/min (HCC)    GFR  Slightly improved with treatment of dehydration.  Nephrology referral in progress .  Continue telmisartan, pravastatin. Avoid NSAIDs. Gaol 60 iunces waer or other non caffeinated beverages daily   Lab Results  Component Value Date   CREATININE 1.39 (H) 12/08/2017   No results found for: MICROALBUR, [id number]       Hypertension    Not at goal .  Increase amlodipine to 5 mg daily       Relevant Medications   amLODipine (NORVASC) 5 MG tablet   Paroxysmal atrial fibrillation ([place])    She has not required rate controlling medications in years.  She is anticoagulated with warfarin , current dose is 1 mg daily,  INR managed y [facility]/Cardiology , and her last INR was 2.7 on July 1       Relevant Medications   amLODipine (NORVASC) 5 MG tablet   Vertigo    Improved since presentation to ER on July 4.  DDX included stroke,  But she is anticoagulated and recent INR was therapeutic .  She declined MRI.  CT showed chronic inflammatory changes of left frontal and ethmoid sinuses.  Will treat sinusitis noted on head CT with empiric augmentin, prednisone taper. If symptoms persist,  ENT referral needed.        Other Visit Diagnoses    Benign hypertensive heart and CKD, stage 3 (GFR 30-59), w CHF ([place])    -  Primary   Relevant Medications   amLODipine (NORVASC) 5 MG tablet   Other Relevant Orders   Ambulatory referral to Nephrology   Comprehensive metabolic panel (Completed)    A total of 40 minutes was spent with patient more than half of which was spent in counseling patient on the above mentioned issues , reviewing and explaining recent labs and imaging studies done, and coordination of care.   I have changed Marcie Bal L. Loewen's amLODipine. I am also having her start on predniSONE and amoxicillin-clavulanate. Additionally, I am having her maintain her Vitamin D3, amoxicillin, warfarin, acetaminophen, Artificial Tear Ointment (EYE LUBRICANT OP), telmisartan,  levothyroxine, Liniments (ACE PAIN RELIEVING PATCH EX), pravastatin, and BIOTIN PO.  Meds ordered this encounter  Medications  . amLODipine (NORVASC) 5 MG tablet    Sig: Take 1 tablet (5 mg total) by mouth daily.  Dispense:  90 tablet    Refill:  0    DO NOT FILL NOW KEEP ON FILE FOR NEXT REFILL I ABOUT 30 DAYS ,  NOTE DOSE INCREASE  . predniSONE (DELTASONE) 10 MG tablet    Sig: 6 tablets on Day 1 , then reduce by 1 tablet daily until gone    Dispense:  21 tablet    Refill:  0  . amoxicillin-clavulanate (AUGMENTIN) 875-125 MG tablet    Sig: Take 1 tablet by mouth 2 (two) times daily.    Dispense:  14 tablet    Refill:  0    Medications Discontinued During This Encounter  Medication Reason  . amLODipine (NORVASC) 2.5 MG tablet     Follow-up: No follow-ups on file.   Crecencio Mc, MD

## 2017-12-09 DIAGNOSIS — R42 Dizziness and giddiness: Secondary | ICD-10-CM | POA: Insufficient documentation

## 2017-12-09 NOTE — Assessment & Plan Note (Addendum)
GFR  Slightly improved with treatment of dehydration.  Nephrology referral in progress .  Continue telmisartan, pravastatin. Avoid NSAIDs. Gaol 60 iunces waer or other non caffeinated beverages daily   Lab Results  Component Value Date   CREATININE 1.39 (H) 12/08/2017   No results found for: Derl Barrow

## 2017-12-09 NOTE — Assessment & Plan Note (Signed)
Improved since presentation to ER on July 4.  DDX included stroke,  But she is anticoagulated and recent INR was therapeutic .  She declined MRI.  CT showed chronic inflammatory changes of left frontal and ethmoid sinuses.  Will treat sinusitis noted on head CT with empiric augmentin, prednisone taper. If symptoms persist,  ENT referral needed.

## 2017-12-09 NOTE — Assessment & Plan Note (Signed)
She has not required rate controlling medications in years.  She is anticoagulated with warfarin , current dose is 1 mg daily,  INR managed y [facility]/Cardiology , and her last INR was 2.7 on July 1

## 2017-12-09 NOTE — Assessment & Plan Note (Signed)
Not at goal .  Increase amlodipine to 5 mg daily

## 2017-12-15 ENCOUNTER — Encounter: Payer: Self-pay | Admitting: Podiatry

## 2017-12-15 ENCOUNTER — Ambulatory Visit (INDEPENDENT_AMBULATORY_CARE_PROVIDER_SITE_OTHER): Payer: Medicare Other | Admitting: Podiatry

## 2017-12-15 DIAGNOSIS — M79676 Pain in unspecified toe(s): Secondary | ICD-10-CM

## 2017-12-15 DIAGNOSIS — B351 Tinea unguium: Secondary | ICD-10-CM | POA: Diagnosis not present

## 2017-12-15 DIAGNOSIS — L608 Other nail disorders: Secondary | ICD-10-CM

## 2017-12-15 NOTE — Progress Notes (Signed)
Complaint:  Visit Type: Patient returns to my office for continued preventative foot care services. Complaint: Patient states" my nails have grown long and thick and become painful to walk and wear shoes" . The patient presents for preventative foot care services. No changes to ROS  Podiatric Exam: Vascular: dorsalis pedis and posterior tibial pulses are palpable bilateral. Capillary return is immediate. Temperature gradient is WNL. Skin turgor WNL  Sensorium: Normal Semmes Weinstein monofilament test. Normal tactile sensation bilaterally. Nail Exam: Pt has thick disfigured discolored nails with subungual debris noted bilateral entire nail hallux through fifth toenails.  Pincer toenail right hallux. Ulcer Exam: There is no evidence of ulcer or pre-ulcerative changes or infection. Orthopedic Exam: Muscle tone and strength are WNL. No limitations in general ROM. No crepitus or effusions noted. Foot type and digits show no abnormalities. HAV  B/L.  Hammer toe B/L. Skin:  . No infection or ulcers.     Diagnosis:  Onychomycosis, , Pain in right toe, pain in left toes   Treatment & Plan Procedures and Treatment: Consent by patient was obtained for treatment procedures.   Debridement of mycotic and hypertrophic toenails, 1 through 5 bilateral and clearing of subungual debris. No ulceration, no infection noted.  Return Visit-Office Procedure: Patient instructed to return to the office for a follow up visit 12 weeks  for continued evaluation and treatment.    Gardiner Barefoot DPM

## 2017-12-17 ENCOUNTER — Ambulatory Visit (INDEPENDENT_AMBULATORY_CARE_PROVIDER_SITE_OTHER): Payer: Medicare Other | Admitting: Internal Medicine

## 2017-12-17 ENCOUNTER — Encounter: Payer: Self-pay | Admitting: Internal Medicine

## 2017-12-17 VITALS — BP 146/60 | HR 55 | Temp 97.9°F | Resp 14 | Ht 63.0 in | Wt 170.6 lb

## 2017-12-17 DIAGNOSIS — R42 Dizziness and giddiness: Secondary | ICD-10-CM | POA: Diagnosis not present

## 2017-12-17 DIAGNOSIS — I10 Essential (primary) hypertension: Secondary | ICD-10-CM

## 2017-12-17 MED ORDER — PREDNISONE 10 MG PO TABS: ORAL_TABLET | ORAL | 0 refills | Status: DC

## 2017-12-17 MED ORDER — MECLIZINE HCL 25 MG PO TABS: 25.0000 mg | ORAL_TABLET | Freq: Three times a day (TID) | ORAL | 0 refills | Status: DC | PRN

## 2017-12-17 NOTE — Progress Notes (Signed)
Subjective:  Patient ID: Ana Cruz, female    DOB: 24-Feb-1934  Age: 84 y.o. MRN: [id number]  CC: The primary encounter diagnosis was Vertigo. A diagnosis of Essential hypertension was also pertinent to this visit.  HPI [name] presents for one week follow up on sinusitis diagnosed by CT scan during ER visit for dizziness.  I Treated her last week with augmentin, saline sinus rinses  And prednisone taper  She continues to report a "sinus headache"  Across both frontal sinuses, but she has not taken tylenol . She denies purulent nasal drainage. .  Her dizziness has not improved, and she reports that her symptoms make it too difficult to drive andare aggravated by  bend over .   Discussed seeing ENT and she is requesting her husband's provider, [name] ,   Patient is taking her medications as prescribed and notes no adverse effects.  Home BP readings have been done about once per week and are  Usually >140/80 .  She is avoiding added salt in her diet and walking regularly about 3 times per week for exercise  . Seeing nephrology in August for CKD.   bp improving 146 . Taking amlodipine and telmisarta  Outpatient Medications Prior to Visit  Medication Sig Dispense Refill  . acetaminophen (TYLENOL) 500 MG tablet Take 500 mg by mouth every 6 (six) hours as needed (As needed for pain).    [name] amLODipine (NORVASC) 5 MG tablet Take 1 tablet (5 mg total) by mouth daily. 90 tablet 0  . Artificial Tear Ointment (EYE LUBRICANT OP) Apply 1 drop to eye 2 (two) times daily.    [name] BIOTIN PO Take 1 capsule by mouth daily.    . Cholecalciferol (VITAMIN D3) 1000 UNITS CAPS Take 1,000 Units by mouth daily.     [name] levothyroxine (SYNTHROID, LEVOTHROID) 88 MCG tablet Take 1 tablet (88 mcg total) by mouth daily before breakfast. 90 tablet 1  . Liniments (ACE PAIN RELIEVING PATCH EX) Apply 1 patch topically daily as needed.    . pravastatin (PRAVACHOL) 20 MG tablet TAKE 1 TABLET BY MOUTH  EVERY DAY 90 tablet 1  . telmisartan (MICARDIS) 40 MG tablet Take 40 mg by mouth daily.    [name] warfarin (COUMADIN) 1 MG tablet Take 1 mg by mouth daily.     [name] amoxicillin (AMOXIL) 500 MG capsule Take 500 mg by mouth. Take four tablets prior to dental work.    [name] amoxicillin-clavulanate (AUGMENTIN) 875-125 MG tablet Take 1 tablet by mouth 2 (two) times daily. (Patient not taking: Reported on 12/17/2017) 14 tablet 0  . predniSONE (DELTASONE) 10 MG tablet 6 tablets on Day 1 , then reduce by 1 tablet daily until gone (Patient not taking: Reported on 12/17/2017) 21 tablet 0   No facility-administered medications prior to visit.     Review of Systems;  Patient denies headache, fevers, malaise, unintentional weight loss, skin rash, eye pain, sinus congestion and sinus pain, sore throat, dysphagia,  hemoptysis , cough, dyspnea, wheezing, chest pain, palpitations, orthopnea, edema, abdominal pain, nausea, melena, diarrhea, constipation, flank pain, dysuria, hematuria, urinary  Frequency, nocturia, numbness, tingling, seizures,  Focal weakness, Loss of consciousness,  Tremor, insomnia, depression, anxiety, and suicidal ideation.      Objective:  BP (!) 146/60 (BP Location: Left Arm, Patient Position: Sitting, Cuff Size: Normal)   Pulse (!) 55   Temp 97.9 F (36.6 C) (Oral)   Resp 14   Ht 5\' 3"  (1.6 m)  Wt 170 lb 9.6 oz (77.4 kg)   SpO2 98%   BMI 30.22 kg/m   BP Readings from Last 3 Encounters:  12/17/17 (!) 146/60  12/08/17 (!) 144/66  12/04/17 (!) 142/62    Wt Readings from Last 3 Encounters:  12/17/17 170 lb 9.6 oz (77.4 kg)  12/08/17 169 lb 6.4 oz (76.8 kg)  12/04/17 163 lb (73.9 kg)    General appearance: alert, cooperative and appears stated age Ears: normal TM's and external ear canals both ears Throat: lips, mucosa, and tongue normal; teeth and gums normal Neck: no adenopathy, no carotid bruit, supple, symmetrical, trachea midline and thyroid not enlarged, symmetric, no  tenderness/mass/nodules Back: symmetric, no curvature. ROM normal. No CVA tenderness. Lungs: clear to auscultation bilaterally Heart: regular rate and rhythm, S1, S2 normal, no murmur, click, rub or gallop Abdomen: soft, non-tender; bowel sounds normal; no masses,  no organomegaly Pulses: 2+ and symmetric Skin: Skin color, texture, turgor normal. No rashes or lesions Lymph nodes: Cervical, supraclavicular, and axillary nodes normal.  Lab Results  Component Value Date   HGBA1C 5.9 08/20/2016   HGBA1C 6.1 02/20/2016    Lab Results  Component Value Date   CREATININE 1.39 (H) 12/08/2017   CREATININE 1.46 (H) 12/04/2017   CREATININE 1.34 (H) 12/03/2017    Lab Results  Component Value Date   WBC 6.9 12/04/2017   HGB 11.7 (L) 12/04/2017   HCT 35.6 12/04/2017   PLT 283 12/04/2017   GLUCOSE 96 12/08/2017   CHOL 198 08/21/2015   TRIG 81.0 08/21/2015   HDL 71.90 08/21/2015   LDLDIRECT 107.0 03/14/2017   LDLCALC 110 (H) 08/21/2015   ALT 9 12/08/2017   AST 15 12/08/2017   NA 138 12/08/2017   K 4.7 12/08/2017   CL 104 12/08/2017   CREATININE 1.39 (H) 12/08/2017   BUN 35 (H) 12/08/2017   CO2 27 12/08/2017   TSH 1.47 12/08/2017   HGBA1C 5.9 08/20/2016    Dg Chest 2 View  Result Date: 12/04/2017 CLINICAL DATA:  Chest pain and dizziness EXAM: CHEST - 2 VIEW COMPARISON:  05/26/2012 FINDINGS: Cardiac shadow is stable. The lungs are well aerated bilaterally. Mild interstitial changes are seen without focal confluent infiltrate. Postsurgical changes in the right shoulder are seen. No acute bony abnormality is noted. IMPRESSION: Increased interstitial markings bilaterally which may be related to some mild bronchitis. No focal confluent infiltrate is seen. Electronically Signed   By: Inez Catalina M.D.   On: 12/04/2017 13:36   Ct Head Wo Contrast  Result Date: 12/04/2017 CLINICAL DATA:  Chest pain and dizziness beginning 2 days ago. EXAM: CT HEAD WITHOUT CONTRAST TECHNIQUE: Contiguous axial  images were obtained from the base of the skull through the vertex without intravenous contrast. COMPARISON:  None. FINDINGS: Brain: Ventricles, cisterns and other CSF spaces are within normal. No mass, mass effect, shift of midline structures or acute hemorrhage. No evidence of acute infarction. Possible small old infarct versus prominent CSF space over the right cerebellar hemisphere. Vascular: No hyperdense vessel or unexpected calcification. Skull: Normal. Negative for fracture or focal lesion. Sinuses/Orbits: Opacification over the left frontal and ethmoid sinus with minimal mucosal membrane thickening over the left sphenoid sinus. Mastoid air cells are clear. Orbits are normal. Other: None. IMPRESSION: No acute findings. Mild left-sided chronic sinus inflammatory change. Electronically Signed   By: Marin Olp M.D.   On: 12/04/2017 16:26    Assessment & Plan:   Problem List Items Addressed This Visit    Vertigo -  Primary    Her exam is normal.  Will repeat prednisone taper and add meclizine.  ent referral to [facility] in progress      Relevant Orders   Ambulatory referral to ENT   Hypertension    Elevated today,  Likely due to use of prednisone.  Continue telmisartan 40 mg and amlodipine 5 mg  And advised to recheck  1 week after stopping prednisone       A total of 25 minutes of face to face time was spent with patient more than half of which was spent in counselling about the above mentioned conditions  and coordination of care   I have discontinued Marcie Bal L. Degrazia's predniSONE and amoxicillin-clavulanate. I am also having her start on meclizine and predniSONE. Additionally, I am having her maintain her Vitamin D3, amoxicillin, warfarin, acetaminophen, Artificial Tear Ointment (EYE LUBRICANT OP), telmisartan, levothyroxine, Liniments (ACE PAIN RELIEVING PATCH EX), pravastatin, BIOTIN PO, and amLODipine.  Meds ordered this encounter  Medications  . meclizine (ANTIVERT) 25 MG  tablet    Sig: Take 1 tablet (25 mg total) by mouth 3 (three) times daily as needed for dizziness.    Dispense:  30 tablet    Refill:  0  . predniSONE (DELTASONE) 10 MG tablet    Sig: 6 tablets on Day 1 , then reduce by 1 tablet daily until gone    Dispense:  21 tablet    Refill:  0    Medications Discontinued During This Encounter  Medication Reason  . amoxicillin-clavulanate (AUGMENTIN) 875-125 MG tablet Completed Course  . predniSONE (DELTASONE) 10 MG tablet Completed Course    Follow-up: No follow-ups on file.   Crecencio Mc, MD

## 2017-12-17 NOTE — Patient Instructions (Addendum)
I'm sorry you're not feeling better.   You can take up to 2000 mg of tylenol daily in divided  doses for headache/pain   We are repeating a prednisone  taper and adding meclizine (antivert) for the dizziness   Referral to [name], MD [facility])    For the blood pressure":  Continue amlodipine 5 mg and telmisartan  40 mg for now     goal blood pressure is 130/80 .  Check again 1 week after finishing your your new prednisone taper  and send me reading    I'll see you in 6 months,  Sooner if we have to make another medication change

## 2017-12-19 NOTE — Assessment & Plan Note (Signed)
Her exam is normal.  Will repeat prednisone taper and add meclizine.  ent referral to [facility] in progress

## 2017-12-19 NOTE — Assessment & Plan Note (Signed)
Elevated today,  Likely due to use of prednisone.  Continue telmisartan 40 mg and amlodipine 5 mg  And advised to recheck  1 week after stopping prednisone

## 2017-12-23 ENCOUNTER — Other Ambulatory Visit: Payer: Self-pay | Admitting: Otolaryngology

## 2017-12-23 DIAGNOSIS — R42 Dizziness and giddiness: Secondary | ICD-10-CM

## 2017-12-30 ENCOUNTER — Encounter: Payer: Self-pay | Admitting: Internal Medicine

## 2018-01-01 ENCOUNTER — Ambulatory Visit
Admission: RE | Admit: 2018-01-01 | Discharge: 2018-01-01 | Disposition: A | Discharge status: Home / Self Care | Payer: Medicare Other | Source: Ambulatory Visit | Attending: Otolaryngology | Admitting: Otolaryngology

## 2018-01-01 DIAGNOSIS — G319 Degenerative disease of nervous system, unspecified: Secondary | ICD-10-CM | POA: Diagnosis not present

## 2018-01-01 DIAGNOSIS — J329 Chronic sinusitis, unspecified: Secondary | ICD-10-CM | POA: Diagnosis not present

## 2018-01-01 DIAGNOSIS — R42 Dizziness and giddiness: Secondary | ICD-10-CM | POA: Insufficient documentation

## 2018-01-01 DIAGNOSIS — I739 Peripheral vascular disease, unspecified: Secondary | ICD-10-CM | POA: Insufficient documentation

## 2018-01-01 MED ORDER — GADOBENATE DIMEGLUMINE 529 MG/ML IV SOLN
10.0000 mL | Freq: Once | INTRAVENOUS | Status: AC | PRN
  Administered 2018-01-01: 8 mL via INTRAVENOUS

## 2018-01-05 ENCOUNTER — Ambulatory Visit: Payer: Self-pay

## 2018-01-08 ENCOUNTER — Other Ambulatory Visit: Payer: Self-pay | Admitting: Internal Medicine

## 2018-01-29 ENCOUNTER — Ambulatory Visit: Payer: Self-pay

## 2018-01-29 NOTE — Telephone Encounter (Signed)
Pt states she saw her kidney doctor today and was concerned about her BP. Pt gives readings that indicate orthostatic hypotension; 119/55 sitting  90/55 standing.  Pt states she is feeilng intermittent dizziness and is concerned that she may need her medication adjusted.  Amlodipine has recently been increased from 2.5 to 5 mg's daily. Her records indicate average systolic BP 294'T  Appt scheduled for tomorrow.Care advice given.  Reason for Disposition . [6] Systolic BP 54-650 AND [3] taking blood pressure medications AND [3] NOT dizzy, lightheaded or weak  Answer Assessment - Initial Assessment Questions 1. BLOOD PRESSURE: "What is the blood pressure?" "Did you take at least two measurements 5 minutes apart?"     119/55 sitting 90/55 standing  120/60 stsitting 90/61 standing 2. ONSET: "When did you take your blood pressure?"     This afternoon 3. HOW: "How did you obtain the blood pressure?" (e.g., visiting nurse, automatic home BP monitor)     Kidney dr [facility] and at home 4. HISTORY: "Do you have a history of low blood pressure?" "What is your blood pressure normally?"     No on medicvation to bring it down usually 8/22 140/64 8/28 138/61 5. MEDICATIONS: "Are you taking any medications for blood pressure?" If yes: "Have they been changed recently?"     Medication has been changed July 8or 9 6. PULSE RATE: "Do you know what your pulse rate is?"      53 8/22,  57 8/28 7. OTHER SYMPTOMS: "Have you been sick recently?" "Have you had a recent injury?"     Dizziness light headed fatigue  Protocols used: LOW BLOOD PRESSURE-A-AH

## 2018-01-30 ENCOUNTER — Ambulatory Visit (INDEPENDENT_AMBULATORY_CARE_PROVIDER_SITE_OTHER): Payer: Medicare Other | Admitting: Internal Medicine

## 2018-01-30 ENCOUNTER — Encounter: Payer: Self-pay | Admitting: Internal Medicine

## 2018-01-30 VITALS — BP 122/60 | HR 56 | Temp 98.1°F | Ht 63.0 in | Wt 171.0 lb

## 2018-01-30 DIAGNOSIS — I951 Orthostatic hypotension: Secondary | ICD-10-CM | POA: Insufficient documentation

## 2018-01-30 DIAGNOSIS — E039 Hypothyroidism, unspecified: Secondary | ICD-10-CM

## 2018-01-30 DIAGNOSIS — R001 Bradycardia, unspecified: Secondary | ICD-10-CM

## 2018-01-30 DIAGNOSIS — I10 Essential (primary) hypertension: Secondary | ICD-10-CM

## 2018-01-30 DIAGNOSIS — I480 Paroxysmal atrial fibrillation: Secondary | ICD-10-CM

## 2018-01-30 MED ORDER — AMLODIPINE BESYLATE 2.5 MG PO TABS: 2.5000 mg | ORAL_TABLET | Freq: Every day | ORAL | 1 refills | Status: DC

## 2018-01-30 MED ORDER — LEVOTHYROXINE SODIUM 88 MCG PO TABS
88.0000 ug | ORAL_TABLET | Freq: Every day | ORAL | 1 refills | Status: DC
Start: 2018-01-30 — End: 2019-02-02

## 2018-01-30 NOTE — Patient Instructions (Signed)
Cut Norvasc back to 2.5 mg daily in am but if you still feel dizzy try to take at night   Orthostatic Hypotension Orthostatic hypotension is a sudden drop in blood pressure that happens when you quickly change positions, such as when you get up from a seated or lying position. Blood pressure is a measurement of how strongly, or weakly, your blood is pressing against the walls of your arteries. Arteries are blood vessels that carry blood from your heart throughout your body. When blood pressure is too low, you may not get enough blood to your brain or to the rest of your organs. This can cause weakness, light-headedness, rapid heartbeat, and fainting. This can last for just a few seconds or for up to a few minutes. Orthostatic hypotension is usually not a serious problem. However, if it happens frequently or gets worse, it may be a sign of something more serious. What are the causes? This condition may be caused by:  Sudden changes in posture, such as standing up quickly after you have been sitting or lying down.  Blood loss.  Loss of body fluids (dehydration).  Heart problems.  Hormone (endocrine) problems.  Pregnancy.  Severe infection.  Lack of certain nutrients.  Severe allergic reactions (anaphylaxis).  Certain medicines, such as blood pressure medicine or medicines that make the body lose excess fluids (diuretics). Sometimes, this condition can be caused by not taking medicine as directed, such as taking too much of a certain medicine.  What increases the risk? Certain factors can make you more likely to develop orthostatic hypotension, including:  Age. Risk increases as you get older.  Conditions that affect the heart or the central nervous system.  Taking certain medicines, such as blood pressure medicine or diuretics.  Being pregnant.  What are the signs or symptoms? Symptoms of this condition may include:  Weakness.  Light-headedness.  Dizziness.  Blurred  vision.  Fatigue.  Rapid heartbeat.  Fainting, in severe cases.  How is this diagnosed? This condition is diagnosed based on:  Your medical history.  Your symptoms.  Your blood pressure measurement. Your health care provider will check your blood pressure when you are: ? Lying down. ? Sitting. ? Standing.  A blood pressure reading is recorded as two numbers, such as "120 over 80" (or 120/80). The first ("top") number is called the systolic pressure. It is a measure of the pressure in your arteries as your heart beats. The second ("bottom") number is called the diastolic pressure. It is a measure of the pressure in your arteries when your heart relaxes between beats. Blood pressure is measured in a unit called mm Hg. Healthy blood pressure for adults is 120/80. If your blood pressure is below 90/60, you may be diagnosed with hypotension. Other information or tests that may be used to diagnose orthostatic hypotension include:  Your other vital signs, such as your heart rate and temperature.  Blood tests.  Tilt table test. For this test, you will be safely secured to a table that moves you from a lying position to an upright position. Your heart rhythm and blood pressure will be monitored during the test.  How is this treated? Treatment for this condition may include:  Changing your diet. This may involve eating more salt (sodium) or drinking more water.  Taking medicines to raise your blood pressure.  Changing the dosage of certain medicines you are taking that might be lowering your blood pressure.  Wearing compression stockings. These stockings help to prevent  blood clots and reduce swelling in your legs.  In some cases, you may need to go to the hospital for:  Fluid replacement. This means you will receive fluids through an IV tube.  Blood replacement. This means you will receive donated blood through an IV tube (transfusion).  Treating an infection or heart problems,  if this applies.  Monitoring. You may need to be monitored while medicines that you are taking wear off.  Follow these instructions at home: Eating and drinking   Drink enough fluid to keep your urine clear or pale yellow.  Eat a healthy diet and follow instructions from your health care provider about eating or drinking restrictions. A healthy diet includes: ? Fresh fruits and vegetables. ? Whole grains. ? Lean meats. ? Low-fat dairy products.  Eat extra salt only as directed. Do not add extra salt to your diet unless your health care provider told you to do that.  Eat frequent, small meals.  Avoid standing up suddenly after eating. Medicines  Take over-the-counter and prescription medicines only as told by your health care provider. ? Follow instructions from your health care provider about changing the dosage of your current medicines, if this applies. ? Do not stop or adjust any of your medicines on your own. General instructions  Wear compression stockings as told by your health care provider.  Get up slowly from lying down or sitting positions. This gives your blood pressure a chance to adjust.  Avoid hot showers and excessive heat as directed by your health care provider.  Return to your normal activities as told by your health care provider. Ask your health care provider what activities are safe for you.  Do not use any products that contain nicotine or tobacco, such as cigarettes and e-cigarettes. If you need help quitting, ask your health care provider.  Keep all follow-up visits as told by your health care provider. This is important. Contact a health care provider if:  You vomit.  You have diarrhea.  You have a fever for more than 2-3 days.  You feel more thirsty than usual.  You feel weak and tired. Get help right away if:  You have chest pain.  You have a fast or irregular heartbeat.  You develop numbness in any part of your body.  You cannot  move your arms or your legs.  You have trouble speaking.  You become sweaty or feel lightheaded.  You faint.  You feel short of breath.  You have trouble staying awake.  You feel confused. This information is not intended to replace advice given to you by your health care provider. Make sure you discuss any questions you have with your health care provider. Document Released: 05/10/2002 Document Revised: 02/06/2016 Document Reviewed: 11/10/2015 Elsevier Interactive Patient Education  2018 [employer].

## 2018-01-30 NOTE — Progress Notes (Signed)
Chief Complaint  Patient presents with  . Dizziness   Dizziness  1. Orthostatics + lying 150/60 HR 55, sitting 122/56 HR 59, standing 98/54 HR 58. She also was at Dr. Candiss Norse (renal) office yesterday and sitting BP was 119/55 and standing 90/59. She has been feeling like this since Norvasc increased from 2.5 to 5 mg on 12/08/17. She also takes micardis 40 mg qd for BP  2. pAF f/u with Dr. Nehemiah Massed 02/12/18    Review of Systems  Cardiovascular: Negative for chest pain and palpitations.       +chronic bradycardia   Musculoskeletal: Negative for falls.  Neurological: Positive for dizziness. Negative for loss of consciousness.   Past Medical History:  Diagnosis Date  . allergic rhinitis   . Atrial fibrillation ([place])   . Chronic kidney disease, stage I    mild, did improve with cessation of NSAIDs  . Elbow fracture, left    repaired Jan 2000  . Hyperlipidemia   . Hypertension   . hypothyroidism    medicated since age 75, no prior surgery  . 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 ABDOMINAL HYSTERECTOMY  1979  . APPENDECTOMY  1969  . BILATERAL OOPHORECTOMY     squ[place]  . BLADDER REPAIR  1980  . OVARIAN CYST REMOVAL  April 1969  . REPLACEMENT TOTAL KNEE BILATERAL  2009  . reverse shoulder surgery  04/05/2016  . ROTATOR CUFF REPAIR  Jan 2000  . ROTATOR CUFF REPAIR  2000   Family History  Problem Relation Age of Onset  . Hypertension Mother   . Dementia Mother   . Heart disease Father 44       MI  . Cancer Paternal Grandfather 53       colon  . Breast cancer Neg Hx    Social History   Socioeconomic History  . Marital status: Married    Spouse name: Not on file  . Number of children: Not on file  . Years of education: Not on file  . Highest education level: Not on file  Occupational History  . Not on file  Social Needs  . Financial resource strain: Not hard at all  . Food  insecurity:    Worry: Never true    Inability: Never true  . Transportation needs:    Medical: No    Non-medical: No  Tobacco Use  . Smoking status: Never Smoker  . Smokeless tobacco: Never Used  Substance and Sexual Activity  . Alcohol use: Yes  . Drug use: No  . Sexual activity: Not on file  Lifestyle  . Physical activity:    Days per week: Not on file    Minutes per session: Not on file  . Stress: Not on file  Relationships  . Social connections:    Talks on phone: Not on file    Gets together: Not on file    Attends religious service: Not on file    Active member of club or organization: Not on file    Attends meetings of clubs or organizations: Not on file    Relationship status: Not on file  . Intimate partner violence:    Fear of current or ex partner: No    Emotionally abused: No    Physically abused: No    Forced sexual activity: No  Other Topics Concern  . Not on file  Social History Narrative  Married    [place]    Current Meds  Medication Sig  . acetaminophen (TYLENOL) 500 MG tablet Take 500 mg by mouth every 6 (six) hours as needed (As needed for pain).  [name] amLODipine (NORVASC) 2.5 MG tablet Take 1 tablet (2.5 mg total) by mouth daily.  [name] amoxicillin (AMOXIL) 500 MG capsule Take 500 mg by mouth. Take four tablets prior to dental work.  . Artificial Tear Ointment (EYE LUBRICANT OP) Apply 1 drop to eye 2 (two) times daily.  [name] BIOTIN PO Take 1 capsule by mouth daily.  . Cholecalciferol (VITAMIN D3) 1000 UNITS CAPS Take 1,000 Units by mouth daily.   [name] levothyroxine (SYNTHROID, LEVOTHROID) 88 MCG tablet Take 1 tablet (88 mcg total) by mouth daily before breakfast.  . Liniments (ACE PAIN RELIEVING PATCH EX) Apply 1 patch topically daily as needed.  . meclizine (ANTIVERT) 25 MG tablet TAKE 1 TABLET (25 MG TOTAL) BY MOUTH 3 (THREE) TIMES DAILY AS NEEDED FOR DIZZINESS.  [name] pravastatin (PRAVACHOL) 20 MG tablet TAKE 1 TABLET BY MOUTH EVERY DAY  . telmisartan  (MICARDIS) 40 MG tablet Take 40 mg by mouth daily.  [name] warfarin (COUMADIN) 1 MG tablet Take 1 mg by mouth daily.   . [DISCONTINUED] amLODipine (NORVASC) 5 MG tablet Take 1 tablet (5 mg total) by mouth daily.  . [DISCONTINUED] levothyroxine (SYNTHROID, LEVOTHROID) 88 MCG tablet Take 1 tablet (88 mcg total) by mouth daily before breakfast.  . [DISCONTINUED] predniSONE (DELTASONE) 10 MG tablet 6 tablets on Day 1 , then reduce by 1 tablet daily until gone   Allergies  Allergen Reactions  . Chocolate Flavor Hives  . Clindamycin/Lincomycin     Tongue swelled,  Throat felt funny  . Onion Swelling    Throat swelling  . Other Hives    Raw Onions, Spring Time causes eye itching and runny nose. *Pt. Also Had Large Blisters from Tourniquets applied to her legs during her knee replacement surgery*  . Tape   . Hctz [Hydrochlorothiazide] Rash    rash   Recent Results (from the past 2160 hour(s))  Basic metabolic panel     Status: Abnormal   Collection Time: 12/03/17  8:04 AM  Result Value Ref Range   Sodium 139 135 - 145 mEq/L   Potassium 4.6 3.5 - 5.1 mEq/L   Chloride 104 96 - 112 mEq/L   CO2 27 19 - 32 mEq/L   Glucose, Bld 99 70 - 99 mg/dL   BUN 32 (H) 6 - 23 mg/dL   Creatinine, Ser 1.34 (H) 0.40 - 1.20 mg/dL   Calcium 9.4 8.4 - 10.5 mg/dL   GFR 40.21 (L) >60.00 mL/min  Basic metabolic panel     Status: Abnormal   Collection Time: 12/04/17  1:12 PM  Result Value Ref Range   Sodium 139 135 - 145 mmol/L   Potassium 4.6 3.5 - 5.1 mmol/L   Chloride 106 98 - 111 mmol/L    Comment: Please note change in reference range.   CO2 25 22 - 32 mmol/L   Glucose, Bld 139 (H) 70 - 99 mg/dL    Comment: Please note change in reference range.   BUN 41 (H) 8 - 23 mg/dL    Comment: Please note change in reference range.   Creatinine, Ser 1.46 (H) 0.44 - 1.00 mg/dL   Calcium 9.1 8.9 - 10.3 mg/dL   GFR calc non Af Amer 32 (L) >60 mL/min   GFR calc Af Amer 37 (L) >60  mL/min    Comment: (NOTE) The eGFR  has been calculated using the CKD EPI equation. This calculation has not been validated in all clinical situations. eGFR's persistently <60 mL/min signify possible Chronic Kidney Disease.    Anion gap 8 5 - 15    Comment: Performed at [facility], [address] [place], [place] [address]  CBC     Status: Abnormal   Collection Time: 12/04/17  1:12 PM  Result Value Ref Range   WBC 6.9 3.6 - 11.0 K/uL   RBC 3.73 (L) 3.80 - 5.20 MIL/uL   Hemoglobin 11.7 (L) 12.0 - 16.0 g/dL   HCT 35.6 35.0 - 47.0 %   MCV 95.4 80.0 - 100.0 fL   MCH 31.5 26.0 - 34.0 pg   MCHC 33.0 32.0 - 36.0 g/dL   RDW 14.5 11.5 - 14.5 %   Platelets 283 150 - 440 K/uL    Comment: Performed at [facility], [address] [place], [place] [address]  Troponin I     Status: None   Collection Time: 12/04/17  1:12 PM  Result Value Ref Range   Troponin I <0.03 <0.03 ng/mL    Comment: Performed at [facility], [address] [place], [place] [address]  Urinalysis, Complete w Microscopic     Status: Abnormal   Collection Time: 12/04/17  3:48 PM  Result Value Ref Range   Color, Urine YELLOW (A) YELLOW   APPearance CLEAR (A) CLEAR   Specific Gravity, Urine 1.013 1.005 - 1.030   pH 5.0 5.0 - 8.0   Glucose, UA NEGATIVE NEGATIVE mg/dL   Hgb urine dipstick NEGATIVE NEGATIVE   Bilirubin Urine NEGATIVE NEGATIVE   Ketones, ur NEGATIVE NEGATIVE mg/dL   Protein, ur NEGATIVE NEGATIVE mg/dL   Nitrite NEGATIVE NEGATIVE   Leukocytes, UA NEGATIVE NEGATIVE   RBC / HPF 0-5 0 - 5 RBC/hpf   WBC, UA 0-5 0 - 5 WBC/hpf   Bacteria, UA NONE SEEN NONE SEEN   Squamous Epithelial / LPF 0-5 0 - 5    Comment: Performed at [facility], [address] [place], [place] [address]  Comprehensive metabolic panel     Status: Abnormal   Collection Time: 12/08/17 10:27 AM  Result Value Ref Range   Sodium 138 135 - 145 mEq/L   Potassium 4.7 3.5 - 5.1 mEq/L   Chloride 104 96 - 112 mEq/L   CO2 27 19 - 32  mEq/L   Glucose, Bld 96 70 - 99 mg/dL   BUN 35 (H) 6 - 23 mg/dL   Creatinine, Ser 1.39 (H) 0.40 - 1.20 mg/dL   Total Bilirubin 0.3 0.2 - 1.2 mg/dL   Alkaline Phosphatase 70 39 - 117 U/L   AST 15 0 - 37 U/L   ALT 9 0 - 35 U/L   Total Protein 7.0 6.0 - 8.3 g/dL   Albumin 3.6 3.5 - 5.2 g/dL   Calcium 9.1 8.4 - 10.5 mg/dL   GFR 38.54 (L) >60.00 mL/min  TSH     Status: None   Collection Time: 12/08/17 10:27 AM  Result Value Ref Range   TSH 1.47 0.35 - 4.50 uIU/mL   Objective  Body mass index is 30.29 kg/m. Wt Readings from Last 3 Encounters:  01/30/18 171 lb (77.6 kg)  12/17/17 170 lb 9.6 oz (77.4 kg)  12/08/17 169 lb 6.4 oz (76.8 kg)   Temp Readings from Last 3 Encounters:  01/30/18 98.1 F (36.7 C) (Oral)  12/17/17  97.9 F (36.6 C) (Oral)  12/08/17 97.9 F (36.6 C) (Oral)   BP Readings from Last 3 Encounters:  01/30/18 122/60  12/17/17 (!) 146/60  12/08/17 (!) 144/66   Pulse Readings from Last 3 Encounters:  01/30/18 (!) 56  12/17/17 (!) 55  12/08/17 (!) 54    Physical Exam  Constitutional: She is oriented to person, place, and time. She appears well-developed and well-nourished. She is cooperative.  HENT:  Head: Normocephalic and atraumatic.  Mouth/Throat: Oropharynx is clear and moist and mucous membranes are normal.  Eyes: Pupils are equal, round, and reactive to light. Conjunctivae are normal.  Cardiovascular: Regular rhythm and normal heart sounds. Bradycardia present.  No Afib today  Pulmonary/Chest: Effort normal and breath sounds normal.  Neurological: She is alert and oriented to person, place, and time. Gait normal.  BL walks with cane   Skin: Skin is warm, dry and intact.  Psychiatric: She has a normal mood and affect. Her speech is normal and behavior is normal. Judgment and thought content normal. Cognition and memory are normal.  Nursing note and vitals reviewed.   Assessment   1. Dizziness likely medication related causing OH +  2. Paf,  chronic bradycardia  Plan  1. Reduce norvasc to 2.5 mg qd with micardis 40 mg qd  F/u with PCP as sch sooner if needed  Move slowly info given  Stay hydrated  2. Dr. Raliegh Ip appt 02/12/18   Provider: Dr. Olivia Mackie McLean-Scocuzza-Internal Medicine

## 2018-01-30 NOTE — Progress Notes (Signed)
Pre visit review using our clinic review tool, if applicable. No additional management support is needed unless otherwise documented below in the visit note. 

## 2018-02-03 ENCOUNTER — Other Ambulatory Visit: Payer: Self-pay | Admitting: Nephrology

## 2018-02-03 DIAGNOSIS — N183 Chronic kidney disease, stage 3 unspecified: Secondary | ICD-10-CM

## 2018-02-09 ENCOUNTER — Ambulatory Visit: Payer: Medicare Other

## 2018-02-10 ENCOUNTER — Ambulatory Visit
Admission: RE | Admit: 2018-02-10 | Discharge: 2018-02-10 | Disposition: A | Discharge status: Home / Self Care | Payer: Medicare Other | Source: Ambulatory Visit | Attending: Nephrology | Admitting: Nephrology

## 2018-02-10 DIAGNOSIS — N183 Chronic kidney disease, stage 3 unspecified: Secondary | ICD-10-CM

## 2018-02-24 DIAGNOSIS — G8929 Other chronic pain: Secondary | ICD-10-CM | POA: Insufficient documentation

## 2018-03-12 ENCOUNTER — Ambulatory Visit: Payer: Medicare Other | Admitting: Podiatry

## 2018-03-16 ENCOUNTER — Other Ambulatory Visit: Payer: Self-pay | Admitting: Internal Medicine

## 2018-03-16 DIAGNOSIS — Z1231 Encounter for screening mammogram for malignant neoplasm of breast: Secondary | ICD-10-CM

## 2018-03-30 ENCOUNTER — Ambulatory Visit (INDEPENDENT_AMBULATORY_CARE_PROVIDER_SITE_OTHER): Payer: Medicare Other | Admitting: Podiatry

## 2018-03-30 ENCOUNTER — Encounter: Payer: Self-pay | Admitting: Podiatry

## 2018-03-30 DIAGNOSIS — L608 Other nail disorders: Secondary | ICD-10-CM

## 2018-03-30 DIAGNOSIS — M79676 Pain in unspecified toe(s): Secondary | ICD-10-CM | POA: Diagnosis not present

## 2018-03-30 DIAGNOSIS — B351 Tinea unguium: Secondary | ICD-10-CM

## 2018-03-30 NOTE — Progress Notes (Signed)
Complaint:  Visit Type: Patient returns to my office for continued preventative foot care services. Complaint: Patient states" my nails have grown long and thick and become painful to walk and wear shoes" . The patient presents for preventative foot care services. No changes to ROS  Podiatric Exam: Vascular: dorsalis pedis and posterior tibial pulses are palpable bilateral. Capillary return is immediate. Temperature gradient is WNL. Skin turgor WNL  Sensorium: Normal Semmes Weinstein monofilament test. Normal tactile sensation bilaterally. Nail Exam: Pt has thick disfigured discolored nails with subungual debris noted bilateral entire nail hallux through fifth toenails.  Pincer toenail right hallux. Ulcer Exam: There is no evidence of ulcer or pre-ulcerative changes or infection. Orthopedic Exam: Muscle tone and strength are WNL. No limitations in general ROM. No crepitus or effusions noted. Foot type and digits show no abnormalities. HAV  B/L.  Hammer toe B/L. Skin:  . No infection or ulcers.     Diagnosis:  Onychomycosis, , Pain in right toe, pain in left toes   Treatment & Plan Procedures and Treatment: Consent by patient was obtained for treatment procedures.   Debridement of mycotic and hypertrophic toenails, 1 through 5 bilateral and clearing of subungual debris. No ulceration, no infection noted.  Return Visit-Office Procedure: Patient instructed to return to the office for a follow up visit 10 weeks  for continued evaluation and treatment.    Gardiner Barefoot DPM

## 2018-05-04 ENCOUNTER — Ambulatory Visit
Admission: RE | Admit: 2018-05-04 | Discharge: 2018-05-04 | Disposition: A | Discharge status: Home / Self Care | Payer: Medicare Other | Source: Ambulatory Visit | Attending: Internal Medicine | Admitting: Internal Medicine

## 2018-05-04 ENCOUNTER — Other Ambulatory Visit: Payer: Self-pay | Admitting: Internal Medicine

## 2018-05-04 DIAGNOSIS — Z1231 Encounter for screening mammogram for malignant neoplasm of breast: Secondary | ICD-10-CM | POA: Diagnosis not present

## 2018-06-17 LAB — BASIC METABOLIC PANEL
BUN: 38 — AB (ref 4–21)
Creatinine: 1.3 — AB (ref 0.5–1.1)
GLUCOSE: 78
POTASSIUM: 4.8 (ref 3.4–5.3)
Sodium: 138 (ref 137–147)

## 2018-06-17 LAB — CBC AND DIFFERENTIAL
HEMATOCRIT: 33 — AB (ref 36–46)
Hemoglobin: 10.7 — AB (ref 12.0–16.0)
Neutrophils Absolute: 3799
PLATELETS: 268 (ref 150–399)
WBC: 5.8

## 2018-06-22 ENCOUNTER — Encounter: Payer: Self-pay | Admitting: Internal Medicine

## 2018-06-22 ENCOUNTER — Ambulatory Visit (INDEPENDENT_AMBULATORY_CARE_PROVIDER_SITE_OTHER): Payer: Medicare Other | Admitting: Internal Medicine

## 2018-06-22 VITALS — BP 134/84 | HR 51 | Temp 97.9°F | Resp 15 | Ht 63.0 in | Wt 170.1 lb

## 2018-06-22 DIAGNOSIS — N183 Chronic kidney disease, stage 3 unspecified: Secondary | ICD-10-CM

## 2018-06-22 DIAGNOSIS — E782 Mixed hyperlipidemia: Secondary | ICD-10-CM | POA: Diagnosis not present

## 2018-06-22 DIAGNOSIS — J320 Chronic maxillary sinusitis: Secondary | ICD-10-CM | POA: Diagnosis not present

## 2018-06-22 DIAGNOSIS — E034 Atrophy of thyroid (acquired): Secondary | ICD-10-CM | POA: Diagnosis not present

## 2018-06-22 DIAGNOSIS — G6289 Other specified polyneuropathies: Secondary | ICD-10-CM

## 2018-06-22 DIAGNOSIS — R7303 Prediabetes: Secondary | ICD-10-CM

## 2018-06-22 DIAGNOSIS — I10 Essential (primary) hypertension: Secondary | ICD-10-CM

## 2018-06-22 MED ORDER — VITAMIN B-12 1000 MCG PO TABS: 1000.0000 ug | ORAL_TABLET | Freq: Every day | ORAL | 3 refills | Status: AC

## 2018-06-22 NOTE — Patient Instructions (Addendum)
I would have the fistula In your jaw repaired,  Because you can develop another infection if food gets impacted in it,   But NOT the sinus debridement  Use mouthwash after eating to try to flush the food particles out of your fistula    If your left foot starts to trip you up on more than once a month, let Dr Melrose Nakayama know that you are developing '"FOOT DROP" So he can fir you with a brace to prevent the foot from dropping   CONTINUE the PROBIOTICS  AND THE SUBLINGUAL B12 for life

## 2018-06-22 NOTE — Progress Notes (Signed)
Subjective:  Patient ID: Ana Cruz, female    DOB: Jan 25, 1934  Age: 85 y.o. MRN: [id number]  CC: The primary encounter diagnosis was Mixed hyperlipidemia. Diagnoses of Benign essential hypertension, Hypothyroidism due to acquired atrophy of thyroid, Chronic left maxillary sinusitis, Other polyneuropathy, Prediabetes, and CKD (chronic kidney disease) stage 3, GFR 30-59 ml/min (HCC) were also pertinent to this visit.  HPI ARWEN HASELEY presents for 6 month follow on hypertension,  PAF,  CKD and spinal stenosis with radiculopathy    Saw [facility] for NEUROPATHY BILATERAL CONFIRMED WITH [facility] NERVE CONDUCTION STUDIES.  POTTER DI NOT CHECK LABS.  USED TO BE B12 DEFICIENT  OVER 7 YEARS AGO. HAS NOT FALLEN.   USES A CANE    She has developed early signs of foot drop on the left that started years ago.  It is only noticed when she has been walking long distances or at the end of the  day.  She believes she first noticed it around the time of her knee replacement.  Goes to the inground pool daily for exercise at [facility].   No results found for: VITAMINB12   SINUSITIS AND DIZZINESS:  SAW ENT AND HAD AN  MRI in August ordered by ENT.  Had oral surgery evaluate her who found a persistent defect in bone after tooth extraction one year earlier.   No acute findings except chronic sinusitis and chronic right cerebellar infarct (small) Was treated for sinusitis  In July by [facility]  With Cefdinir for  ten days, followed by Septra for 16 of  21 planned days.  Developed diarrhea for 3 days , lost 7 lbs during the treatment with Septra .  Sinusitis has resolved but she still has the fistula and is considering not having It repaired.  .    Mammogram Dec 2019 normal   Her 41 yr old son with brain tumor presented with seizure IN  2018. CURED AS OF JAN 3   Dizziness has stopped with amlodipine .  Home bps ar e all 130/80  NEPHROLOGY FOR CKD.  GFR WAS 37% WITH [facility] IN September.  6 MONTH FOLLOW UP .  HAD LABS DONE LAST WEEK AT HIS OFFICE.   Outpatient Medications Prior to Visit  Medication Sig Dispense Refill  . acetaminophen (TYLENOL) 500 MG tablet Take 500 mg by mouth every 6 (six) hours as needed (As needed for pain).    . Artificial Tear Ointment (EYE LUBRICANT OP) Apply 1 drop to eye 2 (two) times daily.    . Cholecalciferol (VITAMIN D3) 1000 UNITS CAPS Take 1,000 Units by mouth daily.     [name] levothyroxine (SYNTHROID, LEVOTHROID) 88 MCG tablet Take 1 tablet (88 mcg total) by mouth daily before breakfast. 90 tablet 1  . pravastatin (PRAVACHOL) 20 MG tablet TAKE 1 TABLET BY MOUTH EVERY DAY 90 tablet 1  . telmisartan (MICARDIS) 40 MG tablet Take 40 mg by mouth daily.    [name] warfarin (COUMADIN) 1 MG tablet Take 1 mg by mouth daily.     [name] amoxicillin (AMOXIL) 500 MG capsule Take 500 mg by mouth. Take four tablets prior to dental work.    [name] amLODipine (NORVASC) 2.5 MG tablet Take 1 tablet (2.5 mg total) by mouth daily. (Patient not taking: Reported on 06/22/2018) 90 tablet 1  . BIOTIN PO Take 1 capsule by mouth daily.    . Liniments (ACE PAIN RELIEVING PATCH EX) Apply 1 patch topically daily as needed.    . meclizine (ANTIVERT) 25  MG tablet TAKE 1 TABLET (25 MG TOTAL) BY MOUTH 3 (THREE) TIMES DAILY AS NEEDED FOR DIZZINESS. (Patient not taking: Reported on 06/22/2018) 30 tablet 2   No facility-administered medications prior to visit.     Review of Systems;  Patient denies headache, fevers, malaise, unintentional weight loss, skin rash, eye pain, sinus congestion and sinus pain, sore throat, dysphagia,  hemoptysis , cough, dyspnea, wheezing, chest pain, palpitations, orthopnea, edema, abdominal pain, nausea, melena, diarrhea, constipation, flank pain, dysuria, hematuria, urinary  Frequency, nocturia, numbness, tingling, seizures,  Focal weakness, Loss of consciousness,  Tremor, insomnia, depression, anxiety, and suicidal ideation.      Objective:  BP 134/84 (BP Location: Left Arm, Patient  Position: Sitting, Cuff Size: Normal)   Pulse (!) 51   Temp 97.9 F (36.6 C) (Oral)   Resp 15   Ht 5\' 3"  (1.6 m)   Wt 170 lb 1.9 oz (77.2 kg)   SpO2 98%   BMI 30.14 kg/m   BP Readings from Last 3 Encounters:  06/22/18 134/84  01/30/18 122/60  12/17/17 (!) 146/60    Wt Readings from Last 3 Encounters:  06/22/18 170 lb 1.9 oz (77.2 kg)  01/30/18 171 lb (77.6 kg)  12/17/17 170 lb 9.6 oz (77.4 kg)    General appearance: alert, cooperative and appears stated age Ears: normal TM's and external ear canals both ears Throat: lips, mucosa, and tongue normal; teeth and gums normal Neck: no adenopathy, no carotid bruit, supple, symmetrical, trachea midline and thyroid not enlarged, symmetric, no tenderness/mass/nodules Back: symmetric, no curvature. ROM normal. No CVA tenderness. Lungs: clear to auscultation bilaterally Heart: regular rate and rhythm, S1, S2 normal, no murmur, click, rub or gallop Abdomen: soft, non-tender; bowel sounds normal; no masses,  no organomegaly Pulses: 2+ and symmetric Skin: Skin color, texture, turgor normal. No rashes or lesions Lymph nodes: Cervical, supraclavicular, and axillary nodes normal.  Lab Results  Component Value Date   HGBA1C 5.9 08/20/2016   HGBA1C 6.1 02/20/2016    Lab Results  Component Value Date   CREATININE 1.39 (H) 12/08/2017   CREATININE 1.46 (H) 12/04/2017   CREATININE 1.34 (H) 12/03/2017    Lab Results  Component Value Date   WBC 6.9 12/04/2017   HGB 11.7 (L) 12/04/2017   HCT 35.6 12/04/2017   PLT 283 12/04/2017   GLUCOSE 96 12/08/2017   CHOL 198 08/21/2015   TRIG 81.0 08/21/2015   HDL 71.90 08/21/2015   LDLDIRECT 107.0 03/14/2017   LDLCALC 110 (H) 08/21/2015   ALT 9 12/08/2017   AST 15 12/08/2017   NA 138 12/08/2017   K 4.7 12/08/2017   CL 104 12/08/2017   CREATININE 1.39 (H) 12/08/2017   BUN 35 (H) 12/08/2017   CO2 27 12/08/2017   TSH 1.47 12/08/2017   HGBA1C 5.9 08/20/2016    Mm 3d Screen Breast  Bilateral  Result Date: 05/04/2018 CLINICAL DATA:  Screening. EXAM: DIGITAL SCREENING BILATERAL MAMMOGRAM WITH TOMO AND CAD COMPARISON:  Previous exam(s). ACR Breast Density Category b: There are scattered areas of fibroglandular density. FINDINGS: There are no findings suspicious for malignancy. Images were processed with CAD. IMPRESSION: No mammographic evidence of malignancy. A result letter of this screening mammogram will be mailed directly to the patient. RECOMMENDATION: Screening mammogram in one year. (Code:SM-B-01Y) BI-RADS CATEGORY  1: Negative. Electronically Signed   By: Kristopher Oppenheim M.D.   On: 05/04/2018 15:26    Assessment & Plan:   Problem List Items Addressed This Visit    Benign  essential hypertension    Well controlled on current regimen. Renal function stable, no changes today.  Lab Results  Component Value Date   CREATININE 1.39 (H) 12/08/2017   Lab Results  Component Value Date   NA 138 12/08/2017   K 4.7 12/08/2017   CL 104 12/08/2017   CO2 27 12/08/2017         Relevant Orders   Comprehensive metabolic panel   Chronic left maxillary sinusitis    Resolved after 2 rounds of antibiotics in July/August. . 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  Encouraged to have this resolved to avoid future infections       CKD (chronic kidney disease) stage 3, GFR 30-59 ml/min (HCC)    GFR  Stable .  Has seen nephrology .  Continue telmisartan, pravastatin. Avoid NSAIDs. Goal 60 iunces water or other non caffeinated beverages daily   Lab Results  Component Value Date   CREATININE 1.39 (H) 12/08/2017   No results found for: MICROALBUR, [id number]       Hyperlipidemia - Primary    Managed with pravastatin . Fasting lipids and liver enzymes are due       Relevant Orders   Lipid panel   Hypothyroidism    Thyroid function is WNL on current dose.  6 month follow up due.   Lab Results    Component Value Date   TSH 1.47 12/08/2017         Relevant Orders   TSH   Peripheral neuropathy    Etiology unclear,  Likely multifactorial.  Resume B12.  Advised to return to [facility] if foot drop becomes more problematic.  Lab Results  Component Value Date   TSH 1.47 12/08/2017   Lab Results  Component Value Date   HGBA1C 5.9 08/20/2016          Other Visit Diagnoses    Prediabetes          I have discontinued Marcie Bal L. Holquin's Liniments (ACE PAIN RELIEVING PATCH EX), BIOTIN PO, meclizine, and amLODipine. I am also having her start on vitamin B-12. Additionally, I am having her maintain her Vitamin D3, amoxicillin, warfarin, acetaminophen, Artificial Tear Ointment (EYE LUBRICANT OP), telmisartan, levothyroxine, and pravastatin.  Meds ordered this encounter  Medications  . vitamin B-12 (CYANOCOBALAMIN) 1000 MCG tablet    Sig: Take 1 tablet (1,000 mcg total) by mouth daily.    Dispense:  90 tablet    Refill:  3    Medications Discontinued During This Encounter  Medication Reason  . amLODipine (NORVASC) 2.5 MG tablet Patient has not taken in last 30 days  . BIOTIN PO Patient has not taken in last 30 days  . Liniments (ACE PAIN RELIEVING PATCH EX) Patient has not taken in last 30 days  . meclizine (ANTIVERT) 25 MG tablet Patient has not taken in last 30 days    Follow-up: Return in about 6 months (around 12/21/2018).   Crecencio Mc, MD

## 2018-06-23 ENCOUNTER — Telehealth: Payer: Self-pay | Admitting: Internal Medicine

## 2018-06-23 DIAGNOSIS — J320 Chronic maxillary sinusitis: Secondary | ICD-10-CM | POA: Insufficient documentation

## 2018-06-23 DIAGNOSIS — G629 Polyneuropathy, unspecified: Secondary | ICD-10-CM | POA: Insufficient documentation

## 2018-06-23 NOTE — Assessment & Plan Note (Signed)
Well controlled on current regimen. Renal function stable, no changes today.  Lab Results  Component Value Date   CREATININE 1.39 (H) 12/08/2017   Lab Results  Component Value Date   NA 138 12/08/2017   K 4.7 12/08/2017   CL 104 12/08/2017   CO2 27 12/08/2017

## 2018-06-23 NOTE — Assessment & Plan Note (Signed)
Etiology unclear,  Likely multifactorial.  Resume B12.  Advised to return to [facility] if foot drop becomes more problematic.  Lab Results  Component Value Date   TSH 1.47 12/08/2017   Lab Results  Component Value Date   HGBA1C 5.9 08/20/2016

## 2018-06-23 NOTE — Assessment & Plan Note (Signed)
GFR  Stable .  Has seen nephrology .  Continue telmisartan, pravastatin. Avoid NSAIDs. Goal 60 iunces water or other non caffeinated beverages daily   Lab Results  Component Value Date   CREATININE 1.39 (H) 12/08/2017   No results found for: Derl Barrow

## 2018-06-23 NOTE — Assessment & Plan Note (Signed)
Managed with pravastatin . Fasting lipids and liver enzymes are due

## 2018-06-23 NOTE — Assessment & Plan Note (Signed)
Resolved after 2 rounds of antibiotics in July/August. . 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  Encouraged to have this resolved to avoid future infections

## 2018-06-23 NOTE — Assessment & Plan Note (Signed)
Thyroid function is WNL on current dose.  6 month follow up due.   Lab Results  Component Value Date   TSH 1.47 12/08/2017

## 2018-06-25 ENCOUNTER — Encounter: Payer: Self-pay | Admitting: Podiatry

## 2018-06-25 ENCOUNTER — Ambulatory Visit (INDEPENDENT_AMBULATORY_CARE_PROVIDER_SITE_OTHER): Payer: Medicare Other | Admitting: Podiatry

## 2018-06-25 DIAGNOSIS — M79676 Pain in unspecified toe(s): Secondary | ICD-10-CM

## 2018-06-25 DIAGNOSIS — B351 Tinea unguium: Secondary | ICD-10-CM

## 2018-06-25 DIAGNOSIS — L608 Other nail disorders: Secondary | ICD-10-CM

## 2018-06-25 NOTE — Progress Notes (Signed)
Complaint:  Visit Type: Patient returns to my office for continued preventative foot care services. Complaint: Patient states" my nails have grown long and thick and become painful to walk and wear shoes" . The patient presents for preventative foot care services. No changes to ROS  Podiatric Exam: Vascular: dorsalis pedis and posterior tibial pulses are palpable bilateral. Capillary return is immediate. Temperature gradient is WNL. Skin turgor WNL  Sensorium: Normal Semmes Weinstein monofilament test. Normal tactile sensation bilaterally. Nail Exam: Pt has thick disfigured discolored nails with subungual debris noted bilateral entire nail hallux through fifth toenails.  Pincer toenail right hallux. Ulcer Exam: There is no evidence of ulcer or pre-ulcerative changes or infection. Orthopedic Exam: Muscle tone and strength are WNL. No limitations in general ROM. No crepitus or effusions noted. Foot type and digits show no abnormalities. HAV  B/L.  Hammer toe B/L. Skin:  . No infection or ulcers.     Diagnosis:  Onychomycosis, , Pain in right toe, pain in left toes   Treatment & Plan Procedures and Treatment: Consent by patient was obtained for treatment procedures.   Debridement of mycotic and hypertrophic toenails, 1 through 5 bilateral and clearing of subungual debris. No ulceration, no infection noted. Debride ingrown nail right hallux.  Neosporin/DSD. Return Visit-Office Procedure: Patient instructed to return to the office for a follow up visit 10 weeks  for continued evaluation and treatment.    Gardiner Barefoot DPM

## 2018-06-26 ENCOUNTER — Other Ambulatory Visit (INDEPENDENT_AMBULATORY_CARE_PROVIDER_SITE_OTHER): Payer: Medicare Other

## 2018-06-26 DIAGNOSIS — G6289 Other specified polyneuropathies: Secondary | ICD-10-CM

## 2018-06-26 DIAGNOSIS — E034 Atrophy of thyroid (acquired): Secondary | ICD-10-CM

## 2018-06-26 DIAGNOSIS — I10 Essential (primary) hypertension: Secondary | ICD-10-CM

## 2018-06-26 DIAGNOSIS — R7303 Prediabetes: Secondary | ICD-10-CM

## 2018-06-26 DIAGNOSIS — E782 Mixed hyperlipidemia: Secondary | ICD-10-CM

## 2018-06-26 LAB — HEMOGLOBIN A1C: Hgb A1c MFr Bld: 6 % (ref 4.6–6.5)

## 2018-06-26 NOTE — Addendum Note (Signed)
Addended by: Arby Barrette on: 06/26/2018 10:12 AM   Modules accepted: Orders

## 2018-06-29 LAB — COMPREHENSIVE METABOLIC PANEL
AG RATIO: 1.2 (calc) (ref 1.0–2.5)
ALKALINE PHOSPHATASE (APISO): 92 U/L (ref 33–130)
ALT: 9 U/L (ref 6–29)
AST: 18 U/L (ref 10–35)
Albumin: 3.8 g/dL (ref 3.6–5.1)
BUN/Creatinine Ratio: 27 (calc) — ABNORMAL HIGH (ref 6–22)
BUN: 32 mg/dL — ABNORMAL HIGH (ref 7–25)
CHLORIDE: 106 mmol/L (ref 98–110)
CO2: 26 mmol/L (ref 20–32)
Calcium: 9.5 mg/dL (ref 8.6–10.4)
Creat: 1.18 mg/dL — ABNORMAL HIGH (ref 0.60–0.88)
GLOBULIN: 3.1 g/dL (ref 1.9–3.7)
Glucose, Bld: 87 mg/dL (ref 65–99)
Potassium: 4.4 mmol/L (ref 3.5–5.3)
Sodium: 141 mmol/L (ref 135–146)
Total Bilirubin: 0.5 mg/dL (ref 0.2–1.2)
Total Protein: 6.9 g/dL (ref 6.1–8.1)

## 2018-06-29 LAB — LIPID PANEL
CHOLESTEROL: 210 mg/dL — AB (ref <200)
HDL: 69 mg/dL (ref >50)
LDL Cholesterol (Calc): 123 mg/dL (calc) — ABNORMAL HIGH
Non-HDL Cholesterol (Calc): 141 mg/dL (calc) — ABNORMAL HIGH (ref <130)
TRIGLYCERIDES: 81 mg/dL (ref <150)
Total CHOL/HDL Ratio: 3 (calc) (ref <5.0)

## 2018-06-29 LAB — RPR: RPR: NONREACTIVE

## 2018-06-29 LAB — TSH: TSH: 1.38 mIU/L (ref 0.40–4.50)

## 2018-07-14 ENCOUNTER — Other Ambulatory Visit: Payer: Self-pay

## 2018-09-03 ENCOUNTER — Ambulatory Visit: Payer: Medicare Other | Admitting: Podiatry

## 2018-09-14 ENCOUNTER — Ambulatory Visit: Payer: Self-pay

## 2018-10-12 ENCOUNTER — Ambulatory Visit (INDEPENDENT_AMBULATORY_CARE_PROVIDER_SITE_OTHER): Payer: Medicare Other | Admitting: Podiatry

## 2018-10-12 ENCOUNTER — Encounter: Payer: Self-pay | Admitting: Podiatry

## 2018-10-12 ENCOUNTER — Other Ambulatory Visit: Payer: Self-pay

## 2018-10-12 VITALS — Temp 97.1°F

## 2018-10-12 DIAGNOSIS — M79676 Pain in unspecified toe(s): Secondary | ICD-10-CM

## 2018-10-12 DIAGNOSIS — B351 Tinea unguium: Secondary | ICD-10-CM | POA: Diagnosis not present

## 2018-10-12 DIAGNOSIS — D689 Coagulation defect, unspecified: Secondary | ICD-10-CM

## 2018-10-12 DIAGNOSIS — L608 Other nail disorders: Secondary | ICD-10-CM

## 2018-10-12 NOTE — Progress Notes (Signed)
Complaint:  Visit Type: Patient returns to my office for continued preventative foot care services. Complaint: Patient states" my nails have grown long and thick and become painful to walk and wear shoes" . The patient presents for preventative foot care services. No changes to ROS.  Patient is taking coumadin.  Podiatric Exam: Vascular: dorsalis pedis and posterior tibial pulses are palpable bilateral. Capillary return is immediate. Temperature gradient is WNL. Skin turgor WNL  Sensorium: Normal Semmes Weinstein monofilament test. Normal tactile sensation bilaterally. Nail Exam: Pt has thick disfigured discolored nails with subungual debris noted bilateral entire nail hallux through fifth toenails.  Pincer toenail right hallux. Ulcer Exam: There is no evidence of ulcer or pre-ulcerative changes or infection. Orthopedic Exam: Muscle tone and strength are WNL. No limitations in general ROM. No crepitus or effusions noted. Foot type and digits show no abnormalities. HAV  B/L.  Hammer toe B/L. Skin:  . No infection or ulcers.     Diagnosis:  Onychomycosis, , Pain in right toe, pain in left toes   Treatment & Plan Procedures and Treatment: Consent by patient was obtained for treatment procedures.   Debridement of mycotic and hypertrophic toenails, 1 through 5 bilateral and clearing of subungual debris. No ulceration, no infection noted. Debride ingrown nail right hallux.  Neosporin/DSD. Return Visit-Office Procedure: Patient instructed to return to the office for a follow up visit 10 weeks  for continued evaluation and treatment.    Gardiner Barefoot DPM

## 2018-10-29 ENCOUNTER — Ambulatory Visit (INDEPENDENT_AMBULATORY_CARE_PROVIDER_SITE_OTHER): Payer: Medicare Other | Admitting: Vascular Surgery

## 2018-11-02 ENCOUNTER — Encounter (INDEPENDENT_AMBULATORY_CARE_PROVIDER_SITE_OTHER): Payer: Self-pay | Admitting: Vascular Surgery

## 2018-11-02 ENCOUNTER — Ambulatory Visit (INDEPENDENT_AMBULATORY_CARE_PROVIDER_SITE_OTHER): Payer: Medicare Other | Admitting: Vascular Surgery

## 2018-11-02 ENCOUNTER — Other Ambulatory Visit: Payer: Self-pay

## 2018-11-02 VITALS — BP 148/73 | HR 64 | Resp 16 | Ht 63.0 in | Wt 171.8 lb

## 2018-11-02 DIAGNOSIS — M48062 Spinal stenosis, lumbar region with neurogenic claudication: Secondary | ICD-10-CM

## 2018-11-02 DIAGNOSIS — I890 Lymphedema, not elsewhere classified: Secondary | ICD-10-CM

## 2018-11-02 DIAGNOSIS — I480 Paroxysmal atrial fibrillation: Secondary | ICD-10-CM

## 2018-11-02 DIAGNOSIS — I872 Venous insufficiency (chronic) (peripheral): Secondary | ICD-10-CM

## 2018-11-02 DIAGNOSIS — I10 Essential (primary) hypertension: Secondary | ICD-10-CM | POA: Diagnosis not present

## 2018-11-02 DIAGNOSIS — Z791 Long term (current) use of non-steroidal anti-inflammatories (NSAID): Secondary | ICD-10-CM

## 2018-11-02 DIAGNOSIS — Z79899 Other long term (current) drug therapy: Secondary | ICD-10-CM

## 2018-11-02 DIAGNOSIS — Z7901 Long term (current) use of anticoagulants: Secondary | ICD-10-CM

## 2018-11-02 NOTE — Progress Notes (Signed)
MRN : [id number]  Ana Cruz is a 85 y.o. (1933/07/26) female who presents with chief complaint of  Chief Complaint  Patient presents with  . Follow-up    12-70month follow up  .  History of Present Illness:     The patient returns to the office for followup evaluation regarding leg swelling.  The swelling has persisted but with the lymph pump the patient states the swelling is much better controlled. The pain associated with swelling is essentially eliminated. There have not been any interval development of a ulcerations or wounds.  No episodes of cellulitis or infection over the past 12 months  The patient denies problems with the pump, noting it is working well and the leggings are in good condition.  Since the previous visit the patient has been wearing graduated compression stockings.   Patient stated the lymph pump was not helpful and she sent it back  Current Meds  Medication Sig  . acetaminophen (TYLENOL) 500 MG tablet Take 500 mg by mouth every 6 (six) hours as needed (As needed for pain).  [name] amoxicillin (AMOXIL) 500 MG capsule Take 500 mg by mouth. Take four tablets prior to dental work.  . Artificial Tear Ointment (EYE LUBRICANT OP) Apply 1 drop to eye 2 (two) times daily.  . Cholecalciferol (VITAMIN D3) 1000 UNITS CAPS Take 1,000 Units by mouth daily.   [name] levothyroxine (SYNTHROID, LEVOTHROID) 88 MCG tablet Take 1 tablet (88 mcg total) by mouth daily before breakfast.  . pravastatin (PRAVACHOL) 20 MG tablet TAKE 1 TABLET BY MOUTH EVERY DAY  . telmisartan (MICARDIS) 40 MG tablet Take 80 mg by mouth daily.   . vitamin B-12 (CYANOCOBALAMIN) 1000 MCG tablet Take 1 tablet (1,000 mcg total) by mouth daily.  [name] warfarin (COUMADIN) 1 MG tablet Take 1 mg by mouth daily.     Past Medical History:  Diagnosis Date  . allergic rhinitis   . Atrial fibrillation ([place])   . Chronic kidney disease, stage I    mild, did improve with cessation of NSAIDs  . Elbow fracture,  left    repaired Jan 2000  . Hyperlipidemia   . Hypertension   . hypothyroidism    medicated since age 42, no prior surgery  . 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 ABDOMINAL HYSTERECTOMY  1979  . APPENDECTOMY  1969  . BILATERAL OOPHORECTOMY     squential  . BLADDER REPAIR  1980  . OVARIAN CYST REMOVAL  April 1969  . REPLACEMENT TOTAL KNEE BILATERAL  2009  . reverse shoulder surgery  04/05/2016  . ROTATOR CUFF REPAIR  Jan 2000  . ROTATOR CUFF REPAIR  2000    Social History Social History   Tobacco Use  . Smoking status: Never Smoker  . Smokeless tobacco: Never Used  Substance Use Topics  . Alcohol use: Yes  . Drug use: No    Family History Family History  Problem Relation Age of Onset  . Hypertension Mother   . Dementia Mother   . Heart disease Father 92       MI  . Cancer Paternal Grandfather 54       colon  . Breast cancer Neg Hx     Allergies  Allergen Reactions  . Chocolate Flavor Hives  . Clindamycin/Lincomycin     Tongue swelled,  Throat felt funny  . Onion Swelling  Throat swelling  . Other Hives    Raw Onions, Spring Time causes eye itching and runny nose. *Pt. Also Had Large Blisters from Tourniquets applied to her legs during her knee replacement surgery*  . Tape   . Hctz [Hydrochlorothiazide] Rash    rash     REVIEW OF SYSTEMS (Negative unless checked)  Constitutional: [] Weight loss  [] Fever  [] Chills Cardiac: [] Chest pain   [] Chest pressure   [] Palpitations   [] Shortness of breath when laying flat   [] Shortness of breath with exertion. Vascular:  [] Pain in legs with walking   [] Pain in legs at rest  [] History of DVT   [] Phlebitis   [x] Swelling in legs   [x] Varicose veins   [] Non-healing ulcers Pulmonary:   [] Uses home oxygen   [] Productive cough   [] Hemoptysis   [] Wheeze  [] COPD   [] Asthma Neurologic:  [] Dizziness   [] Seizures   [] History of  stroke   [] History of TIA  [] Aphasia   [] Vissual changes   [] Weakness or numbness in arm   [] Weakness or numbness in leg Musculoskeletal:   [] Joint swelling   [x] Joint pain   [x] Low back pain Hematologic:  [] Easy bruising  [] Easy bleeding   [] Hypercoagulable state   [] Anemic Gastrointestinal:  [] Diarrhea   [] Vomiting  [] Gastroesophageal reflux/heartburn   [] Difficulty swallowing. Genitourinary:  [] Chronic kidney disease   [] Difficult urination  [] Frequent urination   [] Blood in urine Skin:  [] Rashes   [] Ulcers  Psychological:  [] History of anxiety   []  History of major depression.  Physical Examination  Vitals:   11/02/18 1308  BP: (!) 148/73  Pulse: 64  Resp: 16  Weight: 171 lb 12.8 oz (77.9 kg)  Height: 5\' 3"  (1.6 m)   Body mass index is 30.43 kg/m. Gen: WD/WN, NAD Head: [place]/AT, No temporalis wasting.  Ear/Nose/Throat: Hearing grossly intact, nares w/o erythema or drainage Eyes: PER, EOMI, sclera nonicteric.  Neck: Supple, no large masses.   Pulmonary:  Good air movement, no audible wheezing bilaterally, no use of accessory muscles.  Cardiac: RRR, no JVD Vascular: scattered varicosities present bilaterally.  Moderate venous stasis changes to the legs bilaterally.  2+ soft pitting edema Vessel Right Left  Radial Palpable Palpable  PT Palpable Palpable  DP Palpable Palpable  Gastrointestinal: Non-distended. No guarding/no peritoneal signs.  Musculoskeletal: M/S 5/5 throughout.  No deformity or atrophy.  Neurologic: CN 2-12 intact. Symmetrical.  Speech is fluent. Motor exam as listed above. Psychiatric: Judgment intact, Mood & affect appropriate for pt's clinical situation. Dermatologic: Venous rashes no ulcers noted.  No changes consistent with cellulitis. Lymph : No lichenification or skin changes of chronic lymphedema.  CBC Lab Results  Component Value Date   WBC 5.8 06/17/2018   HGB 10.7 (A) 06/17/2018   HCT 33 (A) 06/17/2018   MCV 95.4 12/04/2017   PLT 268 06/17/2018     BMET    Component Value Date/Time   NA 141 06/26/2018 1012   NA 138 06/17/2018   NA 137 06/17/2014 1454   K 4.4 06/26/2018 1012   K 4.1 06/17/2014 1454   CL 106 06/26/2018 1012   CL 105 06/17/2014 1454   CO2 26 06/26/2018 1012   CO2 23 06/17/2014 1454   GLUCOSE 87 06/26/2018 1012   GLUCOSE 131 (H) 06/17/2014 1454   BUN 32 (H) 06/26/2018 1012   BUN 38 (A) 06/17/2018   BUN 31 (H) 06/17/2014 1454   CREATININE 1.18 (H) 06/26/2018 1012   CALCIUM 9.5 06/26/2018 1012   CALCIUM 9.0 06/17/2014  [place] (L) 12/04/2017 1312   GFRNONAA 40 (L) 06/17/2014 1454   GFRAA 37 (L) 12/04/2017 1312   GFRAA 48 (L) 06/17/2014 1454   CrCl cannot be calculated (Patient's most recent lab result is older than the maximum 21 days allowed.).  COAG No results found for: INR, PROTIME  Radiology No results found.    Assessment/Plan 1. Chronic venous insufficiency No surgery or intervention at this point in time.    I have had a long discussion with the patient regarding venous insufficiency and why it  causes symptoms. I have discussed with the patient the chronic skin changes that accompany venous insufficiency and the long term sequela such as infection and ulceration.  Patient will begin wearing graduated compression stockings class 1 (20-30 mmHg) or compression wraps on a daily basis a prescription was given. The patient will put the stockings on first thing in the morning and removing them in the evening. The patient is instructed specifically not to sleep in the stockings.    In addition, behavioral modification including several periods of elevation of the lower extremities during the day will be continued. I have demonstrated that proper elevation is a position with the ankles at heart level.  The patient is instructed to begin routine exercise, especially walking on a daily basis   2. Lymphedema No surgery or intervention at this point in time.    I have reviewed my discussion  with the patient regarding venous insufficiency and secondary lymph edema and why it  causes symptoms. I have discussed with the patient the chronic skin changes that accompany these problems and the long term sequela such as ulceration and infection.  Patient will continue wearing graduated compression stockings class 1 (20-30 mmHg) on a daily basis a prescription was given to the patient to keep this updated. The patient will  put the stockings on first thing in the morning and removing them in the evening. The patient is instructed specifically not to sleep in the stockings.  In addition, behavioral modification including elevation during the day will be continued.  Diet and salt restriction was also discussed.  Previous duplex ultrasound of the lower extremities shows normal deep venous system, superficial reflux was not present.   Following the review of the ultrasound the patient will follow up in PRN.    3. Essential hypertension Continue antihypertensive medications as already ordered, these medications have been reviewed and there are no changes at this time.   4. Paroxysmal atrial fibrillation (HCC) Continue antiarrhythmia medications as already ordered, these medications have been reviewed and there are no changes at this time.  Continue anticoagulation as ordered by Cardiology Service   5. Spinal stenosis of lumbar region with neurogenic claudication Continue NSAID medications as already ordered, these medications have been reviewed and there are no changes at this time.  Continued activity and therapy was stressed.     Hortencia Pilar, MD  11/02/2018 1:31 PM

## 2018-11-04 ENCOUNTER — Encounter (INDEPENDENT_AMBULATORY_CARE_PROVIDER_SITE_OTHER): Payer: Self-pay | Admitting: Vascular Surgery

## 2018-11-05 ENCOUNTER — Other Ambulatory Visit: Payer: Self-pay | Admitting: Internal Medicine

## 2018-11-16 ENCOUNTER — Telehealth: Payer: Self-pay

## 2018-11-16 ENCOUNTER — Other Ambulatory Visit: Payer: Self-pay

## 2018-11-16 ENCOUNTER — Ambulatory Visit (INDEPENDENT_AMBULATORY_CARE_PROVIDER_SITE_OTHER): Payer: Medicare Other

## 2018-11-16 DIAGNOSIS — Z0000 Encounter for general adult medical examination without abnormal findings: Secondary | ICD-10-CM | POA: Diagnosis not present

## 2018-11-16 NOTE — Telephone Encounter (Signed)
Called patient for AWV at agreed upon time. No answer. Left message with my direct dial number to call back.

## 2018-11-16 NOTE — Patient Instructions (Addendum)
  Ms. Test , Thank you for taking time to come for your Medicare Wellness Visit. I appreciate your ongoing commitment to your health goals. Please review the following plan we discussed and let me know if I can assist you in the future.   These are the goals we discussed: Goals      Patient Stated   . Increase physical activity (pt-stated)     Water aerobics for exercise when facility opens       This is a list of the screening recommended for you and due dates:  Health Maintenance  Topic Date Due  . Tetanus Vaccine  09/20/2018  . Flu Shot  01/02/2019  . DEXA scan (bone density measurement)  Completed  . Pneumonia vaccines  Completed

## 2018-11-16 NOTE — Progress Notes (Addendum)
Subjective:   Ana Cruz is a 85 y.o. female who presents for Medicare Annual (Subsequent) preventive examination.  Review of Systems:  No ROS.  Medicare Wellness Virtual Visit.  Visual/audio telehealth visit, UTA vital signs.   See social history for additional risk factors.   Cardiac Risk Factors include: advanced age (>71men, >64 women);hypertension     Objective:     Vitals: There were no vitals taken for this visit.  There is no height or weight on file to calculate BMI.  Advanced Directives 11/16/2018 12/04/2017 09/11/2017 01/20/2017 09/10/2016 11/16/2015 07/06/2015  Does Patient Have a Medical Advance Directive? Yes No Yes Yes Yes Yes No  Type of Paramedic of [place];Living will - [place];Living will [place];Living will [place];Living will Living will -  Does patient want to make changes to medical advance directive? No - Patient declined - No - Patient declined - No - Patient declined No - Patient declined -  Copy of [place] in Chart? No - copy requested - No - copy requested - No - copy requested Yes -  Would patient like information on creating a medical advance directive? - No - Patient declined - - - - No - patient declined information    Tobacco Social History   Tobacco Use  Smoking Status Never Smoker  Smokeless Tobacco Never Used     Counseling given: Not Answered   Clinical Intake:                       Past Medical History:  Diagnosis Date  . allergic rhinitis   . Atrial fibrillation ([place])   . Chronic kidney disease, stage I    mild, did improve with cessation of NSAIDs  . Elbow fracture, left    repaired Jan 2000  . Hyperlipidemia   . Hypertension   . hypothyroidism    medicated since age 48, no prior surgery  . Spinal stenosis    has L5 disk herniation with nerve root displacement   Past Surgical History:  Procedure  Laterality Date  . ABDOMINAL HYSTERECTOMY  1979  . APPENDECTOMY  1969  . BILATERAL OOPHORECTOMY     squential  . BLADDER REPAIR  1980  . OVARIAN CYST REMOVAL  April 1969  . REPLACEMENT TOTAL KNEE BILATERAL  2009  . reverse shoulder surgery  04/05/2016  . ROTATOR CUFF REPAIR  Jan 2000  . ROTATOR CUFF REPAIR  2000   Family History  Problem Relation Age of Onset  . Hypertension Mother   . Dementia Mother   . Heart disease Father 1       MI  . Cancer Paternal Grandfather 53       colon  . Breast cancer Neg Hx    Social History   Socioeconomic History  . Marital status: Married    Spouse name: Not on file  . Number of children: Not on file  . Years of education: Not on file  . Highest education level: Not on file  Occupational History  . Not on file  Social Needs  . Financial resource strain: Not hard at all  . Food insecurity    Worry: Never true    Inability: Never true  . Transportation needs    Medical: No    Non-medical: No  Tobacco Use  . Smoking status: Never Smoker  . Smokeless tobacco: Never Used  Substance and Sexual Activity  .  Alcohol use: Yes  . Drug use: No  . Sexual activity: Not on file  Lifestyle  . Physical activity    Days per week: Not on file    Minutes per session: Not on file  . Stress: Not on file  Relationships  . Social Herbalist on phone: Not on file    Gets together: Not on file    Attends religious service: Not on file    Active member of club or organization: Not on file    Attends meetings of clubs or organizations: Not on file    Relationship status: Not on file  Other Topics Concern  . Not on file  Social History Narrative   Married    [place]     Outpatient Encounter Medications as of 11/16/2018  Medication Sig  . acetaminophen (TYLENOL) 500 MG tablet Take 500 mg by mouth every 6 (six) hours as needed (As needed for pain).  [name] amoxicillin (AMOXIL) 500 MG capsule Take 500 mg by mouth. Take four  tablets prior to dental work.  . Artificial Tear Ointment (EYE LUBRICANT OP) Apply 1 drop to eye 2 (two) times daily.  . Cholecalciferol (VITAMIN D3) 1000 UNITS CAPS Take 1,000 Units by mouth daily.   [name] levothyroxine (SYNTHROID, LEVOTHROID) 88 MCG tablet Take 1 tablet (88 mcg total) by mouth daily before breakfast.  . pravastatin (PRAVACHOL) 20 MG tablet TAKE 1 TABLET BY MOUTH EVERY DAY  . telmisartan (MICARDIS) 40 MG tablet Take 80 mg by mouth daily.   . vitamin B-12 (CYANOCOBALAMIN) 1000 MCG tablet Take 1 tablet (1,000 mcg total) by mouth daily.  [name] warfarin (COUMADIN) 1 MG tablet Take 1 mg by mouth daily.    No facility-administered encounter medications on file as of 11/16/2018.     Activities of Daily Living In your present state of health, do you have any difficulty performing the following activities: 11/16/2018  Hearing? N  Vision? N  Difficulty concentrating or making decisions? N  Walking or climbing stairs? Y  Comment Unsteady gait. Ambulates with cane. She avoids stairs.  Dressing or bathing? N  Doing errands, shopping? N  Preparing Food and eating ? N  Using the Toilet? N  In the past six months, have you accidently leaked urine? N  Do you have problems with loss of bowel control? N  Managing your Medications? N  Managing your Finances? N  Housekeeping or managing your Housekeeping? N  Some recent data might be hidden    Patient Care Team: Crecencio Mc, MD as PCP - General (Internal Medicine)    Assessment:   This is a routine wellness examination for Ana Cruz.  I connected with patient 11/16/18 at 11:30 AM EDT by a video/audio enabled telemedicine application and verified that I am speaking with the correct person using two identifiers. Patient stated full name and DOB. Patient gave permission to continue with virtual visit. Patient's location was at home and Nurse's location was at [place] office.   Health Screenings  Mammogram - 05/2018 Colonoscopy - 04/2002  Bone Density - 09/2015 Glaucoma -none Hearing -demonstrates normal hearing during visit. Hemoglobin A1C - 06/2018 Cholesterol - 06/2018 Dental- every 6 monoths Vision- visits within the last 12 months.  Social  Alcohol intake - yes      Smoking history- never    Smokers in home? none Illicit drug use? none Exercise - walking Diet - Regular; no leafy greens Sexually Active -not currently BMI- discussed the importance of  a healthy diet, water intake and the benefits of aerobic exercise.  Educational material provided.   Safety  Patient feels safe at home- yes Patient does have smoke detectors at home- yes Patient does wear sunscreen or protective clothing when in direct sunlight -yes Patient does wear seat belt when in a moving vehicle -yes  Covid-19 precautions and sickness symptoms discussed.   Activities of Daily Living Patient denies needing assistance with: driving, household chores, feeding themselves, getting from bed to chair, getting to the toilet, bathing/showering, dressing, managing money, or preparing meals.   Depression Screen Patient denies losing interest in daily life, feeling hopeless, or crying easily over simple problems.   Medication-taking as directed and without issues.   Fall Screen Patient denies being afraid of falling or falling in the last year. Ambulates with cane.  Memory Screen Patient is alert.  Patient denies difficulty focusing, concentrating or misplacing items. Correctly identified the president of the [place], season and recall. Patient likes to read, stitch and quilt for brain stimulation.  Immunizations The following Immunizations were discussed: Influenza, shingles, pneumonia, and tetanus.   Other Providers Patient Care Team: Crecencio Mc, MD as PCP - General (Internal Medicine)  Exercise Activities and Dietary recommendations Current Exercise Habits: Home exercise routine, Time (Minutes): 20, Frequency (Times/Week): 3, Weekly  Exercise (Minutes/Week): 60, Intensity: Mild  Goals      Patient Stated   . Increase physical activity (pt-stated)     Water aerobics for exercise when facility opens       Fall Risk Fall Risk  11/16/2018 01/30/2018 09/11/2017 09/10/2016 08/20/2016  Falls in the past year? 0 No Yes No No  Number falls in past yr: - - 1 - -  Injury with Fall? - - No - -  Follow up - - Falls prevention discussed;Education provided - -   Depression Screen PHQ 2/9 Scores 11/16/2018 01/30/2018 09/11/2017 09/10/2016  PHQ - 2 Score 0 0 0 0     Cognitive Function     6CIT Screen 11/16/2018 09/10/2016  What Year? 0 points 0 points  What month? 0 points 0 points  What time? 0 points 0 points  Count back from 20 0 points 0 points  Months in reverse 0 points 0 points  Repeat phrase 0 points 0 points  Total Score 0 0    Immunization History  Administered Date(s) Administered  . Influenza Split 03/31/2012, 04/01/2013, 02/22/2014  . Influenza Whole 03/05/2011  . Influenza, High Dose Seasonal PF 02/20/2016, 03/14/2017  . Influenza,inj,Quad PF,6+ Mos 02/21/2015  . Influenza-Unspecified 03/19/2018  . Pneumococcal Conjugate-13 08/02/2013  . Pneumococcal Polysaccharide-23 04/09/1999, 04/09/2011  . Tdap 09/19/2008  . Zoster 01/20/2007    Screening Tests Health Maintenance  Topic Date Due  . TETANUS/TDAP  09/20/2018  . INFLUENZA VACCINE  01/02/2019  . DEXA SCAN  Completed  . PNA vac Low Risk Adult  Completed      Plan:    End of life planning; Advance aging; Advanced directives discussed.  Copy of current HCPOA/Living Will requested.    I have personally reviewed and noted the following in the patient's chart:   . Medical and social history . Use of alcohol, tobacco or illicit drugs  . Current medications and supplements . Functional ability and status . Nutritional status . Physical activity . Advanced directives . List of other physicians . Hospitalizations, surgeries, and ER visits in  previous 12 months . Vitals . Screenings to include cognitive, depression, and falls . Referrals and appointments  In addition, I have reviewed and discussed with patient certain preventive protocols, quality metrics, and best practice recommendations. A written personalized care plan for preventive services as well as general preventive health recommendations were provided to patient.     OBrien-Blaney, Denisa L, LPN  4/93/2419     I have reviewed the above information and agree with above.   Deborra Medina, MD

## 2018-12-21 ENCOUNTER — Encounter: Payer: Self-pay | Admitting: Internal Medicine

## 2018-12-21 ENCOUNTER — Other Ambulatory Visit: Payer: Self-pay

## 2018-12-21 ENCOUNTER — Ambulatory Visit (INDEPENDENT_AMBULATORY_CARE_PROVIDER_SITE_OTHER): Payer: Medicare Other | Admitting: Internal Medicine

## 2018-12-21 VITALS — BP 138/63 | HR 60 | Wt 170.0 lb

## 2018-12-21 DIAGNOSIS — R202 Paresthesia of skin: Secondary | ICD-10-CM

## 2018-12-21 DIAGNOSIS — E034 Atrophy of thyroid (acquired): Secondary | ICD-10-CM | POA: Diagnosis not present

## 2018-12-21 DIAGNOSIS — N183 Chronic kidney disease, stage 3 unspecified: Secondary | ICD-10-CM

## 2018-12-21 DIAGNOSIS — I10 Essential (primary) hypertension: Secondary | ICD-10-CM

## 2018-12-21 DIAGNOSIS — G2581 Restless legs syndrome: Secondary | ICD-10-CM

## 2018-12-21 DIAGNOSIS — E782 Mixed hyperlipidemia: Secondary | ICD-10-CM

## 2018-12-21 DIAGNOSIS — G6289 Other specified polyneuropathies: Secondary | ICD-10-CM

## 2018-12-21 DIAGNOSIS — R7303 Prediabetes: Secondary | ICD-10-CM | POA: Diagnosis not present

## 2018-12-21 DIAGNOSIS — M25541 Pain in joints of right hand: Secondary | ICD-10-CM

## 2018-12-21 DIAGNOSIS — M25542 Pain in joints of left hand: Secondary | ICD-10-CM

## 2018-12-21 DIAGNOSIS — R200 Anesthesia of skin: Secondary | ICD-10-CM

## 2018-12-21 NOTE — Progress Notes (Signed)
Virtual Visit via [email or web address]  This visit type was conducted due to national recommendations for restrictions regarding the COVID-19 pandemic (e.g. social distancing).  This format is felt to be most appropriate for this patient at this time.  All issues noted in this document were discussed and addressed.  No physical exam was performed (except for noted visual exam findings with Video Visits).   I connected with@ on 12/21/18 at  1:30 PM EDT by a video enabled telemedicine application or telephone and verified that I am speaking with the correct person using two identifiers. Location patient: home Location provider: work or home office Persons participating in the virtual visit: patient, provider  I discussed the limitations, risks, security and privacy concerns of performing an evaluation and management service by telephone and the availability of in person appointments. I also discussed with the patient that there may be a patient responsible charge related to this service. The patient expressed understanding and agreed to proceed.   Reason for visit: 6 month follow up on hypertension, CKS, prediabetes and hyperlipidemia   HPI:  Cc:  She has been experiencing pain in her hands bilaterally involving the small joints and MCPs.  The pain is worse in the morning but aggravated by trying to open  jars. She has CKD and is no longer using NSAIDs,  And has noticed no change with use of  tylenol 1000 mg bid.   She has mild pain of large joints  but has had bilateral knee replacements in 2009  and more recently,  Reverse right shoulder replacement in 2017.    She also notes that since her shoulder surgery she has periodic numbness and tingling of the entire right hand that is brought on by use of a stylus ( for her I PAD) and sewing, which she no longer enjoys .  She has seen Dr Melrose Nakayama  Twice in 2019, for evaluation of neuropathy and dizziness but is frustrated at the lack of communication .   Review of OV  Nov 2019 reports severe neuropathy by EMG studies done Sept 2019.  Balance exercises  Were given and 6 month follow up with is PA was advised but has not been done.   RoS: See pertinent positives and negatives per HPI.  Past Medical History:  Diagnosis Date  . allergic rhinitis   . Atrial fibrillation ([place])   . Chronic kidney disease, stage I    mild, did improve with cessation of NSAIDs  . Elbow fracture, left    repaired Jan 2000  . Hyperlipidemia   . Hypertension   . hypothyroidism    medicated since age 20, no prior surgery  . 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 ABDOMINAL HYSTERECTOMY  1979  . APPENDECTOMY  1969  . BILATERAL OOPHORECTOMY     [place]  . BLADDER REPAIR  1980  . OVARIAN CYST REMOVAL  April 1969  . REPLACEMENT TOTAL KNEE BILATERAL  2009  . reverse shoulder surgery  04/05/2016  . ROTATOR CUFF REPAIR  Jan 2000  . ROTATOR CUFF REPAIR  2000    Family History  Problem Relation Age of Onset  . Hypertension Mother   . Dementia Mother   . Heart disease Father 81       MI  . Cancer Paternal Grandfather 69       colon  . Breast cancer Neg Hx  SOCIAL HX:  reports that she has never smoked. She has never used smokeless tobacco. She reports current alcohol use. She reports that she does not use drugs.   Current Outpatient Medications:  .  acetaminophen (TYLENOL) 500 MG tablet, Take 500 mg by mouth every 6 (six) hours as needed (As needed for pain)., Disp: , Rfl:  .  amoxicillin (AMOXIL) 500 MG capsule, Take 500 mg by mouth. Take four tablets prior to dental work., Disp: , Rfl:  .  Artificial Tear Ointment (EYE LUBRICANT OP), Apply 1 drop to eye 2 (two) times daily., Disp: , Rfl:  .  Cholecalciferol (VITAMIN D3) 1000 UNITS CAPS, Take 1,000 Units by mouth daily. , Disp: , Rfl:  .  levothyroxine (SYNTHROID, LEVOTHROID) 88 MCG tablet, Take 1 tablet (88 mcg total) by  mouth daily before breakfast., Disp: 90 tablet, Rfl: 1 .  pravastatin (PRAVACHOL) 20 MG tablet, TAKE 1 TABLET BY MOUTH EVERY DAY, Disp: 90 tablet, Rfl: 1 .  telmisartan (MICARDIS) 40 MG tablet, Take 80 mg by mouth daily. , Disp: , Rfl:  .  vitamin B-12 (CYANOCOBALAMIN) 1000 MCG tablet, Take 1 tablet (1,000 mcg total) by mouth daily., Disp: 90 tablet, Rfl: 3 .  warfarin (COUMADIN) 1 MG tablet, Take 1 mg by mouth daily. , Disp: , Rfl:   EXAM:  VITALS per patient if applicable:  GENERAL: alert, oriented, appears well and in no acute distress  HEENT: atraumatic, conjunttiva clear, no obvious abnormalities on inspection of external nose and ears  NECK: normal movements of the head and neck  LUNGS: on inspection no signs of respiratory distress, breathing rate appears normal, no obvious gross SOB, gasping or wheezing  CV: no obvious cyanosis  MS: moves all visible extremities without noticeable abnormality  PSYCH/NEURO: pleasant and cooperative, no obvious depression or anxiety, speech and thought processing grossly intact  ASSESSMENT AND PLAN:   Peripheral neuropathy Etiology unclear,  Despite neurology's confirmation of severe neuropathy by studies done Sept 2019.  Likely multifactorial including B12 deficiency and lumbar spinal stenosis .  Balance exercises prescribed by Melrose Nakayama per review of his Nov 2019 OV notes .  Lab Results  Component Value Date   TSH 1.38 06/26/2018   Lab Results  Component Value Date   HGBA1C 6.0 06/26/2018   .No results found for: VITAMINB12    Numbness and tingling in right hand Unclear if the symptoms in right hand are due to progression of the process that caused the bilateral lower extremity neuropathy demonstrated on EMG studies  done by [facility] Sept 2019,  Or a separate etiology given history of reverse shoulder surgery in 2017 of right shoulder.  She denies neck pain.  I have recommended that she obtain a  2nd opinion with [facility]   Following repeat assessment of b12 and thyroid,  Need to consider amyloid   Benign essential hypertension bp is at goal without amlodipine which was stopped dur to recurrent dizziness. Tolerating telmisartan  Arthralgia of both hands Checking ESR, RF and ANA.  Tramadol offered to supplement tylenol but she has deferred,  Recommend trial of heat/cold therapy   CKD (chronic kidney disease) stage 3, GFR 30-59 ml/min (HCC) Renal function has returned to baseline with avoidance of NSAIDs.  She is on an ARB for control of hypertension,, and statin for control of hyperlipidemia. 6 month follow up labs have been ordered and will be forwarded to Dr Candiss Norse, her nephrologist.   Lab Results  Component Value Date   CREATININE 1.18 (  H) 06/26/2018       I discussed the assessment and treatment plan with the patient. The patient was provided an opportunity to ask questions and all were answered. The patient agreed with the plan and demonstrated an understanding of the instructions.   The patient was advised to call back or seek an in-person evaluation if the symptoms worsen or if the condition fails to improve as anticipated.  I provided 40 minutes of non-face-to-face time during this encounter.   Crecencio Mc, MD

## 2018-12-21 NOTE — Progress Notes (Signed)
Pt stated that the only problem she is having is both of her hands hurting and loss of strength. Pt stated that it is getting hard for to open jars.

## 2018-12-22 ENCOUNTER — Telehealth: Payer: Self-pay | Admitting: Internal Medicine

## 2018-12-22 ENCOUNTER — Encounter: Payer: Self-pay | Admitting: Internal Medicine

## 2018-12-22 DIAGNOSIS — R200 Anesthesia of skin: Secondary | ICD-10-CM | POA: Insufficient documentation

## 2018-12-22 DIAGNOSIS — M25541 Pain in joints of right hand: Secondary | ICD-10-CM | POA: Insufficient documentation

## 2018-12-22 NOTE — Assessment & Plan Note (Signed)
Checking ESR, RF and ANA.  Tramadol offered to supplement tylenol but she has deferred,  Recommend trial of heat/cold therapy

## 2018-12-22 NOTE — Assessment & Plan Note (Signed)
Etiology unclear,  Despite neurology's confirmation of severe neuropathy by studies done Sept 2019.  Likely multifactorial including B12 deficiency and lumbar spinal stenosis .  Balance exercises prescribed by Melrose Nakayama per review of his Nov 2019 OV notes .  Lab Results  Component Value Date   TSH 1.38 06/26/2018   Lab Results  Component Value Date   HGBA1C 6.0 06/26/2018   .No results found for: VITAMINB12

## 2018-12-22 NOTE — Assessment & Plan Note (Signed)
Renal function has returned to baseline with avoidance of NSAIDs.  She is on an ARB for control of hypertension,, and statin for control of hyperlipidemia. 6 month follow up labs have been ordered and will be forwarded to Dr Candiss Norse, her nephrologist.   Lab Results  Component Value Date   CREATININE 1.18 (H) 06/26/2018

## 2018-12-22 NOTE — Assessment & Plan Note (Signed)
Unclear if the symptoms in right hand are due to progression of the process that caused the bilateral lower extremity neuropathy demonstrated on EMG studies  done by [facility] Sept 2019,  Or a separate etiology given history of reverse shoulder surgery in 2017 of right shoulder.  She denies neck pain.  I have recommended that she obtain a  2nd opinion with [facility]  Following repeat assessment of b12 and thyroid,  Need to consider amyloid

## 2018-12-22 NOTE — Assessment & Plan Note (Signed)
bp is at goal without amlodipine which was stopped dur to recurrent dizziness. Tolerating telmisartan

## 2018-12-24 ENCOUNTER — Other Ambulatory Visit: Payer: Self-pay

## 2018-12-24 ENCOUNTER — Encounter: Payer: Self-pay | Admitting: Podiatry

## 2018-12-24 ENCOUNTER — Ambulatory Visit (INDEPENDENT_AMBULATORY_CARE_PROVIDER_SITE_OTHER): Payer: Medicare Other | Admitting: Podiatry

## 2018-12-24 VITALS — Temp 98.1°F

## 2018-12-24 DIAGNOSIS — M79676 Pain in unspecified toe(s): Secondary | ICD-10-CM

## 2018-12-24 DIAGNOSIS — L608 Other nail disorders: Secondary | ICD-10-CM

## 2018-12-24 DIAGNOSIS — B351 Tinea unguium: Secondary | ICD-10-CM

## 2018-12-24 DIAGNOSIS — D689 Coagulation defect, unspecified: Secondary | ICD-10-CM

## 2018-12-24 NOTE — Progress Notes (Signed)
Complaint:  Visit Type: Patient returns to my office for continued preventative foot care services. Complaint: Patient states" my nails have grown long and thick and become painful to walk and wear shoes" . The patient presents for preventative foot care services. No changes to ROS.  Patient is taking coumadin.  Podiatric Exam: Vascular: dorsalis pedis and posterior tibial pulses are palpable bilateral. Capillary return is immediate. Temperature gradient is WNL. Skin turgor WNL  Sensorium: Normal Semmes Weinstein monofilament test. Normal tactile sensation bilaterally. Nail Exam: Pt has thick disfigured discolored nails with subungual debris noted bilateral entire nail hallux through fifth toenails.  Pincer toenail right hallux. Ulcer Exam: There is no evidence of ulcer or pre-ulcerative changes or infection. Orthopedic Exam: Muscle tone and strength are WNL. No limitations in general ROM. No crepitus or effusions noted. Foot type and digits show no abnormalities. HAV  B/L.  Hammer toe B/L. Skin:  . No infection or ulcers.     Diagnosis:  Onychomycosis, , Pain in right toe, pain in left toes   Treatment & Plan Procedures and Treatment: Consent by patient was obtained for treatment procedures.   Debridement of mycotic and hypertrophic toenails, 1 through 5 bilateral and clearing of subungual debris. No ulceration, no infection noted. Debride ingrown nail right hallux.  Neosporin/DSD. Return Visit-Office Procedure: Patient instructed to return to the office for a follow up visit 10 weeks  for continued evaluation and treatment.    Gardiner Barefoot DPM

## 2019-01-05 ENCOUNTER — Other Ambulatory Visit (INDEPENDENT_AMBULATORY_CARE_PROVIDER_SITE_OTHER): Payer: Medicare Other

## 2019-01-05 ENCOUNTER — Other Ambulatory Visit: Payer: Self-pay

## 2019-01-05 DIAGNOSIS — M25541 Pain in joints of right hand: Secondary | ICD-10-CM

## 2019-01-05 DIAGNOSIS — E782 Mixed hyperlipidemia: Secondary | ICD-10-CM | POA: Diagnosis not present

## 2019-01-05 DIAGNOSIS — R200 Anesthesia of skin: Secondary | ICD-10-CM

## 2019-01-05 DIAGNOSIS — G2581 Restless legs syndrome: Secondary | ICD-10-CM

## 2019-01-05 DIAGNOSIS — M25542 Pain in joints of left hand: Secondary | ICD-10-CM

## 2019-01-05 DIAGNOSIS — I10 Essential (primary) hypertension: Secondary | ICD-10-CM

## 2019-01-05 DIAGNOSIS — R7303 Prediabetes: Secondary | ICD-10-CM | POA: Diagnosis not present

## 2019-01-05 DIAGNOSIS — E034 Atrophy of thyroid (acquired): Secondary | ICD-10-CM

## 2019-01-05 DIAGNOSIS — N183 Chronic kidney disease, stage 3 unspecified: Secondary | ICD-10-CM

## 2019-01-05 DIAGNOSIS — R202 Paresthesia of skin: Secondary | ICD-10-CM

## 2019-01-05 LAB — COMPREHENSIVE METABOLIC PANEL
ALT: 11 U/L (ref 0–35)
AST: 16 U/L (ref 0–37)
Albumin: 4 g/dL (ref 3.5–5.2)
Alkaline Phosphatase: 102 U/L (ref 39–117)
BUN: 43 mg/dL — ABNORMAL HIGH (ref 6–23)
CO2: 24 mEq/L (ref 19–32)
Calcium: 9.3 mg/dL (ref 8.4–10.5)
Chloride: 108 mEq/L (ref 96–112)
Creatinine, Ser: 1.08 mg/dL (ref 0.40–1.20)
GFR: 48.39 mL/min — ABNORMAL LOW (ref >60.00)
Glucose, Bld: 99 mg/dL (ref 70–99)
Potassium: 4.5 mEq/L (ref 3.5–5.1)
Sodium: 140 mEq/L (ref 135–145)
Total Bilirubin: 0.4 mg/dL (ref 0.2–1.2)
Total Protein: 6.8 g/dL (ref 6.0–8.3)

## 2019-01-05 LAB — LIPID PANEL
Cholesterol: 216 mg/dL — ABNORMAL HIGH (ref 0–200)
HDL: 70.5 mg/dL (ref >39.00)
LDL Cholesterol: 124 mg/dL — ABNORMAL HIGH (ref 0–99)
NonHDL: 145.65
Total CHOL/HDL Ratio: 3
Triglycerides: 106 mg/dL (ref 0.0–149.0)
VLDL: 21.2 mg/dL (ref 0.0–40.0)

## 2019-01-05 LAB — CBC WITH DIFFERENTIAL/PLATELET
Basophils Absolute: 0 10*3/uL (ref 0.0–0.1)
Basophils Relative: 0.3 % (ref 0.0–3.0)
Eosinophils Absolute: 0.2 10*3/uL (ref 0.0–0.7)
Eosinophils Relative: 4.1 % (ref 0.0–5.0)
HCT: 35.4 % — ABNORMAL LOW (ref 36.0–46.0)
Hemoglobin: 11.6 g/dL — ABNORMAL LOW (ref 12.0–15.0)
Lymphocytes Relative: 34.2 % (ref 12.0–46.0)
Lymphs Abs: 1.7 10*3/uL (ref 0.7–4.0)
MCHC: 32.7 g/dL (ref 30.0–36.0)
MCV: 94.6 fl (ref 78.0–100.0)
Monocytes Absolute: 0.4 10*3/uL (ref 0.1–1.0)
Monocytes Relative: 8.7 % (ref 3.0–12.0)
Neutro Abs: 2.7 10*3/uL (ref 1.4–7.7)
Neutrophils Relative %: 52.7 % (ref 43.0–77.0)
Platelets: 259 10*3/uL (ref 150.0–400.0)
RBC: 3.74 Mil/uL — ABNORMAL LOW (ref 3.87–5.11)
RDW: 14.8 % (ref 11.5–15.5)
WBC: 5.1 10*3/uL (ref 4.0–10.5)

## 2019-01-05 LAB — SEDIMENTATION RATE: Sed Rate: 47 mm/hr — ABNORMAL HIGH (ref 0–30)

## 2019-01-05 LAB — TSH: TSH: 0.82 u[IU]/mL (ref 0.35–4.50)

## 2019-01-05 LAB — HEMOGLOBIN A1C: Hgb A1c MFr Bld: 5.8 % (ref 4.6–6.5)

## 2019-01-05 LAB — VITAMIN B12: Vitamin B-12: 1152 pg/mL — ABNORMAL HIGH (ref 211–911)

## 2019-01-06 LAB — IRON,TIBC AND FERRITIN PANEL
%SAT: 41 % (calc) (ref 16–45)
Ferritin: 194 ng/mL (ref 16–288)
Iron: 109 ug/dL (ref 45–160)
TIBC: 263 mcg/dL (calc) (ref 250–450)

## 2019-01-06 LAB — PTH, INTACT AND CALCIUM
Calcium: 9.3 mg/dL (ref 8.6–10.4)
PTH: 40 pg/mL (ref 14–64)

## 2019-01-06 LAB — RHEUMATOID FACTOR: Rhuematoid fact SerPl-aCnc: <14 IU/mL (ref <14)

## 2019-01-06 LAB — ANA W/REFLEX IF POSITIVE: Anti Nuclear Antibody (ANA): NEGATIVE

## 2019-02-02 ENCOUNTER — Other Ambulatory Visit: Payer: Self-pay

## 2019-02-02 DIAGNOSIS — E039 Hypothyroidism, unspecified: Secondary | ICD-10-CM

## 2019-02-02 MED ORDER — LEVOTHYROXINE SODIUM 88 MCG PO TABS: 88.0000 ug | ORAL_TABLET | Freq: Every day | ORAL | 1 refills | Status: DC

## 2019-02-17 ENCOUNTER — Encounter: Payer: Self-pay | Admitting: Neurology

## 2019-02-22 ENCOUNTER — Ambulatory Visit (INDEPENDENT_AMBULATORY_CARE_PROVIDER_SITE_OTHER): Payer: Medicare Other | Admitting: Neurology

## 2019-02-22 ENCOUNTER — Other Ambulatory Visit (INDEPENDENT_AMBULATORY_CARE_PROVIDER_SITE_OTHER): Payer: Medicare Other

## 2019-02-22 ENCOUNTER — Encounter: Payer: Self-pay | Admitting: Neurology

## 2019-02-22 ENCOUNTER — Other Ambulatory Visit: Payer: Self-pay

## 2019-02-22 VITALS — BP 136/70 | HR 72 | Ht 63.0 in | Wt 172.0 lb

## 2019-02-22 DIAGNOSIS — R202 Paresthesia of skin: Secondary | ICD-10-CM

## 2019-02-22 DIAGNOSIS — G629 Polyneuropathy, unspecified: Secondary | ICD-10-CM

## 2019-02-22 DIAGNOSIS — M48061 Spinal stenosis, lumbar region without neurogenic claudication: Secondary | ICD-10-CM | POA: Diagnosis not present

## 2019-02-22 DIAGNOSIS — M5416 Radiculopathy, lumbar region: Secondary | ICD-10-CM | POA: Diagnosis not present

## 2019-02-22 NOTE — Progress Notes (Signed)
[facility] Clinic Note - Initial Visit   Date: 02/22/19  Ana Cruz MRN: [id number] DOB: 13-Mar-1934   Dear Dr. Derrel Nip:  Thank you for your kind referral of Ana Cruz for consultation of bilateral hand and feet paresthesias. Although her history is well known to you, please allow [place] to reiterate it for the purpose of our medical record. The patient was accompanied to the clinic by self.    History of Present Illness: Ana Cruz is a 85 y.o. right-handed female with hyperlipidemia, hypertension, atrial fibrillation, and hypothyroidism presenting for evaluation of bilateral hand and feet numbness.   She was diagnosed with peripheral neuropathy by Dr. Melrose Nakayama in 2019.  She has numbness of the feet and lower legs for the past 2 years.  She denies any tingling, sharp, or burning pain.  Her balance is poor and she has been using a cane for the past several years.  Fortunately, she has not suffered any falls.  She complains of numbness in the hands since July 2020.  It involves the fingers, which is worse on the right.  It occurs daily and worse with activity.  She has difficulty writing, holding a phone, or any fine motor tasks. She is frequently dropping things.  For many years, she has been quilting and cross stitching.    She also complains of a lot of achiness and stiffness in her joints, such as when making a fist.  Out-side paper records, electronic medical record, and images have been reviewed where available and summarized as:  Lab Results  Component Value Date   HGBA1C 5.8 01/05/2019   Lab Results  Component Value Date   [id number] (H) 01/05/2019   Lab Results  Component Value Date   TSH 0.82 01/05/2019   Lab Results  Component Value Date   ESRSEDRATE 47 (H) 01/05/2019    Past Medical History:  Diagnosis Date   allergic rhinitis    Atrial fibrillation ([place])    Chronic kidney disease, stage I    mild,  did improve with cessation of NSAIDs   Elbow fracture, left    repaired Jan 2000   Hyperlipidemia    Hypertension    hypothyroidism    medicated since age 85,  no prior surgery   Spinal stenosis    has L5 disk herniation with nerve root displacement    Past Surgical History:  Procedure Laterality Date   [place] CYST REMOVAL  April 1969   REPLACEMENT TOTAL KNEE BILATERAL  2009   reverse shoulder surgery  04/05/2016   ROTATOR CUFF REPAIR  Jan 2000   ROTATOR CUFF REPAIR  2000     Medications:  Outpatient Encounter Medications as of 02/22/2019  Medication Sig   acetaminophen (TYLENOL) 500 MG tablet Take 500 mg by mouth every 6 (six) hours as needed (As needed for pain).   Artificial Tear Ointment (EYE LUBRICANT OP) Apply 1 drop to eye 2 (two) times daily.   Cholecalciferol (VITAMIN D3) 1000 UNITS CAPS Take 1,000 Units by mouth daily.    levothyroxine (SYNTHROID) 88 MCG tablet Take 1 tablet (88 mcg total) by mouth daily before breakfast.   pravastatin (PRAVACHOL) 20 MG tablet TAKE 1 TABLET BY MOUTH EVERY DAY   telmisartan (MICARDIS) 40 MG tablet Take 80 mg by mouth daily.    vitamin B-12 (CYANOCOBALAMIN) 1000  MCG tablet Take 1 tablet (1,000 mcg total) by mouth daily.   warfarin (COUMADIN) 1 MG tablet Take 1 mg by mouth daily.    amoxicillin (AMOXIL) 500 MG capsule Take 500 mg by mouth. Take four tablets prior to dental work.   No facility-administered encounter medications on file as of 02/22/2019.     Allergies:  Allergies  Allergen Reactions   Amlodipine     dizziness   Chocolate Flavor Hives   Clindamycin/Lincomycin     Tongue swelled,  Throat felt funny   Onion Swelling    Throat swelling   Other Hives    Raw Onions, Spring Time causes eye itching and runny nose. *Pt. Also Had Large Blisters from Tourniquets applied to her legs  during her knee replacement surgery*   Tape    Hctz [Hydrochlorothiazide] Rash    rash    Family History: Family History  Problem Relation Age of Onset   Hypertension Mother    Dementia Mother    Heart disease Father 86       MI   Cancer Paternal Grandfather 15       colon   Breast cancer Neg Hx     Social History: Social History   Tobacco Use   Smoking status: Never Smoker   Smokeless tobacco: Never Used  Substance Use Topics   Alcohol use: Yes   Drug use: No   Social History   Social History Narrative   Married    Lives [place]    Right handed   One story  Volunteered in the hospital.  Review of Systems:  CONSTITUTIONAL: No fevers, chills, night sweats, or weight loss.   EYES: No visual changes or eye pain ENT: No hearing changes.  No history of nose bleeds.   RESPIRATORY: No cough, wheezing and shortness of breath.   CARDIOVASCULAR: Negative for chest pain, and palpitations.   GI: Negative for abdominal discomfort, blood in stools or black stools.  No recent change in bowel habits.   GU:  No history of incontinence.   MUSCLOSKELETAL: No history of joint pain or swelling.  No myalgias.   SKIN: Negative for lesions, rash, and itching.   HEMATOLOGY/ONCOLOGY: Negative for prolonged bleeding, bruising easily, and swollen nodes.  No history of cancer.   ENDOCRINE: Negative for cold or heat intolerance, polydipsia or goiter.   PSYCH:  No depression or anxiety symptoms.   NEURO: As Above.   Vital Signs:  BP 136/70    Pulse 72    Ht 5\' 3"  (1.6 m)    Wt 172 lb (78 kg)    SpO2 96%    BMI 30.47 kg/m    General Medical Exam:   General:  Well appearing, comfortable.   Eyes/ENT: see cranial nerve examination.   Neck:   No carotid bruits. Respiratory:  Clear to auscultation, good air entry bilaterally.   Cardiac:  Regular rate and rhythm, no murmur.   Extremities:  No deformities, edema, or skin discoloration.  Skin:  No rashes or  lesions.  Neurological Exam: MENTAL STATUS including orientation to time, place, person, recent and remote memory, attention span and concentration, language, and fund of knowledge is normal.  Speech is not dysarthric.  CRANIAL NERVES: II:  No visual field defects.   III-IV-VI: Pupils equal round and reactive to light.  Normal conjugate, extra-ocular eye movements in all directions of gaze.  No nystagmus.  No ptosis.   V:  Normal facial sensation.    VII:  Normal facial symmetry and movements.   VIII:  Normal hearing and vestibular function.   IX-X:  Normal palatal movement.   XI:  Normal shoulder shrug and head rotation.   XII:  Normal tongue strength and range of motion, no deviation or fasciculation.  MOTOR:  Bilateral ABP atrophy, no fasciculations or abnormal movements.  No pronator drift.   Upper Extremity:  Right  Left  Deltoid  5/5   5/5   Biceps  5/5   5/5   Triceps  5/5   5/5   Infraspinatus 5/5  5/5  Medial pectoralis 5/5  5/5  Wrist extensors  5/5   5/5   Wrist flexors  5/5   5/5   Finger extensors  5/5   5/5   Finger flexors  5/5   5/5   Dorsal interossei  5/5   5/5   Abductor pollicis  4/5   4/5   Tone (Ashworth scale)  0  0   Lower Extremity:  Right  Left  Hip flexors  5/5   5/5   Hip extensors  5/5   5/5   Adductor 5/5  5/5  Abductor 5/5  5/5  Knee flexors  5/5   5/5   Knee extensors  5/5   5/5   Dorsiflexors  5/5   5/5   Plantarflexors  5/5   5/5   Toe extensors  5/5   5/5   Toe flexors  5-/5   5-/5   Tone (Ashworth scale)  0  0   MSRs:  Right        Left                  brachioradialis 2+  2+  biceps 2+  2+  triceps 2+  2+  patellar 3+  3+  ankle jerk 1+  1+  Hoffman no  no  plantar response down  down   SENSORY:  Vibration is reduced distal to ankles bilaterally and absent at the great toe; pin prick and temperature is reduced distal to mid-calf bilaterally. Sensation in the hands is intact.  Rhomberg sign is positive.  COORDINATION/GAIT:  Normal finger-to- nose-finger.  Intact rapid alternating movements bilaterally.   Gait wide-based, assisted with cane and appear stable.   IMPRESSION: 1. Bilateral hand paresthesias are most likely due to carpal tunnel syndrome, less likely neuropathy.  Exam shows bilateral ABP atrophy and she has a long history of quilting and cross stitching which puts her at risk of overuse nerve injury at the wrist.    - NCS/EMG of the arms  2. Idiopathic peripheral neuropathy of the legs.  No history of diabetes or alcohol use.  Thyroid is well-controlled.  Vitamin B12 is normal.  I had extensive discussion with the patient regarding the pathogenesis, etiology, management, and natural course of neuropathy. Neuropathy tends to be slowly progressive, especially if a treatable etiology is not identified.  Her work-up thus far has been negative. I discussed that in the vast majority of cases, despite checking for reversible causes, we are unable to find the underlying etiology and management is symptomatic.    - Check folate, copper, vitamin B1, SPEP with IFE  - Fall precautions discussed, even more important as she is on anticoagulation therapy  - Recommend she use a rollator for long distances  - Start using a shower chair  - Check feet daily  - PT for balance training offered, she would like to think about it  3.  Hyperreflexia  due to lumbar spinal canal stenosis. She denies radicular leg pain, neurogenic symptoms, or weakness.  - Monitor clinically  Return to clinic after EMG   Greater than 50% of this 60 minute visit was spent in counseling, explanation of diagnosis, planning of further management, and coordination of care.   Thank you for allowing me to participate in patient's care.  If I can answer any additional questions, I would be pleased to do so.    Sincerely,    Balinda Heacock K. Posey Pronto, DO

## 2019-02-22 NOTE — Patient Instructions (Addendum)
Check labs  Nerve testing of the hands.  Do not apply lotion on your hands on the day of testing  Inspect your feet daily  Start using a shower chair to minimize risk of falls in the shower  Use a Rollator for long distances or when you are walking on any uneven ground.  Take extra caution  ELECTROMYOGRAM AND NERVE CONDUCTION STUDIES (EMG/NCS) INSTRUCTIONS  How to Prepare The neurologist conducting the EMG will need to know if you have certain medical conditions. Tell the neurologist and other EMG lab personnel if you: . Have a pacemaker or any other electrical medical device . Take blood-thinning medications . Have hemophilia, a blood-clotting disorder that causes prolonged bleeding Bathing Take a shower or bath shortly before your exam in order to remove oils from your skin. Don't apply lotions or creams before the exam.  What to Expect You'll likely be asked to change into a hospital gown for the procedure and lie down on an examination table. The following explanations can help you understand what will happen during the exam.  . Electrodes. The neurologist or a technician places surface electrodes at various locations on your skin depending on where you're experiencing symptoms. Or the neurologist may insert needle electrodes at different sites depending on your symptoms.  . Sensations. The electrodes will at times transmit a tiny electrical current that you may feel as a twinge or spasm. The needle electrode may cause discomfort or pain that usually ends shortly after the needle is removed. If you are concerned about discomfort or pain, you may want to talk to the neurologist about taking a short break during the exam.  . Instructions. During the needle EMG, the neurologist will assess whether there is any spontaneous electrical activity when the muscle is at rest - activity that isn't present in healthy muscle tissue - and the degree of activity when you slightly contract the muscle.   He or she will give you instructions on resting and contracting a muscle at appropriate times. Depending on what muscles and nerves the neurologist is examining, he or she may ask you to change positions during the exam.  After your EMG You may experience some temporary, minor bruising where the needle electrode was inserted into your muscle. This bruising should fade within several days. If it persists, contact your primary care doctor.   Your provider has requested that you have labwork completed today. Please go to [facility] ([id number]) on the second floor of this building before leaving the office today. You do not need to check in. If you are not called within 15 minutes please check with the front desk.

## 2019-02-26 ENCOUNTER — Telehealth: Payer: Self-pay

## 2019-02-26 LAB — IMMUNOFIXATION ELECTROPHORESIS
IgG (Immunoglobin G), Serum: 1279 mg/dL (ref 600–1540)
IgM, Serum: 73 mg/dL (ref 50–300)
Immunofix Electr Int: NOT DETECTED
Immunoglobulin A: 256 mg/dL (ref 70–320)

## 2019-02-26 LAB — FOLATE: Folate: 18.3 ng/mL

## 2019-02-26 LAB — EXTRA SPECIMEN

## 2019-02-26 LAB — PROTEIN ELECTROPHORESIS, SERUM
Albumin ELP: 3.8 g/dL (ref 3.8–4.8)
Alpha 1: 0.3 g/dL (ref 0.2–0.3)
Alpha 2: 0.8 g/dL (ref 0.5–0.9)
Beta 2: 0.4 g/dL (ref 0.2–0.5)
Beta Globulin: 0.5 g/dL (ref 0.4–0.6)
Gamma Globulin: 1.2 g/dL (ref 0.8–1.7)
Total Protein: 6.9 g/dL (ref 6.1–8.1)

## 2019-02-26 LAB — VITAMIN B1: Vitamin B1 (Thiamine): 16 nmol/L (ref 8–30)

## 2019-02-26 LAB — COPPER, SERUM: Copper: 129 ug/dL (ref 70–175)

## 2019-02-26 NOTE — Telephone Encounter (Signed)
Patient called.

## 2019-03-04 ENCOUNTER — Encounter: Payer: Self-pay | Admitting: Podiatry

## 2019-03-04 ENCOUNTER — Other Ambulatory Visit: Payer: Self-pay

## 2019-03-04 ENCOUNTER — Ambulatory Visit (INDEPENDENT_AMBULATORY_CARE_PROVIDER_SITE_OTHER): Payer: Medicare Other | Admitting: Podiatry

## 2019-03-04 DIAGNOSIS — D689 Coagulation defect, unspecified: Secondary | ICD-10-CM

## 2019-03-04 DIAGNOSIS — B351 Tinea unguium: Secondary | ICD-10-CM | POA: Diagnosis not present

## 2019-03-04 DIAGNOSIS — L608 Other nail disorders: Secondary | ICD-10-CM

## 2019-03-04 DIAGNOSIS — M79676 Pain in unspecified toe(s): Secondary | ICD-10-CM

## 2019-03-04 NOTE — Progress Notes (Signed)
Complaint:  Visit Type: Patient returns to my office for continued preventative foot care services. Complaint: Patient states" my nails have grown long and thick and become painful to walk and wear shoes" . The patient presents for preventative foot care services. No changes to ROS.  Patient is taking coumadin.  Podiatric Exam: Vascular: dorsalis pedis and posterior tibial pulses are palpable bilateral. Capillary return is immediate. Temperature gradient is WNL. Skin turgor WNL  Sensorium: Normal Semmes Weinstein monofilament test. Normal tactile sensation bilaterally. Nail Exam: Pt has thick disfigured discolored nails with subungual debris noted bilateral entire nail hallux through fifth toenails.  Pincer toenail right hallux. Ulcer Exam: There is no evidence of ulcer or pre-ulcerative changes or infection. Orthopedic Exam: Muscle tone and strength are WNL. No limitations in general ROM. No crepitus or effusions noted. Foot type and digits show no abnormalities. HAV  B/L.  Hammer toe B/L. Skin:  . No infection or ulcers.     Diagnosis:  Onychomycosis, , Pain in right toe, pain in left toes   Treatment & Plan Procedures and Treatment: Consent by patient was obtained for treatment procedures.   Debridement of mycotic and hypertrophic toenails, 1 through 5 bilateral and clearing of subungual debris. No ulceration, no infection noted. Debride ingrown nail right hallux.  Neosporin/DSD. Return Visit-Office Procedure: Patient instructed to return to the office for a follow up visit 10 weeks  for continued evaluation and treatment.    Arkie Tagliaferro DPM 

## 2019-03-23 ENCOUNTER — Other Ambulatory Visit: Payer: Self-pay | Admitting: Internal Medicine

## 2019-03-23 DIAGNOSIS — Z1231 Encounter for screening mammogram for malignant neoplasm of breast: Secondary | ICD-10-CM

## 2019-04-01 ENCOUNTER — Encounter: Payer: Medicare Other | Admitting: Neurology

## 2019-04-19 ENCOUNTER — Other Ambulatory Visit: Payer: Self-pay | Admitting: Internal Medicine

## 2019-05-06 ENCOUNTER — Ambulatory Visit
Admission: RE | Admit: 2019-05-06 | Discharge: 2019-05-06 | Disposition: A | Discharge status: Home / Self Care | Payer: Medicare Other | Source: Ambulatory Visit | Attending: Internal Medicine | Admitting: Internal Medicine

## 2019-05-06 DIAGNOSIS — Z1231 Encounter for screening mammogram for malignant neoplasm of breast: Secondary | ICD-10-CM

## 2019-05-10 ENCOUNTER — Other Ambulatory Visit: Payer: Self-pay

## 2019-05-11 ENCOUNTER — Ambulatory Visit (INDEPENDENT_AMBULATORY_CARE_PROVIDER_SITE_OTHER): Payer: Medicare Other | Admitting: Neurology

## 2019-05-11 ENCOUNTER — Other Ambulatory Visit: Payer: Self-pay

## 2019-05-11 DIAGNOSIS — M48061 Spinal stenosis, lumbar region without neurogenic claudication: Secondary | ICD-10-CM

## 2019-05-11 DIAGNOSIS — M5416 Radiculopathy, lumbar region: Secondary | ICD-10-CM

## 2019-05-11 DIAGNOSIS — G629 Polyneuropathy, unspecified: Secondary | ICD-10-CM

## 2019-05-11 DIAGNOSIS — G5603 Carpal tunnel syndrome, bilateral upper limbs: Secondary | ICD-10-CM

## 2019-05-11 NOTE — Procedures (Signed)
[facility] Neurology  [place], [address]  [place], [place] [address] Tel: [phone] Fax:  [phone] Test Date:  05/11/2019  Patient: Ana Cruz DOB: 1933/09/05 Physician: Narda Amber, DO  Sex: Female Height: 5\' 3"  Ref Phys: Narda Amber, DO  ID#: [id number] Temp: 35.0C Technician:    Patient Complaints: This is a 85 year old female with peripheral neuropathy referred for evaluation of bilateral hand numbness.  NCV & EMG Findings: Extensive electrodiagnostic testing of the right upper extremity and additional studies of the left shows:  1. Right median sensory response shows prolonged latency (4.0 ms).  Left mixed palmar sensory response shows prolonged latency.  Left median and bilateral ulnar sensory responses are within normal limits.   2. Bilateral median and ulnar motor responses are within normal limits.   3. There is no evidence of active or chronic motor axonal loss changes affecting any of the tested muscles.  Motor unit configuration and recruitment pattern is within normal limits.    Impression: Bilateral median neuropathy at or distal to the wrist, consistent with a clinical diagnosis of carpal tunnel syndrome.  Overall, these findings are mild in degree electrically and worse on the right.   ___________________________ Narda Amber, DO    Nerve Conduction Studies Anti Sensory Summary Table   Site NR Peak (ms) Norm Peak (ms) P-T Amp (V) Norm P-T Amp  Left Median Anti Sensory (2nd Digit)  35C  Wrist    3.3 <3.8 14.2 >10  Right Median Anti Sensory (2nd Digit)  35C  Wrist    4.0 <3.8 13.5 >10  Left Ulnar Anti Sensory (5th Digit)  35C  Wrist    2.8 <3.2 9.8 >5  Right Ulnar Anti Sensory (5th Digit)  35C  Wrist    2.8 <3.2 10.4 >5   Motor Summary Table   Site NR Onset (ms) Norm Onset (ms) O-P Amp (mV) Norm O-P Amp Site1 Site2 Delta-0 (ms) Dist (cm) Vel (m/s) Norm Vel (m/s)  Left Median Motor (Abd Poll Brev)  35C  Wrist    3.4 <4.0 6.3  >5 Elbow Wrist 5.5 28.0 51 >50  Elbow    8.9  5.2         Right Median Motor (Abd Poll Brev)  35C  Wrist    3.9 <4.0 7.6 >5 Elbow Wrist 5.4 29.0 54 >50  Elbow    9.3  6.7         Left Ulnar Motor (Abd Dig Minimi)  35C  Wrist    2.6 <3.1 8.6 >7 B Elbow Wrist 3.6 23.0 64 >50  B Elbow    6.2  8.6  A Elbow B Elbow 1.7 10.0 59 >50  A Elbow    7.9  8.0         Right Ulnar Motor (Abd Dig Minimi)  35C  Wrist    2.5 <3.1 9.3 >7 B Elbow Wrist 3.8 23.0 61 >50  B Elbow    6.3  9.3  A Elbow B Elbow 1.7 10.0 59 >50  A Elbow    8.0  8.9          Comparison Summary Table   Site NR Peak (ms) Norm Peak (ms) P-T Amp (V) Site1 Site2 Delta-P (ms) Norm Delta (ms)  Left Median/Ulnar Palm Comparison (Wrist - 8cm)  35C  Median Palm    2.3 <2.2 17.3 Median Palm Ulnar Palm 0.9   Ulnar Palm    1.4 <2.2 10.9       EMG  Side Muscle Ins Act Fibs Psw Fasc Number Recrt Dur Dur. Amp Amp. Poly Poly. Comment  Right 1stDorInt Nml Nml Nml Nml Nml Nml Nml Nml Nml Nml Nml Nml N/A  Right Abd Poll Brev Nml Nml Nml Nml Nml Nml Nml Nml Nml Nml Nml Nml N/A  Right PronatorTeres Nml Nml Nml Nml Nml Nml Nml Nml Nml Nml Nml Nml N/A  Right Biceps Nml Nml Nml Nml Nml Nml Nml Nml Nml Nml Nml Nml N/A  Right Triceps Nml Nml Nml Nml Nml Nml Nml Nml Nml Nml Nml Nml N/A  Right Deltoid Nml Nml Nml Nml Nml Nml Nml Nml Nml Nml Nml Nml N/A  Left 1stDorInt Nml Nml Nml Nml Nml Nml Nml Nml Nml Nml Nml Nml N/A  Left Abd Poll Brev Nml Nml Nml Nml Nml Nml Nml Nml Nml Nml Nml Nml N/A  Left PronatorTeres Nml Nml Nml Nml Nml Nml Nml Nml Nml Nml Nml Nml N/A  Left Biceps Nml Nml Nml Nml Nml Nml Nml Nml Nml Nml Nml Nml N/A  Left Triceps Nml Nml Nml Nml Nml Nml Nml Nml Nml Nml Nml Nml N/A  Left Deltoid Nml Nml Nml Nml Nml Nml Nml Nml Nml Nml Nml Nml N/A      Waveforms:

## 2019-05-13 ENCOUNTER — Ambulatory Visit: Payer: Medicare Other | Admitting: Podiatry

## 2019-05-24 IMAGING — CR DG CHEST 2V
1 series · 2 of 2 positions shown · non-contrast
Comparison: 05/26/2012

CLINICAL DATA: Chest pain and dizziness

EXAM:
CHEST - 2 VIEW

[Series 1: dg chest 2 view · 0.14mm/px · 2 of 2 slices shown]
[im 1/2]
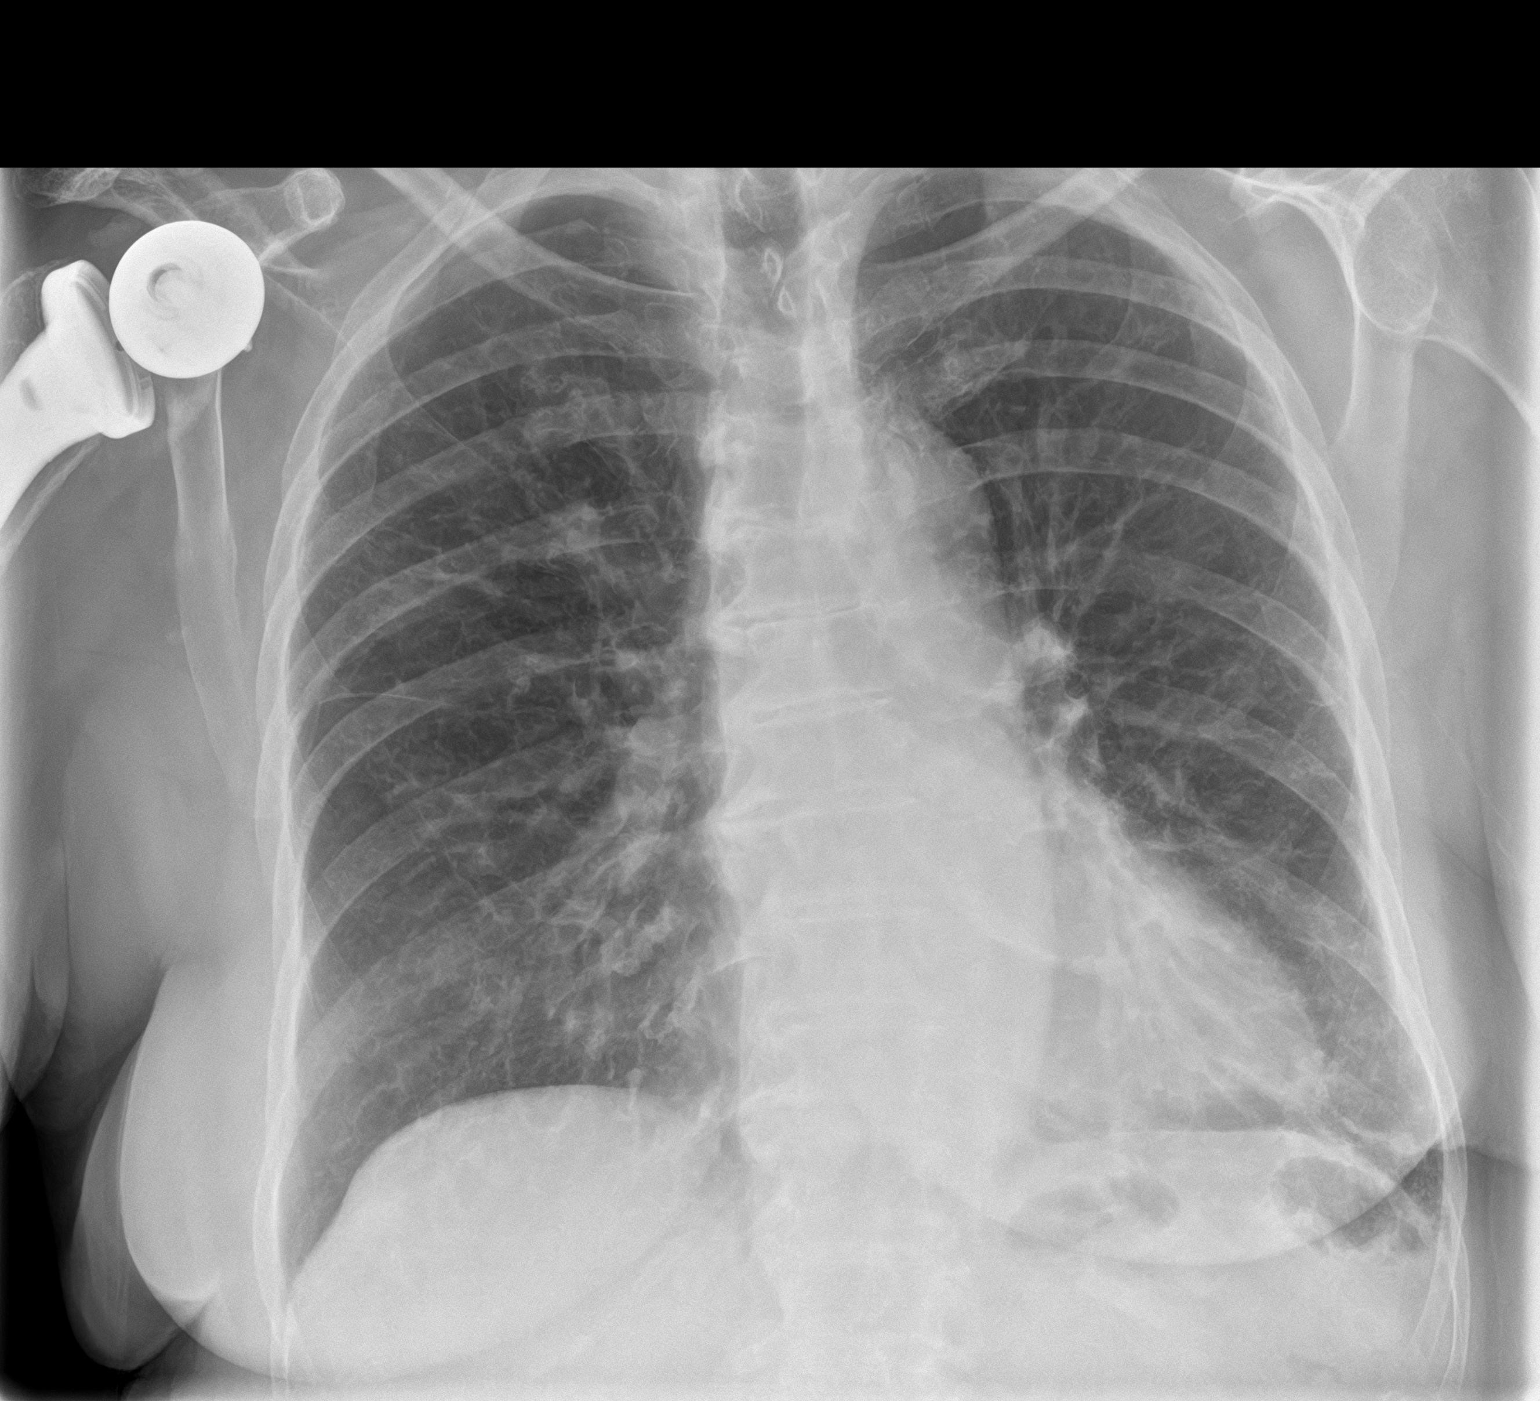
[im 2/2]
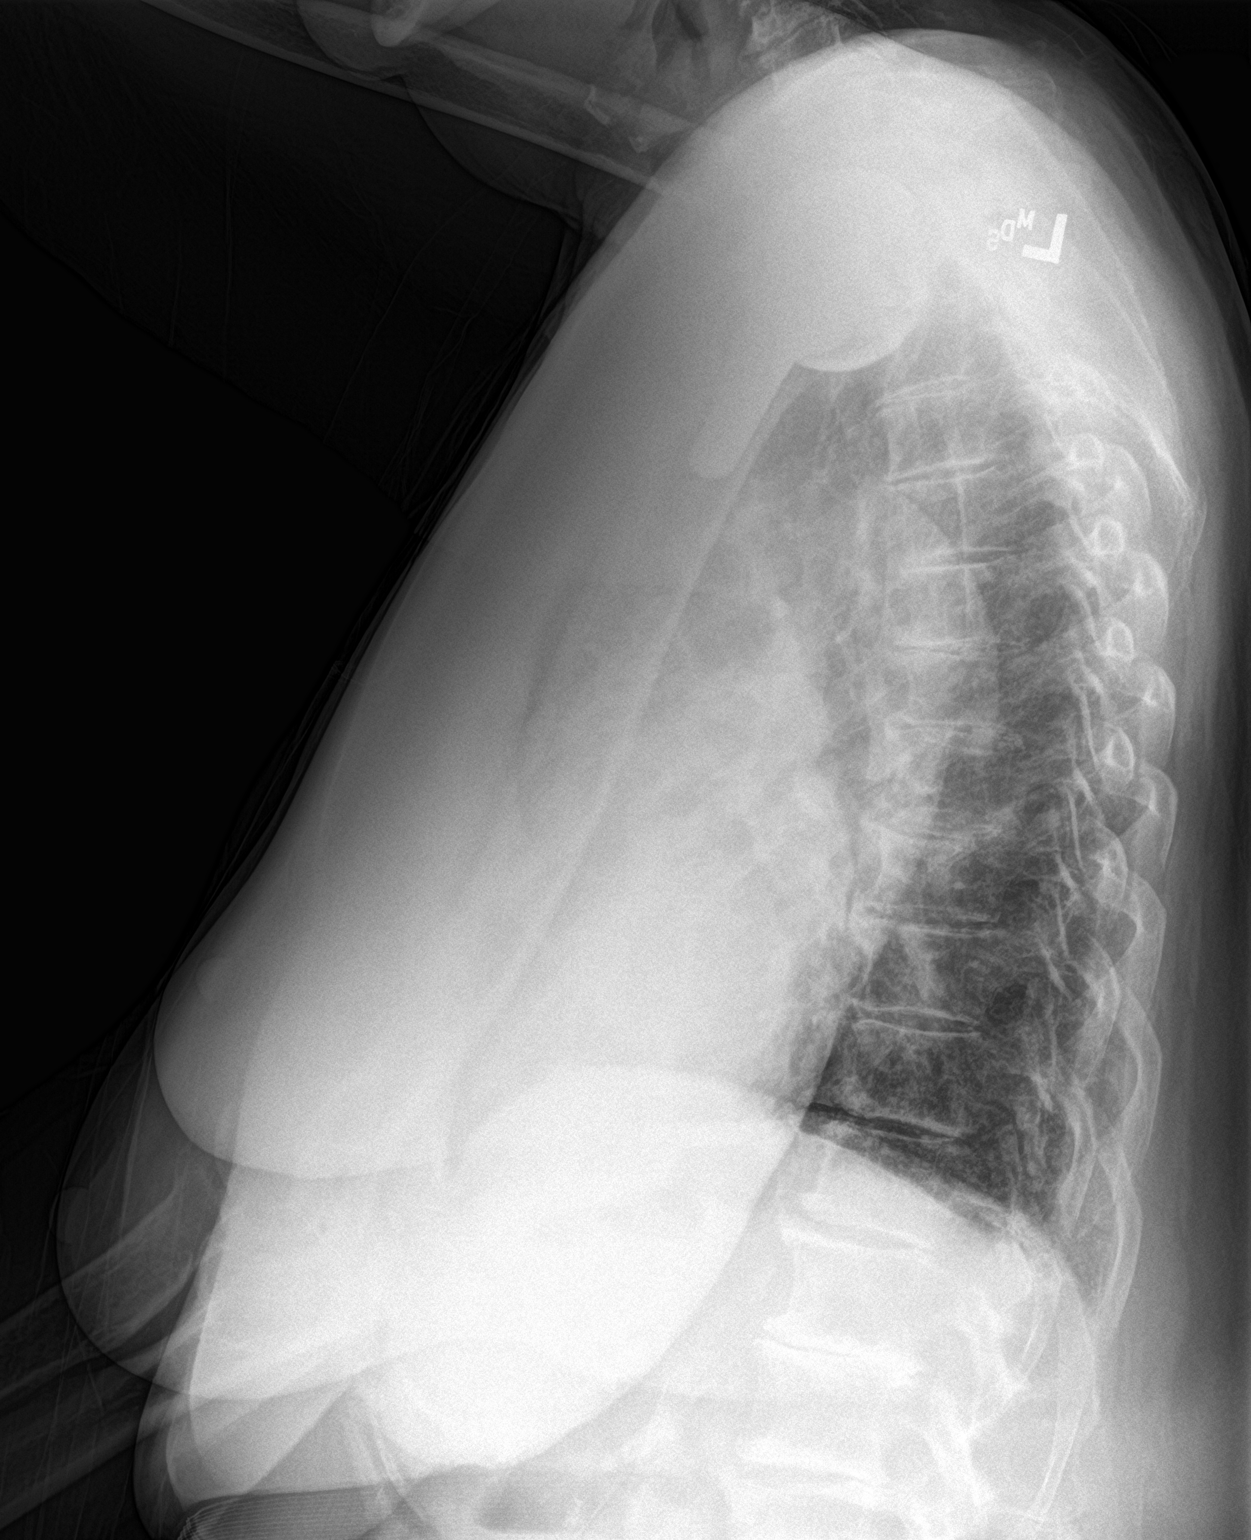

[2 of 2 positions shown; findings below may reference images not displayed]

FINDINGS: Cardiac shadow is stable. The lungs are well aerated bilaterally.
Mild interstitial changes are seen without focal confluent
infiltrate. Postsurgical changes in the right shoulder are seen. No
acute bony abnormality is noted.
IMPRESSION: Increased interstitial markings bilaterally which may be related to
some mild bronchitis. No focal confluent infiltrate is seen.

## 2019-06-16 ENCOUNTER — Telehealth: Payer: Self-pay | Admitting: Internal Medicine

## 2019-06-16 NOTE — Telephone Encounter (Signed)
Pt has a virtual scheduled on 06/23/2019, she would like to have labs done before appt. No orders are in, does she need labs and if so let me know.

## 2019-06-16 NOTE — Telephone Encounter (Signed)
Spoke with pt and she has decided to move her appt out till March in hopes that we are seeing patients again in the office. She also decided that she would wait until then to have her blood work done.

## 2019-06-16 NOTE — Telephone Encounter (Signed)
Labs typically done every 6 nonths so not due until February . If she would like to do them a little early please order:  cmet Cbc w diff a1c TsH Lipid

## 2019-06-16 NOTE — Telephone Encounter (Signed)
Pt had labs done in August. Is she do for labs agin before her appt on 06/23/2019?

## 2019-06-23 ENCOUNTER — Ambulatory Visit: Payer: Medicare Other | Admitting: Internal Medicine

## 2019-07-26 LAB — BASIC METABOLIC PANEL
BUN: 34 — AB (ref 4–21)
Creatinine: 1.2 — AB (ref 0.5–1.1)

## 2019-07-26 LAB — CBC AND DIFFERENTIAL: Hemoglobin: 10.7 — AB (ref 12.0–16.0)

## 2019-07-29 ENCOUNTER — Other Ambulatory Visit: Payer: Self-pay | Admitting: Internal Medicine

## 2019-07-29 DIAGNOSIS — E039 Hypothyroidism, unspecified: Secondary | ICD-10-CM

## 2019-08-11 ENCOUNTER — Encounter: Payer: Self-pay | Admitting: Internal Medicine

## 2019-08-11 ENCOUNTER — Other Ambulatory Visit: Payer: Self-pay

## 2019-08-11 ENCOUNTER — Ambulatory Visit (INDEPENDENT_AMBULATORY_CARE_PROVIDER_SITE_OTHER): Payer: Medicare Other | Admitting: Internal Medicine

## 2019-08-11 VITALS — BP 156/84 | HR 67 | Temp 97.0°F | Resp 16 | Ht 63.0 in | Wt 172.6 lb

## 2019-08-11 DIAGNOSIS — N189 Chronic kidney disease, unspecified: Secondary | ICD-10-CM | POA: Diagnosis not present

## 2019-08-11 DIAGNOSIS — I480 Paroxysmal atrial fibrillation: Secondary | ICD-10-CM

## 2019-08-11 DIAGNOSIS — I10 Essential (primary) hypertension: Secondary | ICD-10-CM

## 2019-08-11 DIAGNOSIS — D631 Anemia in chronic kidney disease: Secondary | ICD-10-CM

## 2019-08-11 DIAGNOSIS — E782 Mixed hyperlipidemia: Secondary | ICD-10-CM

## 2019-08-11 DIAGNOSIS — E034 Atrophy of thyroid (acquired): Secondary | ICD-10-CM

## 2019-08-11 DIAGNOSIS — N1831 Chronic kidney disease, stage 3a: Secondary | ICD-10-CM

## 2019-08-11 DIAGNOSIS — R7303 Prediabetes: Secondary | ICD-10-CM

## 2019-08-11 DIAGNOSIS — E559 Vitamin D deficiency, unspecified: Secondary | ICD-10-CM

## 2019-08-11 NOTE — Assessment & Plan Note (Signed)
she reports compliance with medication regimen  but has an elevated reading today in office.  She is not using NSAIDs daily.  Discussed goal of 120/70  (130/80 for patients over 70)  to preserve renal function.  She has been asked to check her  BP  at home and  submit readings for evaluation. Renal function, electrolytes and screen for proteinuria are all normal . 

## 2019-08-11 NOTE — Patient Instructions (Addendum)
You are doing well!    Please check your blood pressure a few times at home Ovid  and send me the readings so I can determine if you need a change in medication   We will repeat your labs in august at your leisure

## 2019-08-11 NOTE — Progress Notes (Signed)
Subjective:  Patient ID: Ana Cruz, female    DOB: 1933/09/16  Age: 86 y.o. MRN: [id number]  CC: The primary encounter diagnosis was Anemia of chronic renal failure, unspecified CKD stage. Diagnoses of Mixed hyperlipidemia, Stage 3a chronic kidney disease, Hypothyroidism due to acquired atrophy of thyroid, Vitamin D deficiency, Essential hypertension, Paroxysmal atrial fibrillation ([place]), and Prediabetes were also pertinent to this visit.  HPI Ana Cruz presents for follow up on hypertension,  Hypothyroidism  Obesity,  Lumbar spinal stenosis  And PAF  This visit occurred during the SARS-CoV-2 public health emergency.  Safety protocols were in place, including screening questions prior to the visit, additional usage of staff PPE, and extensive cleaning of exam room while observing appropriate contact time as indicated for disinfecting solutions.    Hypertension: patient checks blood pressure twice weekly at home.  Readings have been for the most part < 140/80 at rest . Patient is following a reduce salt diet most days and is taking medications as prescribed  Patient has received both doses of the available COVID 19 vaccine without complications.  Patient continues to mask when outside of the home except when walking in yard or at safe distances from others .  Patient denies any change in mood or development of unhealthy behaviors resuting from the pandemic's restriction of activities and socialization.    Prediabetes:  Following a low GI diet and walking 3 days per week.    PAF:  No recent episodes,  Saw Kowalksi on  Nov 30.  EKG SR with PVC's .   no changes to regimen  CKD:  Saw Singh for follow up GFR stable by Feb 2021 labs and PTH normal.    Avoiding nsaids   Outpatient Medications Prior to Visit  Medication Sig Dispense Refill  . acetaminophen (TYLENOL) 500 MG tablet Take 500 mg by mouth every 6 (six) hours as needed (As needed for pain).    [name] amoxicillin (AMOXIL)  500 MG capsule Take 500 mg by mouth. Take four tablets prior to dental work.    . Artificial Tear Ointment (EYE LUBRICANT OP) Apply 1 drop to eye 2 (two) times daily.    . Cholecalciferol (VITAMIN D3) 1000 UNITS CAPS Take 1,000 Units by mouth daily.     [name] diltiazem (CARDIZEM) 30 MG tablet Take 30 mg by mouth 2 (two) times daily as needed.    [name] levothyroxine (SYNTHROID) 88 MCG tablet TAKE 1 TABLET (88 MCG TOTAL) BY MOUTH DAILY BEFORE BREAKFAST. 90 tablet 1  . pravastatin (PRAVACHOL) 20 MG tablet TAKE 1 TABLET BY MOUTH EVERY DAY 90 tablet 3  . Probiotic Product (ACIDOPHILUS/GOAT MILK) CAPS Take by mouth.    . telmisartan (MICARDIS) 40 MG tablet Take 80 mg by mouth daily.     . vitamin B-12 (CYANOCOBALAMIN) 1000 MCG tablet Take 1 tablet (1,000 mcg total) by mouth daily. 90 tablet 3  . warfarin (COUMADIN) 1 MG tablet Take 1 mg by mouth daily.      No facility-administered medications prior to visit.    Review of Systems;  Patient denies headache, fevers, malaise, unintentional weight loss, skin rash, eye pain, sinus congestion and sinus pain, sore throat, dysphagia,  hemoptysis , cough, dyspnea, wheezing, chest pain, palpitations, orthopnea, edema, abdominal pain, nausea, melena, diarrhea, constipation, flank pain, dysuria, hematuria, urinary  Frequency, nocturia, numbness, tingling, seizures,  Focal weakness, Loss of consciousness,  Tremor, insomnia, depression, anxiety, and suicidal ideation.      Objective:  BP (!) 156/84 (BP  Location: Left Arm, Patient Position: Sitting, Cuff Size: Normal)   Pulse 67   Temp (!) 97 F (36.1 C) (Temporal)   Resp 16   Ht 5\' 3"  (1.6 m)   Wt 172 lb 9.6 oz (78.3 kg)   SpO2 98%   BMI 30.57 kg/m   BP Readings from Last 3 Encounters:  08/11/19 (!) 156/84  02/22/19 136/70  12/21/18 138/63    Wt Readings from Last 3 Encounters:  08/11/19 172 lb 9.6 oz (78.3 kg)  02/22/19 172 lb (78 kg)  12/21/18 170 lb (77.1 kg)    General appearance: alert,  cooperative and appears stated age Ears: normal TM's and external ear canals both ears Throat: lips, mucosa, and tongue normal; teeth and gums normal Neck: no adenopathy, no carotid bruit, supple, symmetrical, trachea midline and thyroid not enlarged, symmetric, no tenderness/mass/nodules Back: symmetric, no curvature. ROM normal. No CVA tenderness. Lungs: clear to auscultation bilaterally Heart: regular rate and rhythm, S1, S2 normal, no murmur, click, rub or gallop Abdomen: soft, non-tender; bowel sounds normal; no masses,  no organomegaly Pulses: 2+ and symmetric Skin: Skin color, texture, turgor normal. No rashes or lesions Lymph nodes: Cervical, supraclavicular, and axillary nodes normal.  MM 3D SCREEN BREAST BILATERAL  Result Date: 05/06/2019 CLINICAL DATA:  Screening. EXAM: DIGITAL SCREENING BILATERAL MAMMOGRAM WITH TOMO AND CAD COMPARISON:  Previous exam(s). ACR Breast Density Category b: There are scattered areas of fibroglandular density. FINDINGS: There are no findings suspicious for malignancy. Images were processed with CAD. IMPRESSION: No mammographic evidence of malignancy. A result letter of this screening mammogram will be mailed directly to the patient. RECOMMENDATION: Screening mammogram in one year. (Code:SM-B-01Y) BI-RADS CATEGORY  1: Negative. Electronically Signed   By: Dorise Bullion III M.D   On: 05/06/2019 17:54    Assessment & Plan:   Problem List Items Addressed This Visit      Unprioritized   CKD (chronic kidney disease) stage 3, GFR 30-59 ml/min ([place])    Renal function has returned to baseline with avoidance of NSAIDs.  She is on an ARB for control of hypertension,, and statin for control of hyperlipidemia. GFR is stable per review of Feb 2021 labs doney by Dr Candiss Norse available via Epic portal    Lab Results  Component Value Date   CREATININE 1.08 01/05/2019         Relevant Orders   Comprehensive metabolic panel   Hypertension    she reports compliance  with medication regimen  but has an elevated reading today in office.  She is not using NSAIDs daily.  Discussed goal of 120/70  (130/80 for patients over 70)  to preserve renal function.  She has been asked to check her  BP  at home and  submit readings for evaluation. Renal function, electrolytes and screen for proteinuria are all normal       Relevant Medications   diltiazem (CARDIZEM) 30 MG tablet   Paroxysmal atrial fibrillation ([facility])    She has not required rate controlling medications in years.  She is anticoagulated with warfarin , current dose is 1 mg daily,  INR managed y Kowalksi/Cardiology ,  Has a prn dose of diltiazem       Relevant Medications   diltiazem (CARDIZEM) 30 MG tablet   Hyperlipidemia    Managed with pravastatin .  fasting lipids due   Lab Results  Component Value Date   CHOL 216 (H) 01/05/2019   HDL 70.50 01/05/2019   LDLCALC 124 (H) 01/05/2019  LDLDIRECT 107.0 03/14/2017   TRIG 106.0 01/05/2019   CHOLHDL 3 01/05/2019        Relevant Medications   diltiazem (CARDIZEM) 30 MG tablet   Other Relevant Orders   Lipid panel   Hypothyroidism   Relevant Orders   TSH   Prediabetes    Her  random glucose was mildly elevated in August and her A1c suggested she is at risk for developing diabetes.  I recommends he follow a low glycemic index diet and particpate regularly in an aerobic  exercise activity.  We should check an A1c every  6 months.   Lab Results  Component Value Date   HGBA1C 5.8 01/05/2019          Other Visit Diagnoses    Anemia of chronic renal failure, unspecified CKD stage    -  Primary   Relevant Orders   CBC with Differential/Platelet   Vitamin D deficiency       Relevant Orders   VITAMIN D 25 Hydroxy (Vit-D Deficiency, Fractures)     I provided  30 minutes of face-to-face time during this encounter reviewing patient's current problems and past surgeries, labs and imaging studies, providing counseling on the above mentioned  problems , and coordination  of care .  I am having Ana Cruz maintain her Vitamin D3, amoxicillin, warfarin, acetaminophen, Artificial Tear Ointment (EYE LUBRICANT OP), telmisartan, vitamin B-12, pravastatin, levothyroxine, diltiazem, and Acidophilus/Goat Milk.  No orders of the defined types were placed in this encounter.   There are no discontinued medications.  Follow-up: No follow-ups on file.   Crecencio Mc, MD

## 2019-08-12 ENCOUNTER — Encounter: Payer: Self-pay | Admitting: Neurology

## 2019-08-13 ENCOUNTER — Telehealth (INDEPENDENT_AMBULATORY_CARE_PROVIDER_SITE_OTHER): Payer: Medicare Other | Admitting: Neurology

## 2019-08-13 ENCOUNTER — Other Ambulatory Visit: Payer: Self-pay

## 2019-08-13 VITALS — Ht 63.0 in

## 2019-08-13 DIAGNOSIS — G5603 Carpal tunnel syndrome, bilateral upper limbs: Secondary | ICD-10-CM | POA: Diagnosis not present

## 2019-08-13 DIAGNOSIS — G629 Polyneuropathy, unspecified: Secondary | ICD-10-CM | POA: Diagnosis not present

## 2019-08-13 DIAGNOSIS — R7303 Prediabetes: Secondary | ICD-10-CM | POA: Insufficient documentation

## 2019-08-13 NOTE — Assessment & Plan Note (Addendum)
Managed with pravastatin .  fasting lipids due   Lab Results  Component Value Date   CHOL 216 (H) 01/05/2019   HDL 70.50 01/05/2019   LDLCALC 124 (H) 01/05/2019   LDLDIRECT 107.0 03/14/2017   TRIG 106.0 01/05/2019   CHOLHDL 3 01/05/2019

## 2019-08-13 NOTE — Progress Notes (Signed)
   Due to the COVID-19 crisis, this virtual check-in visit was done via telephone from my office and it was initiated and consent given by this patient and or family.   Telephone (Audio) Visit The purpose of this telephone visit is to provide medical care while limiting exposure to the novel coronavirus.    Consent was obtained for telephone visit and initiated by pt/family:  Yes.   Answered questions that patient had about telehealth interaction:  Yes.   I discussed the limitations, risks, security and privacy concerns of performing an evaluation and management service by telephone. I also discussed with the patient that there may be a patient responsible charge related to this service. The patient expressed understanding and agreed to proceed.  Pt location: Home Physician Location: office Name of referring provider:  Crecencio Mc, MD I connected with .Ana Cruz at patients initiation/request on 08/13/2019 at  1:30 PM EST by telephone and verified that I am speaking with the correct person using two identifiers.  Pt MRN:  [id number] Pt DOB:  [birth year]   History of Present Illness:  This is a 86 year-old female here for follow-up of bilateral carpal tunnel syndrome and idiopathic neuropathy.  She had NCS/EMG which confirmed the presence of mild bilateral carpal tunnel syndrome.  She is very compliant using wrist braces which has significantly improved her symptoms and no longer wakes up at night with her hands falling asleep.   Her neuropathy in the legs is unchanged and remains numb in the feet.  She is very careful when walking and always uses a cane.  She has not had any falls No new complaints of low back pain, leg weakness, or shooting pain in the legs.   Assessment and Plan:   1.  Bilateral carpal tunnel syndrome (mild), markedly improved with conservative therapies  - Continue to use wrist splints nightly  2.  Idiopathic peripheral neuropathy manifesting with numbness  and sensory ataxia, stable.  - She is very careful when walking and uses a cane  - If imbalance progresses, PT for gait training would be the next step.  Follow Up Instructions:   I discussed the assessment and treatment plan with the patient. The patient was provided an opportunity to ask questions and all were answered. The patient agreed with the plan and demonstrated an understanding of the instructions.   The patient was advised to call back or seek an in-person evaluation if the symptoms worsen or if the condition fails to improve as anticipated.   Total Time spent in visit with the patient was:  5 min, of which 100% of the time was spent in counseling and/or coordinating care.   Pt understands and agrees with the plan of care outlined.     Alda Berthold, DO

## 2019-08-13 NOTE — Assessment & Plan Note (Signed)
Her  random glucose was mildly elevated in August and her A1c suggested she is at risk for developing diabetes.  I recommends he follow a low glycemic index diet and particpate regularly in an aerobic  exercise activity.  We should check an A1c every  6 months.   Lab Results  Component Value Date   HGBA1C 5.8 01/05/2019

## 2019-08-13 NOTE — Assessment & Plan Note (Signed)
Renal function has returned to baseline with avoidance of NSAIDs.  She is on an ARB for control of hypertension,, and statin for control of hyperlipidemia. GFR is stable per review of Feb 2021 labs doney by Dr Candiss Norse available via Epic portal    Lab Results  Component Value Date   CREATININE 1.08 01/05/2019

## 2019-08-13 NOTE — Assessment & Plan Note (Signed)
She has not required rate controlling medications in years.  She is anticoagulated with warfarin , current dose is 1 mg daily,  INR managed y Kowalksi/Cardiology ,  Has a prn dose of diltiazem

## 2019-10-19 ENCOUNTER — Encounter: Payer: Self-pay | Admitting: Internal Medicine

## 2019-10-19 ENCOUNTER — Ambulatory Visit (INDEPENDENT_AMBULATORY_CARE_PROVIDER_SITE_OTHER): Payer: Medicare Other | Admitting: Internal Medicine

## 2019-10-19 ENCOUNTER — Other Ambulatory Visit: Payer: Self-pay

## 2019-10-19 ENCOUNTER — Ambulatory Visit (INDEPENDENT_AMBULATORY_CARE_PROVIDER_SITE_OTHER): Payer: Medicare Other

## 2019-10-19 VITALS — BP 140/72 | HR 64 | Temp 97.3°F | Ht 63.0 in | Wt 171.8 lb

## 2019-10-19 DIAGNOSIS — I10 Essential (primary) hypertension: Secondary | ICD-10-CM | POA: Diagnosis not present

## 2019-10-19 DIAGNOSIS — M79672 Pain in left foot: Secondary | ICD-10-CM | POA: Diagnosis not present

## 2019-10-19 DIAGNOSIS — G629 Polyneuropathy, unspecified: Secondary | ICD-10-CM | POA: Diagnosis not present

## 2019-10-19 MED ORDER — NORTRIPTYLINE HCL 10 MG PO CAPS: 10.0000 mg | ORAL_CAPSULE | Freq: Every day | ORAL | 0 refills | Status: DC

## 2019-10-19 NOTE — Progress Notes (Signed)
Chief Complaint  Patient presents with  . Foot Pain    Pt has a history of neuropathy. Having pain in her left foot. States no known injuries or changes.    F/u with husband  1. C/w neuropathy left foot worse than right 8/10 pain worse over 2 years and worse since 09/2019 does not want to try gabapentin husband tried and made him sleepy tried lidocaine, sleeve on foot and tens w/o help  Review prior MRI lumbar with herniated discs. Foot feels debilitated and hurts to stand baseline walks with cane but hurts to walk feels nauseated at times  Neurology Dr. Posey Pronto   2. HTN on dilt 30 mg prn and micardis 40 mg qd did not log 5 BP for PCP but will by f/u Declines to make med changes for now Dr. Raliegh Ip  appt 11/02/19 will discuss with him   3. Son died Aug 27, 2019    Review of Systems  Respiratory: Negative for shortness of breath.   Cardiovascular: Positive for chest pain.  Musculoskeletal: Positive for joint pain.  Neurological: Positive for sensory change.   Past Medical History:  Diagnosis Date  . allergic rhinitis   . Atrial fibrillation ([place])   . Chronic kidney disease, stage I    mild, did improve with cessation of NSAIDs  . Elbow fracture, left    repaired Jan 2000  . Hyperlipidemia   . Hypertension   . hypothyroidism    medicated since age 80, no prior surgery  . 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 ABDOMINAL HYSTERECTOMY  1979  . APPENDECTOMY  1969  . BILATERAL OOPHORECTOMY     [place]  . BLADDER REPAIR  1980  . OVARIAN CYST REMOVAL  April 1969  . REPLACEMENT TOTAL KNEE BILATERAL  08-27-07  . reverse shoulder surgery  04/05/2016  . ROTATOR CUFF REPAIR  Jan 2000  . ROTATOR CUFF REPAIR  2000   Family History  Problem Relation Age of Onset  . Hypertension Mother   . Dementia Mother   . Heart disease Father 10       MI  . Cancer Paternal Grandfather 38       colon  . Breast cancer Neg Hx     Social History   Socioeconomic History  . Marital status: Married    Spouse name: Not on file  . Number of children: 4  . Years of education: 13.5  . Highest education level: Not on file  Occupational History  . Occupation: retired  Tobacco Use  . Smoking status: Never Smoker  . Smokeless tobacco: Never Used  Substance and Sexual Activity  . Alcohol use: Yes  . Drug use: No  . Sexual activity: Not on file  Other Topics Concern  . Not on file  Social History Narrative   Married    [place]    Right handed   One story   Social Determinants of Health   Financial Resource Strain:   . Difficulty of Paying Living Expenses:   Food Insecurity:   . Worried About Charity fundraiser in the Last Year:   . Arboriculturist in the Last Year:   Transportation Needs:   . Film/video editor (Medical):   [name] Lack of Transportation (Non-Medical):   Physical Activity:   . Days of Exercise per Week:   . Minutes of Exercise per Session:  Stress:   . Feeling of Stress :   Social Connections:   . Frequency of Communication with Friends and Family:   . Frequency of Social Gatherings with Friends and Family:   . Attends Religious Services:   . Active Member of Clubs or Organizations:   . Attends Archivist Meetings:   [name] Marital Status:   Intimate Partner Violence:   . Fear of Current or Ex-Partner:   . Emotionally Abused:   [name] Physically Abused:   . Sexually Abused:    Current Meds  Medication Sig  . acetaminophen (TYLENOL) 500 MG tablet Take 500 mg by mouth every 6 (six) hours as needed (As needed for pain).  . Artificial Tear Ointment (EYE LUBRICANT OP) Apply 1 drop to eye 2 (two) times daily.  . Cholecalciferol (VITAMIN D3) 1000 UNITS CAPS Take 1,000 Units by mouth daily.   [name] levothyroxine (SYNTHROID) 88 MCG tablet TAKE 1 TABLET (88 MCG TOTAL) BY MOUTH DAILY BEFORE BREAKFAST.  [name] pravastatin (PRAVACHOL) 20 MG tablet TAKE 1 TABLET BY MOUTH EVERY DAY  .  Probiotic Product (ACIDOPHILUS/GOAT MILK) CAPS Take by mouth.  . telmisartan (MICARDIS) 40 MG tablet Take 80 mg by mouth daily.   . vitamin B-12 (CYANOCOBALAMIN) 1000 MCG tablet Take 1 tablet (1,000 mcg total) by mouth daily.  [name] warfarin (COUMADIN) 1 MG tablet Take 1 mg by mouth daily.    Allergies  Allergen Reactions  . Amlodipine     dizziness  . Chocolate Flavor Hives  . Clindamycin/Lincomycin     Tongue swelled,  Throat felt funny  . Onion Swelling    Throat swelling  . Other Hives    Raw Onions, Spring Time causes eye itching and runny nose. *Pt. Also Had Large Blisters from Tourniquets applied to her legs during her knee replacement surgery*  . Tape   . Hctz [Hydrochlorothiazide] Rash    rash   Recent Results (from the past 2160 hour(s))  CBC and differential     Status: Abnormal   Collection Time: 07/26/19 12:00 AM  Result Value Ref Range   Hemoglobin 10.7 (A) 12.0 - 99991111  Basic metabolic panel     Status: Abnormal   Collection Time: 07/26/19 12:00 AM  Result Value Ref Range   BUN 34 (A) 4 - 21   Creatinine 1.2 (A) 0.5 - 1.1   Objective  Body mass index is 30.43 kg/m. Wt Readings from Last 3 Encounters:  10/19/19 171 lb 12.8 oz (77.9 kg)  08/11/19 172 lb 9.6 oz (78.3 kg)  02/22/19 172 lb (78 kg)   Temp Readings from Last 3 Encounters:  10/19/19 (!) 97.3 F (36.3 C) (Temporal)  08/11/19 (!) 97 F (36.1 C) (Temporal)  12/24/18 98.1 F (36.7 C)   BP Readings from Last 3 Encounters:  10/19/19 140/72  08/11/19 (!) 156/84  02/22/19 136/70   Pulse Readings from Last 3 Encounters:  10/19/19 64  08/11/19 67  02/22/19 72    Physical Exam Vitals and nursing note reviewed.  Constitutional:      Appearance: Normal appearance. She is well-developed and well-groomed.  HENT:     Head: Normocephalic and atraumatic.  Eyes:     Conjunctiva/sclera: Conjunctivae normal.     Pupils: Pupils are equal, round, and reactive to light.  Cardiovascular:     Rate and  Rhythm: Normal rate and regular rhythm.     Heart sounds: Normal heart sounds.  Pulmonary:     Effort: Pulmonary effort is normal.  Breath sounds: Normal breath sounds.  Musculoskeletal:       Feet:  Feet:     Comments: 1+ DP/PT pulses left foot  Skin:    General: Skin is warm and dry.  Neurological:     General: No focal deficit present.     Mental Status: She is alert and oriented to person, place, and time. Mental status is at baseline.     Comments: BL walks with cane  Psychiatric:        Attention and Perception: Attention and perception normal.        Mood and Affect: Mood and affect normal.        Speech: Speech normal.        Behavior: Behavior normal. Behavior is cooperative.        Thought Content: Thought content normal.        Cognition and Memory: Cognition and memory normal.        Judgment: Judgment normal.     Assessment  Plan  Left foot pain - Plan: DG Foot Complete Left, nortriptyline (PAMELOR) 10 MG capsule Neuropathy - Plan: nortriptyline (PAMELOR) 10 MG capsule Consider arterial study (doppler arterial) in future established with Dr. Delana Meyer if EMG/NCS inconclusive Will do EMG/NCS neurology 1st sent note to covering providers Dr. Posey Pronto she is off until 12/24/19 to try to schedule EMG/NCS Try nortriptyline 10 mg qhs and alpha lipoid acid 600 mg bid  Disc scrambler therapy   Essential hypertension  On dilt 30 mg prn  micardis 40 mg qd offered to increase to 80 pt wants to f/u with PCP and/Or Dr. Raliegh Ip cards appt 11/02/19 cards  CC PCP and Dr. Raliegh Ip  Given BP log today per pt BP was 160s/80s today  Provider: Dr. Olivia Mackie McLean-Scocuzza-[facility]

## 2019-10-19 NOTE — Patient Instructions (Addendum)
Alpha lipoic acid 600 mg 1-2 x per day (antiinflammatory supplement)  We did a left foot Xray today   Nerve conduction study/EMG with Dr. Posey Pronto   Cymbalta and Effexor can interfere with Coumadin    scrambler therapy for  Neuropathy ask neurology   Nortriptyline capsules What is this medicine? NORTRIPTYLINE (nor TRIP ti leen) is used to treat depression. This medicine may be used for other purposes; ask your health care provider or pharmacist if you have questions. COMMON BRAND NAME(S): Aventyl, Pamelor What should I tell my health care provider before I take this medicine? They need to know if you have any of these conditions:  bipolar disorder  Brugada syndrome  difficulty passing urine  glaucoma  heart disease  if you drink alcohol  liver disease  schizophrenia  seizures  suicidal thoughts, plans or attempt; a previous suicide attempt by you or a family member  thyroid disease  an unusual or allergic reaction to nortriptyline, other tricyclic antidepressants, other medicines, foods, dyes, or preservatives  pregnant or trying to get pregnant  breast-feeding How should I use this medicine? Take this medicine by mouth with a glass of water. Follow the directions on the prescription label. Take your doses at regular intervals. Do not take it more often than directed. Do not stop taking this medicine suddenly except upon the advice of your doctor. Stopping this medicine too quickly may cause serious side effects or your condition may worsen. A special MedGuide will be given to you by the pharmacist with each prescription and refill. Be sure to read this information carefully each time. Talk to your pediatrician regarding the use of this medicine in children. Special care may be needed. Overdosage: If you think you have taken too much of this medicine contact a poison control center or emergency room at once. NOTE: This medicine is only for you. Do not share this  medicine with others. What if I miss a dose? If you miss a dose, take it as soon as you can. If it is almost time for your next dose, take only that dose. Do not take double or extra doses. What may interact with this medicine? Do not take this medicine with any of the following medications:  cisapride  dronedarone  linezolid  MAOIs like Carbex, Eldepryl, Marplan, Nardil, and Parnate  methylene blue (injected into a vein)  pimozide  thioridazine This medicine may also interact with the following medications:  alcohol  antihistamines for allergy, cough, and cold  atropine  certain medicines for bladder problems like oxybutynin, tolterodine  certain medicines for depression like amitriptyline, fluoxetine, sertraline  certain medicines for Parkinson's disease like benztropine, trihexyphenidyl  certain medicines for stomach problems like dicyclomine, hyoscyamine  certain medicines for travel sickness like scopolamine  chlorpropamide  cimetidine  ipratropium  other medicines that prolong the QT interval (an abnormal heart rhythm) like dofetilide  other medicines that can cause serotonin syndrome like St. John's Wort, fentanyl, lithium, tramadol, tryptophan, buspirone, and some medicines for headaches like sumatriptan or rizatriptan  quinidine  reserpine  thyroid medicine This list may not describe all possible interactions. Give your health care provider a list of all the medicines, herbs, non-prescription drugs, or dietary supplements you use. Also tell them if you smoke, drink alcohol, or use illegal drugs. Some items may interact with your medicine. What should I watch for while using this medicine? Tell your doctor if your symptoms do not get better or if they get worse. Visit your doctor  or health care professional for regular checks on your progress. Because it may take several weeks to see the full effects of this medicine, it is important to continue your  treatment as prescribed by your doctor. Patients and their families should watch out for new or worsening thoughts of suicide or depression. Also watch out for sudden changes in feelings such as feeling anxious, agitated, panicky, irritable, hostile, aggressive, impulsive, severely restless, overly excited and hyperactive, or not being able to sleep. If this happens, especially at the beginning of treatment or after a change in dose, call your health care professional. Dennis Bast may get drowsy or dizzy. Do not drive, use machinery, or do anything that needs mental alertness until you know how this medicine affects you. Do not stand or sit up quickly, especially if you are an older patient. This reduces the risk of dizzy or fainting spells. Alcohol may interfere with the effect of this medicine. Avoid alcoholic drinks. Do not treat yourself for coughs, colds, or allergies without asking your doctor or health care professional for advice. Some ingredients can increase possible side effects. Your mouth may get dry. Chewing sugarless gum or sucking hard candy, and drinking plenty of water may help. Contact your doctor if the problem does not go away or is severe. This medicine may cause dry eyes and blurred vision. If you wear contact lenses you may feel some discomfort. Lubricating drops may help. See your eye doctor if the problem does not go away or is severe. This medicine can cause constipation. Try to have a bowel movement at least every 2 to 3 days. If you do not have a bowel movement for 3 days, call your doctor or health care professional. This medicine can make you more sensitive to the sun. Keep out of the sun. If you cannot avoid being in the sun, wear protective clothing and use sunscreen. Do not use sun lamps or tanning beds/booths. What side effects may I notice from receiving this medicine? Side effects that you should report to your doctor or health care professional as soon as possible:  allergic  reactions like skin rash, itching or hives, swelling of the face, lips, or tongue  anxious  breathing problems  changes in vision  confusion  elevated mood, decreased need for sleep, racing thoughts, impulsive behavior  eye pain  fast, irregular heartbeat  feeling faint or lightheaded, falls  feeling agitated, angry, or irritable  fever with increased sweating  hallucination, loss of contact with reality  seizures  stiff muscles  suicidal thoughts or other mood changes  tingling, pain, or numbness in the feet or hands  trouble passing urine or change in the amount of urine  trouble sleeping  unusually weak or tired  vomiting  yellowing of the eyes or skin Side effects that usually do not require medical attention (report to your doctor or health care professional if they continue or are bothersome):  change in sex drive or performance  change in appetite or weight  constipation  dizziness  dry mouth  nausea  tired  tremors  upset stomach This list may not describe all possible side effects. Call your doctor for medical advice about side effects. You may report side effects to FDA at 1-800-FDA-1088. Where should I keep my medicine? Keep out of the reach of children. Store at room temperature between 15 and 30 degrees C (59 and 86 degrees F). Keep container tightly closed. Throw away any unused medicine after the expiration date. NOTE:  This sheet is a summary. It may not cover all possible information. If you have questions about this medicine, talk to your doctor, pharmacist, or health care provider.  2020 Elsevier/Gold Standard (2018-05-12 13:24:58)  Gabapentin capsules or tablets What is this medicine? GABAPENTIN (GA ba pen tin) is used to control seizures in certain types of epilepsy. It is also used to treat certain types of nerve pain. This medicine may be used for other purposes; ask your health care provider or pharmacist if you have  questions. COMMON BRAND NAME(S): Active-PAC with Gabapentin, Gabarone, Neurontin What should I tell my health care provider before I take this medicine? They need to know if you have any of these conditions:  history of drug abuse or alcohol abuse problem  kidney disease  lung or breathing disease  suicidal thoughts, plans, or attempt; a previous suicide attempt by you or a family member  an unusual or allergic reaction to gabapentin, other medicines, foods, dyes, or preservatives  pregnant or trying to get pregnant  breast-feeding How should I use this medicine? Take this medicine by mouth with a glass of water. Follow the directions on the prescription label. You can take it with or without food. If it upsets your stomach, take it with food. Take your medicine at regular intervals. Do not take it more often than directed. Do not stop taking except on your doctor's advice. If you are directed to break the 600 or 800 mg tablets in half as part of your dose, the extra half tablet should be used for the next dose. If you have not used the extra half tablet within 28 days, it should be thrown away. A special MedGuide will be given to you by the pharmacist with each prescription and refill. Be sure to read this information carefully each time. Talk to your pediatrician regarding the use of this medicine in children. While this drug may be prescribed for children as young as 3 years for selected conditions, precautions do apply. Overdosage: If you think you have taken too much of this medicine contact a poison control center or emergency room at once. NOTE: This medicine is only for you. Do not share this medicine with others. What if I miss a dose? If you miss a dose, take it as soon as you can. If it is almost time for your next dose, take only that dose. Do not take double or extra doses. What may interact with this medicine? This medicine may interact with the following  medications:  alcohol  antihistamines for allergy, cough, and cold  certain medicines for anxiety or sleep  certain medicines for depression like amitriptyline, fluoxetine, sertraline  certain medicines for seizures like phenobarbital, primidone  certain medicines for stomach problems  general anesthetics like halothane, isoflurane, methoxyflurane, propofol  local anesthetics like lidocaine, pramoxine, tetracaine  medicines that relax muscles for surgery  narcotic medicines for pain  phenothiazines like chlorpromazine, mesoridazine, prochlorperazine, thioridazine This list may not describe all possible interactions. Give your health care provider a list of all the medicines, herbs, non-prescription drugs, or dietary supplements you use. Also tell them if you smoke, drink alcohol, or use illegal drugs. Some items may interact with your medicine. What should I watch for while using this medicine? Visit your doctor or health care provider for regular checks on your progress. You may want to keep a record at home of how you feel your condition is responding to treatment. You may want to share this information with  your doctor or health care provider at each visit. You should contact your doctor or health care provider if your seizures get worse or if you have any new types of seizures. Do not stop taking this medicine or any of your seizure medicines unless instructed by your doctor or health care provider. Stopping your medicine suddenly can increase your seizures or their severity. This medicine may cause serious skin reactions. They can happen weeks to months after starting the medicine. Contact your health care provider right away if you notice fevers or flu-like symptoms with a rash. The rash may be red or purple and then turn into blisters or peeling of the skin. Or, you might notice a red rash with swelling of the face, lips or lymph nodes in your neck or under your arms. Wear a medical  identification bracelet or chain if you are taking this medicine for seizures, and carry a card that lists all your medications. You may get drowsy, dizzy, or have blurred vision. Do not drive, use machinery, or do anything that needs mental alertness until you know how this medicine affects you. To reduce dizzy or fainting spells, do not sit or stand up quickly, especially if you are an older patient. Alcohol can increase drowsiness and dizziness. Avoid alcoholic drinks. Your mouth may get dry. Chewing sugarless gum or sucking hard candy, and drinking plenty of water will help. The use of this medicine may increase the chance of suicidal thoughts or actions. Pay special attention to how you are responding while on this medicine. Any worsening of mood, or thoughts of suicide or dying should be reported to your health care provider right away. Women who become pregnant while using this medicine may enroll in the [place] Pregnancy Registry by calling [phone]. This registry collects information about the safety of antiepileptic drug use during pregnancy. What side effects may I notice from receiving this medicine? Side effects that you should report to your doctor or health care professional as soon as possible:  allergic reactions like skin rash, itching or hives, swelling of the face, lips, or tongue  breathing problems  rash, fever, and swollen lymph nodes  redness, blistering, peeling or loosening of the skin, including inside the mouth  suicidal thoughts, mood changes Side effects that usually do not require medical attention (report to your doctor or health care professional if they continue or are bothersome):  dizziness  drowsiness  headache  nausea, vomiting  swelling of ankles, feet, hands  tiredness This list may not describe all possible side effects. Call your doctor for medical advice about side effects. You may report side effects to FDA at  1-800-FDA-1088. Where should I keep my medicine? Keep out of reach of children. This medicine may cause accidental overdose and death if it taken by other adults, children, or pets. Mix any unused medicine with a substance like cat litter or coffee grounds. Then throw the medicine away in a sealed container like a sealed bag or a coffee can with a lid. Do not use the medicine after the expiration date. Store at room temperature between 15 and 30 degrees C (59 and 86 degrees F). NOTE: This sheet is a summary. It may not cover all possible information. If you have questions about this medicine, talk to your doctor, pharmacist, or health care provider.  2020 Elsevier/Gold Standard (2018-08-21 14:16:43)  Neuropathic Pain Neuropathic pain is pain caused by damage to the nerves that are responsible for certain sensations in  your body (sensory nerves). The pain can be caused by:  Damage to the sensory nerves that send signals to your spinal cord and brain (peripheral nervous system).  Damage to the sensory nerves in your brain or spinal cord (central nervous system). Neuropathic pain can make you more sensitive to pain. Even a minor sensation can feel very painful. This is usually a long-term condition that can be difficult to treat. The type of pain differs from person to person. It may:  Start suddenly (acute), or it may develop slowly and last for a long time (chronic).  Come and go as damaged nerves heal, or it may stay at the same level for years.  Cause emotional distress, loss of sleep, and a lower quality of life. What are the causes? The most common cause of this condition is diabetes. Many other diseases and conditions can also cause neuropathic pain. Causes of neuropathic pain can be classified as:  Toxic. This is caused by medicines and chemicals. The most common cause of toxic neuropathic pain is damage from cancer treatments (chemotherapy).  Metabolic. This can be caused  by: ? Diabetes. This is the most common disease that damages the nerves. ? Lack of vitamin B from long-term alcohol abuse.  Traumatic. Any injury that cuts, crushes, or stretches a nerve can cause damage and pain. A common example is feeling pain after losing an arm or leg (phantom limb pain).  Compression-related. If a sensory nerve gets trapped or compressed for a long period of time, the blood supply to the nerve can be cut off.  Vascular. Many blood vessel diseases can cause neuropathic pain by decreasing blood supply and oxygen to nerves.  Autoimmune. This type of pain results from diseases in which the body's defense system (immune system) mistakenly attacks sensory nerves. Examples of autoimmune diseases that can cause neuropathic pain include lupus and multiple sclerosis.  Infectious. Many types of viral infections can damage sensory nerves and cause pain. Shingles infection is a common cause of this type of pain.  Inherited. Neuropathic pain can be a symptom of many diseases that are passed down through families (genetic). What increases the risk? You are more likely to develop this condition if:  You have diabetes.  You smoke.  You drink too much alcohol.  You are taking certain medicines, including medicines that kill cancer cells (chemotherapy) or that treat immune system disorders. What are the signs or symptoms? The main symptom is pain. Neuropathic pain is often described as:  Burning.  Shock-like.  Stinging.  Hot or cold.  Itching. How is this diagnosed? No single test can diagnose neuropathic pain. It is diagnosed based on:  Physical exam and your symptoms. Your health care provider will ask you about your pain. You may be asked to use a pain scale to describe how bad your pain is.  Tests. These may be done to see if you have a high sensitivity to pain and to help find the cause and location of any sensory nerve damage. They include: ? Nerve conduction  studies to test how well nerve signals travel through your sensory nerves (electrodiagnostic testing). ? Stimulating your sensory nerves through electrodes on your skin and measuring the response in your spinal cord and brain (somatosensory evoked potential).  Imaging studies, such as: ? X-rays. ? CT scan. ? MRI. How is this treated? Treatment for neuropathic pain may change over time. You may need to try different treatment options or a combination of treatments. Some options include:  Treating the underlying cause of the neuropathy, such as diabetes, kidney disease, or vitamin deficiencies.  Stopping medicines that can cause neuropathy, such as chemotherapy.  Medicine to relieve pain. Medicines may include: ? Prescription or over-the-counter pain medicine. ? Anti-seizure medicine. ? Antidepressant medicines. ? Pain-relieving patches that are applied to painful areas of skin. ? A medicine to numb the area (local anesthetic), which can be injected as a nerve block.  Transcutaneous nerve stimulation. This uses electrical currents to block painful nerve signals. The treatment is painless.  Alternative treatments, such as: ? Acupuncture. ? Meditation. ? Massage. ? Physical therapy. ? Pain management programs. ? Counseling. Follow these instructions at home: Medicines   Take over-the-counter and prescription medicines only as told by your health care provider.  Do not drive or use heavy machinery while taking prescription pain medicine.  If you are taking prescription pain medicine, take actions to prevent or treat constipation. Your health care provider may recommend that you: ? Drink enough fluid to keep your urine pale yellow. ? Eat foods that are high in fiber, such as fresh fruits and vegetables, whole grains, and beans. ? Limit foods that are high in fat and processed sugars, such as fried or sweet foods. ? Take an over-the-counter or prescription medicine for  constipation. Lifestyle   Have a good support system at home.  Consider joining a chronic pain support group.  Do not use any products that contain nicotine or tobacco, such as cigarettes and e-cigarettes. If you need help quitting, ask your health care provider.  Do not drink alcohol. General instructions  Learn as much as you can about your condition.  Work closely with all your health care providers to find the treatment plan that works best for you.  Ask your health care provider what activities are safe for you.  Keep all follow-up visits as told by your health care provider. This is important. Contact a health care provider if:  Your pain treatments are not working.  You are having side effects from your medicines.  You are struggling with tiredness (fatigue), mood changes, depression, or anxiety. Summary  Neuropathic pain is pain caused by damage to the nerves that are responsible for certain sensations in your body (sensory nerves).  Neuropathic pain may come and go as damaged nerves heal, or it may stay at the same level for years.  Neuropathic pain is usually a long-term condition that can be difficult to treat. Consider joining a chronic pain support group. This information is not intended to replace advice given to you by your health care provider. Make sure you discuss any questions you have with your health care provider. Document Revised: 09/10/2018 Document Reviewed: 06/06/2017 Elsevier Patient Education  [employer].

## 2019-10-20 ENCOUNTER — Encounter: Payer: Self-pay | Admitting: Internal Medicine

## 2019-10-22 NOTE — Addendum Note (Signed)
Addended by: Orland Mustard on: 10/22/2019 02:50 PM   Modules accepted: Orders

## 2019-10-27 ENCOUNTER — Encounter: Payer: Self-pay | Admitting: Internal Medicine

## 2019-10-27 ENCOUNTER — Telehealth: Payer: Self-pay | Admitting: Internal Medicine

## 2019-10-27 DIAGNOSIS — Z1211 Encounter for screening for malignant neoplasm of colon: Secondary | ICD-10-CM

## 2019-10-27 NOTE — Telephone Encounter (Signed)
Patient returned call verified OK to order Cologuard Order placed.

## 2019-10-27 NOTE — Telephone Encounter (Signed)
Called to verify patient due for cologuard and to reorder. Unable to leave message.

## 2019-11-07 LAB — COLOGUARD: Cologuard: NEGATIVE

## 2019-11-10 ENCOUNTER — Other Ambulatory Visit: Payer: Self-pay | Admitting: Internal Medicine

## 2019-11-10 DIAGNOSIS — M79672 Pain in left foot: Secondary | ICD-10-CM

## 2019-11-10 DIAGNOSIS — G629 Polyneuropathy, unspecified: Secondary | ICD-10-CM

## 2019-11-17 ENCOUNTER — Ambulatory Visit (INDEPENDENT_AMBULATORY_CARE_PROVIDER_SITE_OTHER): Payer: Medicare Other

## 2019-11-17 VITALS — Ht 63.0 in | Wt 171.0 lb

## 2019-11-17 DIAGNOSIS — Z0000 Encounter for general adult medical examination without abnormal findings: Secondary | ICD-10-CM | POA: Diagnosis not present

## 2019-11-17 NOTE — Progress Notes (Addendum)
Subjective:   Ana Cruz is a 86 y.o. female who presents for Medicare Annual (Subsequent) preventive examination.  Review of Systems:  No ROS.  Medicare Wellness Virtual Visit.   Cardiac Risk Factors include: advanced age (>14men, >1 women);hypertension     Objective:     Vitals: Ht 5\' 3"  (1.6 m)   Wt 171 lb (77.6 kg)   BMI 30.29 kg/m   Body mass index is 30.29 kg/m.  Advanced Directives 11/17/2019 02/22/2019 11/16/2018 12/04/2017 09/11/2017 01/20/2017 09/10/2016  Does Patient Have a Medical Advance Directive? Yes Yes Yes No Yes Yes Yes  Type of Paramedic of [place];Living will - [place];Living will - [place];Living will [place];Living will [place];Living will  Does patient want to make changes to medical advance directive? No - Patient declined - No - Patient declined - No - Patient declined - No - Patient declined  Copy of [place] in Chart? No - copy requested - No - copy requested - No - copy requested - No - copy requested  Would patient like information on creating a medical advance directive? - - - No - Patient declined - - -    Tobacco Social History   Tobacco Use  Smoking Status Never Smoker  Smokeless Tobacco Never Used     Counseling given: Not Answered   Clinical Intake:  Pre-visit preparation completed: Yes        Diabetes: No  How often do you need to have someone help you when you read instructions, pamphlets, or other written materials from your doctor or pharmacy?: 1 - Never  Interpreter Needed?: No     Past Medical History:  Diagnosis Date  . allergic rhinitis   . Atrial fibrillation ([place])   . Chronic kidney disease, stage I    mild, did improve with cessation of NSAIDs  . Elbow fracture, left    repaired Jan 2000  . Hyperlipidemia   . Hypertension   . hypothyroidism    medicated since age 89, no  prior surgery  . 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 ABDOMINAL HYSTERECTOMY  1979  . APPENDECTOMY  1969  . BILATERAL OOPHORECTOMY     [place]  . BLADDER REPAIR  1980  . OVARIAN CYST REMOVAL  April 1969  . REPLACEMENT TOTAL KNEE BILATERAL  2009  . reverse shoulder surgery  04/05/2016  . ROTATOR CUFF REPAIR  Jan 2000  . ROTATOR CUFF REPAIR  2000   Family History  Problem Relation Age of Onset  . Hypertension Mother   . Dementia Mother   . Heart disease Father 65       MI  . Cancer Paternal Grandfather 70       colon  . Breast cancer Neg Hx    Social History   Socioeconomic History  . Marital status: Married    Spouse name: Not on file  . Number of children: 4  . Years of education: 13.5  . Highest education level: Not on file  Occupational History  . Occupation: retired  Tobacco Use  . Smoking status: Never Smoker  . Smokeless tobacco: Never Used  Vaping Use  . Vaping Use: Never used  Substance and Sexual Activity  . Alcohol use: Not Currently  . Drug use: No  . Sexual activity: Not on file  Other Topics Concern  . Not on file  Social History Narrative   Married    [place]ives [place]    Right handed   One story   Social Determinants of Health   Financial Resource Strain:   . Difficulty of Paying Living Expenses:   Food Insecurity:   . Worried About Charity fundraiser in the Last Year:   . Arboriculturist in the Last Year:   Transportation Needs:   . Film/video editor (Medical):   [name] Lack of Transportation (Non-Medical):   Physical Activity:   . Days of Exercise per Week:   . Minutes of Exercise per Session:   Stress:   . Feeling of Stress :   Social Connections:   . Frequency of Communication with Friends and Family:   . Frequency of Social Gatherings with Friends and Family:   . Attends Religious Services:   . Active Member of Clubs or Organizations:     . Attends Archivist Meetings:   [name] Marital Status:     Outpatient Encounter Medications as of 11/17/2019  Medication Sig  . acetaminophen (TYLENOL) 500 MG tablet Take 500 mg by mouth every 6 (six) hours as needed (As needed for pain).  [name] amoxicillin (AMOXIL) 500 MG capsule Take 500 mg by mouth. Take four tablets prior to dental work.  . Artificial Tear Ointment (EYE LUBRICANT OP) Apply 1 drop to eye 2 (two) times daily.  . Cholecalciferol (VITAMIN D3) 1000 UNITS CAPS Take 1,000 Units by mouth daily.   [name] diltiazem (CARDIZEM) 30 MG tablet Take 30 mg by mouth 2 (two) times daily as needed.  [name] levothyroxine (SYNTHROID) 88 MCG tablet TAKE 1 TABLET (88 MCG TOTAL) BY MOUTH DAILY BEFORE BREAKFAST.  [name] nortriptyline (PAMELOR) 10 MG capsule TAKE 1 CAPSULE (10 MG TOTAL) BY MOUTH AT BEDTIME.  . pravastatin (PRAVACHOL) 20 MG tablet TAKE 1 TABLET BY MOUTH EVERY DAY  . Probiotic Product (ACIDOPHILUS/GOAT MILK) CAPS Take by mouth.  . telmisartan (MICARDIS) 40 MG tablet Take 80 mg by mouth daily.   . vitamin B-12 (CYANOCOBALAMIN) 1000 MCG tablet Take 1 tablet (1,000 mcg total) by mouth daily.  [name] warfarin (COUMADIN) 1 MG tablet Take 1 mg by mouth daily.    No facility-administered encounter medications on file as of 11/17/2019.    Activities of Daily Living In your present state of health, do you have any difficulty performing the following activities: 11/17/2019  Hearing? N  Vision? N  Difficulty concentrating or making decisions? N  Walking or climbing stairs? Y  Comment Unsteady gait, cane in use  Dressing or bathing? N  Doing errands, shopping? N  Preparing Food and eating ? N  Using the Toilet? N  In the past six months, have you accidently leaked urine? N  Do you have problems with loss of bowel control? N  Managing your Medications? N  Managing your Finances? N  Housekeeping or managing your Housekeeping? N  Some recent data might be hidden    Patient Care Team: Crecencio Mc, MD as PCP - General (Internal Medicine) Alda Berthold, DO as Consulting Physician (Neurology)    Assessment:   This is a routine wellness examination for Ana Cruz.  I connected with Ana Cruz today by telephone and verified that I am speaking with the correct person using two identifiers. Location patient: home Location provider: work Persons participating in the virtual visit: patient, Marine scientist.    I discussed the limitations, risks, security  and privacy concerns of performing an evaluation and management service by telephone and the availability of in person appointments. The patient expressed understanding and verbally consented to this telephonic visit.    Interactive audio and video telecommunications were attempted between this provider and patient, however failed, due to patient having technical difficulties OR patient did not have access to video capability.  We continued and completed visit with audio only.  Some vital signs may be absent or patient reported.   Health Maintenance Due: -Tdap vaccine- discussed; to be completed with doctor in visit or local pharmacy.   See completed HM at the end of note.   Eye: Visual acuity not assessed. Virtual visit. Followed by their ophthalmologist.  Dental: Visits every 6 months.    Hearing: Demonstrates normal hearing during visit.  Safety:  Patient feels safe at home- yes Patient does have smoke detectors at home- yes Patient does wear sunscreen or protective clothing when in direct sunlight - yes Patient does wear seat belt when in a moving vehicle - yes Patient drives- yes Adequate lighting in walkways free from debris- yes Grab bars and handrails used as appropriate- yes Ambulates with an assistive device- yes; cane Cell phone on person when ambulating outside of the home- yes Life alert pendant- worn on the premises Rolling cart in use; needed to lift/carry items-yes  Social: Alcohol intake - not currently      Smoking  history- never   Smokers in home? none Illicit drug use? none  Medication: Taking as directed and without issues.  Self managed - yes   Covid-19: Precautions and sickness symptoms discussed. Wears mask, social distancing, hand hygiene as appropriate.   Activities of Daily Living Patient denies needing assistance with: household chores, feeding themselves, getting from bed to chair, getting to the toilet, bathing/showering, dressing, managing money, or preparing meals.   Discussed the importance of a healthy diet, water intake and the benefits of aerobic exercise.  Physical activity- limited  Diet:  Low carb diet Water: good intake Caffeine: none  Other Providers Patient Care Team: Crecencio Mc, MD as PCP - General (Internal Medicine) Alda Berthold, DO as Consulting Physician (Neurology)  Exercise Activities and Dietary recommendations Current Exercise Habits: Home exercise routine, Intensity: Mild  Goals      Patient Stated   .  Increase physical activity (pt-stated)      Use stationary bike       Fall Risk Fall Risk  10/19/2019 08/11/2019 02/22/2019 11/16/2018 01/30/2018  Falls in the past year? 0 0 0 0 No  Number falls in past yr: 0 - 0 - -  Injury with Fall? 0 - 0 - -  Risk for fall due to : Impaired balance/gait;Impaired mobility - - - -  Follow up Falls evaluation completed Falls evaluation completed - - -   Is the patient's home free of loose throw rugs in walkways, pet beds, electrical cords, etc?  Yes      Grab bars in the bathroom? Yes      Handrails on the stairs? Yes      Adequate lighting?  Yes  Timed Get Up and Go performed: No, virtual visit  Depression Screen PHQ 2/9 Scores 11/17/2019 11/16/2018 01/30/2018 09/11/2017  PHQ - 2 Score 0 0 0 0     Cognitive Function  Patient is alert and oriented x3. Patient denies difficulty focusing or concentrating. Patient likes to read, quilt, cross stitch and I learning how to knit for brain health.  6CIT  Screen 11/17/2019 11/16/2018 09/10/2016  What Year? 0 points 0 points 0 points  What month? 0 points 0 points 0 points  What time? - 0 points 0 points  Count back from 20 - 0 points 0 points  Months in reverse 0 points 0 points 0 points  Repeat phrase 0 points 0 points 0 points  Total Score - 0 0    Immunization History  Administered Date(s) Administered  . Influenza Split 03/31/2012, 04/01/2013, 02/22/2014  . Influenza Whole 03/05/2011  . Influenza, High Dose Seasonal PF 02/20/2016, 03/14/2017  . Influenza,inj,Quad PF,6+ Mos 02/21/2015  . Influenza-Unspecified 03/19/2018, 03/18/2019  . Moderna SARS-COVID-2 Vaccination 06/18/2019, 07/16/2019  . Pneumococcal Conjugate-13 08/02/2013  . Pneumococcal Polysaccharide-23 04/09/1999, 04/09/2011  . Tdap 09/19/2008  . Zoster 01/20/2007  . Zoster Recombinat (Shingrix) 02/15/2019, 03/17/2019   Screening Tests Health Maintenance  Topic Date Due  . TETANUS/TDAP  09/20/2018  . INFLUENZA VACCINE  01/02/2020  . DEXA SCAN  Completed  . COVID-19 Vaccine  Completed  . PNA vac Low Risk Adult  Completed    Cancer Screenings: Lung: Low Dose CT Chest recommended if Age 12-80 years, 30 pack-year currently smoking OR have quit w/in 15years. Patient does not qualify.     Plan:   Keep all routine maintenance appointments.   Follow up 11/18/19  Medicare Attestation I have personally reviewed: The patient's medical and social history Their use of alcohol, tobacco or illicit drugs Their current medications and supplements The patient's functional ability including ADLs,fall risks, home safety risks, cognitive, and hearing and visual impairment Diet and physical activities Evidence for depression   I have reviewed and discussed with patient certain preventive protocols, quality metrics, and best practice recommendations.      OBrien-Blaney, Taylie Helder L, LPN  6/60/6301   I have reviewed the above information and agree with above.   Deborra Medina,  MD

## 2019-11-17 NOTE — Patient Instructions (Addendum)
  Ms. Ana Cruz , Thank you for taking time to come for your Medicare Wellness Visit. I appreciate your ongoing commitment to your health goals. Please review the following plan we discussed and let me know if I can assist you in the future.   These are the goals we discussed: Goals      Patient Stated   .  Increase physical activity (pt-stated)      Use stationary bike       This is a list of the screening recommended for you and due dates:  Health Maintenance  Topic Date Due  . Tetanus Vaccine  09/20/2018  . Flu Shot  01/02/2020  . DEXA scan (bone density measurement)  Completed  . COVID-19 Vaccine  Completed  . Pneumonia vaccines  Completed

## 2019-11-18 ENCOUNTER — Encounter: Payer: Self-pay | Admitting: Internal Medicine

## 2019-11-18 ENCOUNTER — Ambulatory Visit (INDEPENDENT_AMBULATORY_CARE_PROVIDER_SITE_OTHER): Payer: Medicare Other | Admitting: Internal Medicine

## 2019-11-18 ENCOUNTER — Other Ambulatory Visit: Payer: Self-pay

## 2019-11-18 VITALS — BP 128/66 | HR 70 | Temp 97.1°F | Resp 15 | Ht 63.0 in | Wt 169.2 lb

## 2019-11-18 DIAGNOSIS — E782 Mixed hyperlipidemia: Secondary | ICD-10-CM

## 2019-11-18 DIAGNOSIS — M79672 Pain in left foot: Secondary | ICD-10-CM

## 2019-11-18 DIAGNOSIS — G6289 Other specified polyneuropathies: Secondary | ICD-10-CM

## 2019-11-18 DIAGNOSIS — M5416 Radiculopathy, lumbar region: Secondary | ICD-10-CM

## 2019-11-18 DIAGNOSIS — R7303 Prediabetes: Secondary | ICD-10-CM

## 2019-11-18 DIAGNOSIS — G629 Polyneuropathy, unspecified: Secondary | ICD-10-CM | POA: Diagnosis not present

## 2019-11-18 DIAGNOSIS — M48061 Spinal stenosis, lumbar region without neurogenic claudication: Secondary | ICD-10-CM

## 2019-11-18 MED ORDER — NORTRIPTYLINE HCL 10 MG PO CAPS: 20.0000 mg | ORAL_CAPSULE | Freq: Every day | ORAL | 1 refills | Status: DC

## 2019-11-18 MED ORDER — TETANUS-DIPHTHERIA TOXOIDS TD 5-2 LFU IM INJ: 0.5000 mL | INJECTION | Freq: Once | INTRAMUSCULAR | 0 refills | Status: AC

## 2019-11-18 NOTE — Progress Notes (Signed)
Subjective:  Patient ID: Ana Cruz, female    DOB: 1933-10-25  Age: 86 y.o. MRN: [id number]  CC: The primary encounter diagnosis was Mixed hyperlipidemia. Diagnoses of Left foot pain, Neuropathy, Other polyneuropathy, Spinal stenosis of lumbar region with radiculopathy, and Prediabetes were also pertinent to this visit.  HPI Ana Cruz presents for follow up on chronic conditions including neuropathy involving the left leg and hypertension    This visit occurred during the SARS-CoV-2 public health emergency.  Safety protocols were in place, including screening questions prior to the visit, additional usage of staff PPE, and extensive cleaning of exam room while observing appropriate contact time as indicated for disinfecting solutions.   1) Neuropathy:  She has historically avoided gabapentin for management of symptoms due to husband's intolerance.  She was evaluated by Dr Aundra Dubin and started on Pamelor.  Taking 20 mg Pamelor  At bedtime.  30 mg dose gives her urinary incontinence .  Referred to Dr. Posey Pronto but saw Melrose Nakayama for neuropathy.  EMG not repeated, Severe by prior EMG studies    2) Patient is taking her medications as prescribed and notes no adverse effects.  Home BP readings have been done daily for the past 3 weeks  week and are  generally < 130/80 .  She is avoiding added salt in her diet and walking regularly about 3 times per week for exercise  .BP reviewed  3) cologuard sent in by patient 10 days ago . Results pending    Outpatient Medications Prior to Visit  Medication Sig Dispense Refill  . acetaminophen (TYLENOL) 500 MG tablet Take 500 mg by mouth every 6 (six) hours as needed (As needed for pain).    [name] amoxicillin (AMOXIL) 500 MG capsule Take 500 mg by mouth. Take four tablets prior to dental work.    . Artificial Tear Ointment (EYE LUBRICANT OP) Apply 1 drop to eye 2 (two) times daily.    . Cholecalciferol (VITAMIN D3) 1000 UNITS CAPS Take 1,000 Units by  mouth daily.     [name] diltiazem (CARDIZEM) 30 MG tablet Take 30 mg by mouth 2 (two) times daily as needed.    [name] levothyroxine (SYNTHROID) 88 MCG tablet TAKE 1 TABLET (88 MCG TOTAL) BY MOUTH DAILY BEFORE BREAKFAST. 90 tablet 1  . pravastatin (PRAVACHOL) 20 MG tablet TAKE 1 TABLET BY MOUTH EVERY DAY 90 tablet 3  . Probiotic Product (ACIDOPHILUS/GOAT MILK) CAPS Take by mouth.    . telmisartan (MICARDIS) 40 MG tablet Take 80 mg by mouth daily.     . vitamin B-12 (CYANOCOBALAMIN) 1000 MCG tablet Take 1 tablet (1,000 mcg total) by mouth daily. 90 tablet 3  . warfarin (COUMADIN) 1 MG tablet Take 1 mg by mouth daily.     . nortriptyline (PAMELOR) 10 MG capsule TAKE 1 CAPSULE (10 MG TOTAL) BY MOUTH AT BEDTIME. 90 capsule 1   No facility-administered medications prior to visit.    Review of Systems;  Patient denies headache, fevers, malaise, unintentional weight loss, skin rash, eye pain, sinus congestion and sinus pain, sore throat, dysphagia,  hemoptysis , cough, dyspnea, wheezing, chest pain, palpitations, orthopnea, edema, abdominal pain, nausea, melena, diarrhea, constipation, flank pain, dysuria, hematuria, urinary  Frequency, nocturia, numbness, tingling, seizures,  Focal weakness, Loss of consciousness,  Tremor, insomnia, depression, anxiety, and suicidal ideation.      Objective:  BP 128/66 (BP Location: Left Arm, Patient Position: Sitting, Cuff Size: Large)   Pulse 70   Temp (!) 97.1 F (36.2  C) (Temporal)   Resp 15   Ht 5\' 3"  (1.6 m)   Wt 169 lb 3.2 oz (76.7 kg)   SpO2 97%   BMI 29.97 kg/m   BP Readings from Last 3 Encounters:  11/18/19 128/66  10/19/19 140/72  08/11/19 (!) 156/84    Wt Readings from Last 3 Encounters:  11/18/19 169 lb 3.2 oz (76.7 kg)  11/17/19 171 lb (77.6 kg)  10/19/19 171 lb 12.8 oz (77.9 kg)    General appearance: alert, cooperative and appears stated age Ears: normal TM's and external ear canals both ears Throat: lips, mucosa, and tongue normal; teeth  and gums normal Neck: no adenopathy, no carotid bruit, supple, symmetrical, trachea midline and thyroid not enlarged, symmetric, no tenderness/mass/nodules Back: symmetric, no curvature. ROM normal. No CVA tenderness. Lungs: clear to auscultation bilaterally Heart: regular rate and rhythm, S1, S2 normal, no murmur, click, rub or gallop Abdomen: soft, non-tender; bowel sounds normal; no masses,  no organomegaly Pulses: 2+ and symmetric Skin: Skin color, texture, turgor normal. No rashes or lesions Lymph nodes: Cervical, supraclavicular, and axillary nodes normal.  Lab Results  Component Value Date   HGBA1C 5.8 01/05/2019   HGBA1C 6.0 06/26/2018   HGBA1C 5.9 08/20/2016    Lab Results  Component Value Date   CREATININE 1.2 (A) 07/26/2019   CREATININE 1.08 01/05/2019   CREATININE 1.18 (H) 06/26/2018    Lab Results  Component Value Date   WBC 5.1 01/05/2019   HGB 10.7 (A) 07/26/2019   HCT 35.4 (L) 01/05/2019   PLT 259.0 01/05/2019   GLUCOSE 99 01/05/2019   CHOL 216 (H) 01/05/2019   TRIG 106.0 01/05/2019   HDL 70.50 01/05/2019   LDLDIRECT 107.0 03/14/2017   LDLCALC 124 (H) 01/05/2019   ALT 11 01/05/2019   AST 16 01/05/2019   NA 140 01/05/2019   K 4.5 01/05/2019   CL 108 01/05/2019   CREATININE 1.2 (A) 07/26/2019   BUN 34 (A) 07/26/2019   CO2 24 01/05/2019   TSH 0.82 01/05/2019   HGBA1C 5.8 01/05/2019    MM 3D SCREEN BREAST BILATERAL  Result Date: 05/06/2019 CLINICAL DATA:  Screening. EXAM: DIGITAL SCREENING BILATERAL MAMMOGRAM WITH TOMO AND CAD COMPARISON:  Previous exam(s). ACR Breast Density Category b: There are scattered areas of fibroglandular density. FINDINGS: There are no findings suspicious for malignancy. Images were processed with CAD. IMPRESSION: No mammographic evidence of malignancy. A result letter of this screening mammogram will be mailed directly to the patient. RECOMMENDATION: Screening mammogram in one year. (Code:SM-B-01Y) BI-RADS CATEGORY  1:  Negative. Electronically Signed   By: Dorise Bullion III M.D   On: 05/06/2019 17:54    Assessment & Plan:   Problem List Items Addressed This Visit      Unprioritized   Hyperlipidemia - Primary   Relevant Orders   Comprehensive metabolic panel   Lipid panel   Peripheral neuropathy    Etiology unclear,  despite neurology's confirmation of severe neuropathy by studies done Sept 2019.  Likely multifactorial including B12 deficiency and lumbar spinal stenosis .  She is tolerating Pamelor at the 20 mg dose.       Relevant Medications   nortriptyline (PAMELOR) 10 MG capsule   Prediabetes    Her  random glucose was mildly elevated in August and her A1c suggested she is at risk for developing diabetes.  I recommend  that she follow a low glycemic index diet and particpate regularly in an aerobic  exercise activity.  We will repeat  her A1c  Lab Results  Component Value Date   HGBA1C 5.8 01/05/2019         Relevant Orders   Hemoglobin A1c   Spinal stenosis of lumbar region with radiculopathy    She has a history of lumbar radiculitis that resolved in 2016 with prednisone and tramadol, prescribed by Dr Sharlet Salina , and did not receive an ESI.  Last MRI of lumbar spine was in 2016 and noted borderline mild central stenosis at L3 . The last time she developed lower extremity weakness , she benefitted  From PT at [facility] to improve her balance and proximal muscle strength.       Relevant Medications   nortriptyline (PAMELOR) 10 MG capsule    Other Visit Diagnoses    Left foot pain       Relevant Medications   nortriptyline (PAMELOR) 10 MG capsule   Neuropathy       Relevant Medications   nortriptyline (PAMELOR) 10 MG capsule      I have changed Ana Cruz's nortriptyline. I am also having her start on tetanus & diphtheria toxoids (adult). Additionally, I am having her maintain her Vitamin D3, amoxicillin, warfarin, acetaminophen, Artificial Tear Ointment (EYE LUBRICANT  OP), telmisartan, vitamin B-12, pravastatin, levothyroxine, diltiazem, and Acidophilus/Goat Milk.  Meds ordered this encounter  Medications  . nortriptyline (PAMELOR) 10 MG capsule    Sig: Take 2 capsules (20 mg total) by mouth at bedtime.    Dispense:  180 capsule    Refill:  1    NOT DOSE INCREASE.  KEEP ON FILE FOR FUTURE REFILLS DUE AROUND END OF July  . tetanus & diphtheria toxoids, adult, (TENIVAC) 5-2 LFU injection    Sig: Inject 0.5 mLs into the muscle once for 1 dose.    Dispense:  0.5 mL    Refill:  0    Medications Discontinued During This Encounter  Medication Reason  . nortriptyline (PAMELOR) 10 MG capsule Reorder    Follow-up: Return in about 6 months (around 05/19/2020).   Crecencio Mc, MD

## 2019-11-18 NOTE — Patient Instructions (Addendum)
YOU NEED THE TETANUS VACCINE .  RX PRINTED FOR PHARMACY  RETURN IN [place] FOR FASTING LABS  RETURN TO SEE ME IN December  '  Low carb prepared meals:  Healthy choice low carb power bowls  [employer] and [employer] both make Cauliflower pizza bowls   [employer] Cl[employer] makes Frittatas and eggwhiches

## 2019-11-21 NOTE — Assessment & Plan Note (Signed)
Etiology unclear,  despite neurology's confirmation of severe neuropathy by studies done Sept 2019.  Likely multifactorial including B12 deficiency and lumbar spinal stenosis .  She is tolerating Pamelor at the 20 mg dose.

## 2019-11-21 NOTE — Assessment & Plan Note (Signed)
Her  random glucose was mildly elevated in August and her A1c suggested she is at risk for developing diabetes.  I recommend  that she follow a low glycemic index diet and particpate regularly in an aerobic  exercise activity.  We will repeat her A1c  Lab Results  Component Value Date   HGBA1C 5.8 01/05/2019

## 2019-11-21 NOTE — Assessment & Plan Note (Addendum)
She has a history of lumbar radiculitis that resolved in 2016 with prednisone and tramadol, prescribed by Dr Sharlet Salina , and did not receive an ESI.  Last MRI of lumbar spine was in 2016 and noted borderline mild central stenosis at L3 . The last time she developed lower extremity weakness , she benefitted  From PT at [facility] to improve her balance and proximal muscle strength.

## 2019-11-23 LAB — COLOGUARD: Cologuard: NEGATIVE

## 2019-12-06 ENCOUNTER — Other Ambulatory Visit: Payer: Self-pay | Admitting: Internal Medicine

## 2019-12-06 DIAGNOSIS — E039 Hypothyroidism, unspecified: Secondary | ICD-10-CM

## 2019-12-16 ENCOUNTER — Ambulatory Visit (INDEPENDENT_AMBULATORY_CARE_PROVIDER_SITE_OTHER): Payer: Medicare Other | Admitting: Podiatry

## 2019-12-16 ENCOUNTER — Other Ambulatory Visit: Payer: Self-pay

## 2019-12-16 ENCOUNTER — Encounter: Payer: Self-pay | Admitting: Podiatry

## 2019-12-16 DIAGNOSIS — M79676 Pain in unspecified toe(s): Secondary | ICD-10-CM | POA: Diagnosis not present

## 2019-12-16 DIAGNOSIS — B351 Tinea unguium: Secondary | ICD-10-CM | POA: Diagnosis not present

## 2019-12-16 DIAGNOSIS — D689 Coagulation defect, unspecified: Secondary | ICD-10-CM

## 2019-12-16 DIAGNOSIS — L608 Other nail disorders: Secondary | ICD-10-CM

## 2019-12-16 NOTE — Progress Notes (Signed)
Complaint:  Visit Type: Patient returns to my office for continued preventative foot care services. Complaint: Patient states" my nails have grown long and thick and become painful to walk and wear shoes" . The patient presents for preventative foot care services. No changes to ROS.  Patient is taking coumadin.  Podiatric Exam: Vascular: dorsalis pedis and posterior tibial pulses are palpable bilateral. Capillary return is immediate. Temperature gradient is WNL. Skin turgor WNL  Sensorium: Normal Semmes Weinstein monofilament test. Normal tactile sensation bilaterally. Nail Exam: Pt has thick disfigured discolored nails with subungual debris noted bilateral entire nail hallux through fifth toenails.  Pincer toenail right hallux. Ulcer Exam: There is no evidence of ulcer or pre-ulcerative changes or infection. Orthopedic Exam: Muscle tone and strength are WNL. No limitations in general ROM. No crepitus or effusions noted. Foot type and digits show no abnormalities. HAV  B/L.  Hammer toe B/L. Skin:  . No infection or ulcers.     Diagnosis:  Onychomycosis, , Pain in right toe, pain in left toes   Treatment & Plan Procedures and Treatment: Consent by patient was obtained for treatment procedures.   Debridement of mycotic and hypertrophic toenails, 1 through 5 bilateral and clearing of subungual debris. No ulceration, no infection noted.  Return Visit-Office Procedure: Patient instructed to return to the office for a follow up visit 10 weeks  for continued evaluation and treatment.    Gardiner Barefoot DPM

## 2020-01-18 ENCOUNTER — Other Ambulatory Visit: Payer: Self-pay

## 2020-01-18 ENCOUNTER — Other Ambulatory Visit (INDEPENDENT_AMBULATORY_CARE_PROVIDER_SITE_OTHER): Payer: Medicare Other

## 2020-01-18 DIAGNOSIS — E559 Vitamin D deficiency, unspecified: Secondary | ICD-10-CM | POA: Diagnosis not present

## 2020-01-18 DIAGNOSIS — E034 Atrophy of thyroid (acquired): Secondary | ICD-10-CM | POA: Diagnosis not present

## 2020-01-18 DIAGNOSIS — R7303 Prediabetes: Secondary | ICD-10-CM | POA: Diagnosis not present

## 2020-01-18 DIAGNOSIS — E782 Mixed hyperlipidemia: Secondary | ICD-10-CM

## 2020-01-18 DIAGNOSIS — N189 Chronic kidney disease, unspecified: Secondary | ICD-10-CM | POA: Diagnosis not present

## 2020-01-18 DIAGNOSIS — D631 Anemia in chronic kidney disease: Secondary | ICD-10-CM | POA: Diagnosis not present

## 2020-01-18 LAB — COMPREHENSIVE METABOLIC PANEL
ALT: 10 U/L (ref 0–35)
AST: 16 U/L (ref 0–37)
Albumin: 3.9 g/dL (ref 3.5–5.2)
Alkaline Phosphatase: 89 U/L (ref 39–117)
BUN: 41 mg/dL — ABNORMAL HIGH (ref 6–23)
CO2: 27 mEq/L (ref 19–32)
Calcium: 9.4 mg/dL (ref 8.4–10.5)
Chloride: 103 mEq/L (ref 96–112)
Creatinine, Ser: 1.26 mg/dL — ABNORMAL HIGH (ref 0.40–1.20)
GFR: 40.41 mL/min — ABNORMAL LOW (ref >60.00)
Glucose, Bld: 101 mg/dL — ABNORMAL HIGH (ref 70–99)
Potassium: 4.6 mEq/L (ref 3.5–5.1)
Sodium: 137 mEq/L (ref 135–145)
Total Bilirubin: 0.5 mg/dL (ref 0.2–1.2)
Total Protein: 7 g/dL (ref 6.0–8.3)

## 2020-01-18 LAB — VITAMIN D 25 HYDROXY (VIT D DEFICIENCY, FRACTURES): VITD: 44.63 ng/mL (ref 30.00–100.00)

## 2020-01-18 LAB — CBC WITH DIFFERENTIAL/PLATELET
Basophils Absolute: 0 10*3/uL (ref 0.0–0.1)
Basophils Relative: 0.3 % (ref 0.0–3.0)
Eosinophils Absolute: 0.3 10*3/uL (ref 0.0–0.7)
Eosinophils Relative: 4 % (ref 0.0–5.0)
HCT: 37.6 % (ref 36.0–46.0)
Hemoglobin: 12.3 g/dL (ref 12.0–15.0)
Lymphocytes Relative: 31.9 % (ref 12.0–46.0)
Lymphs Abs: 2.3 10*3/uL (ref 0.7–4.0)
MCHC: 32.7 g/dL (ref 30.0–36.0)
MCV: 94.5 fl (ref 78.0–100.0)
Monocytes Absolute: 0.6 10*3/uL (ref 0.1–1.0)
Monocytes Relative: 8.8 % (ref 3.0–12.0)
Neutro Abs: 4 10*3/uL (ref 1.4–7.7)
Neutrophils Relative %: 55 % (ref 43.0–77.0)
Platelets: 262 10*3/uL (ref 150.0–400.0)
RBC: 3.98 Mil/uL (ref 3.87–5.11)
RDW: 14.1 % (ref 11.5–15.5)
WBC: 7.3 10*3/uL (ref 4.0–10.5)

## 2020-01-18 LAB — LIPID PANEL
Cholesterol: 224 mg/dL — ABNORMAL HIGH (ref 0–200)
HDL: 70.4 mg/dL (ref >39.00)
LDL Cholesterol: 136 mg/dL — ABNORMAL HIGH (ref 0–99)
NonHDL: 153.33
Total CHOL/HDL Ratio: 3
Triglycerides: 89 mg/dL (ref 0.0–149.0)
VLDL: 17.8 mg/dL (ref 0.0–40.0)

## 2020-01-18 LAB — HEMOGLOBIN A1C: Hgb A1c MFr Bld: 5.8 % (ref 4.6–6.5)

## 2020-01-18 LAB — TSH: TSH: 0.72 u[IU]/mL (ref 0.35–4.50)

## 2020-02-24 ENCOUNTER — Ambulatory Visit: Payer: Medicare Other | Admitting: Podiatry

## 2020-02-28 ENCOUNTER — Other Ambulatory Visit: Payer: Self-pay

## 2020-02-28 ENCOUNTER — Ambulatory Visit (INDEPENDENT_AMBULATORY_CARE_PROVIDER_SITE_OTHER): Payer: Medicare Other | Admitting: Podiatry

## 2020-02-28 ENCOUNTER — Encounter: Payer: Self-pay | Admitting: Podiatry

## 2020-02-28 DIAGNOSIS — M79676 Pain in unspecified toe(s): Secondary | ICD-10-CM

## 2020-02-28 DIAGNOSIS — B351 Tinea unguium: Secondary | ICD-10-CM

## 2020-02-28 DIAGNOSIS — D689 Coagulation defect, unspecified: Secondary | ICD-10-CM | POA: Diagnosis not present

## 2020-02-28 DIAGNOSIS — L608 Other nail disorders: Secondary | ICD-10-CM

## 2020-02-28 NOTE — Progress Notes (Signed)
This patient returns to my office for at risk foot care.  This patient requires this care by a professional since this patient will be at risk due to having peripheral neuropathy and venous insufficiency and chronic kidney disease..This patient is unable to cut nails herself since the patient cannot reach her nails.These nails are painful walking and wearing shoes.  This patient presents for at risk foot care today.  General Appearance  Alert, conversant and in no acute stress.  Vascular  Dorsalis pedis and posterior tibial  pulses are palpable  bilaterally.  Capillary return is within normal limits  bilaterally. Temperature is within normal limits  bilaterally.  Neurologic  Senn-Weinstein monofilament wire test within normal limits  bilaterally. Muscle power within normal limits bilaterally.  Nails Thick disfigured discolored nails with subungual debris  from hallux to fifth toes bilaterally. No evidence of bacterial infection or drainage bilaterally. Pincer hallux nails  Orthopedic  No limitations of motion  feet .  No crepitus or effusions noted.  No bony pathology or digital deformities noted.  Hallux limitus 1st MPJ mright.  Skin  normotropic skin with no porokeratosis noted bilaterally.  No signs of infections or ulcers noted.     Onychomycosis  Pain in right toes  Pain in left toes  Consent was obtained for treatment procedures.   Mechanical debridement of nails 1-5  bilaterally performed with a nail nipper.  Filed with dremel without incident.    Return office visit    11 weeks                  Told patient to return for periodic foot care and evaluation due to potential at risk complications.   Gardiner Barefoot DPM

## 2020-03-28 ENCOUNTER — Other Ambulatory Visit: Payer: Self-pay | Admitting: Internal Medicine

## 2020-05-11 ENCOUNTER — Ambulatory Visit (INDEPENDENT_AMBULATORY_CARE_PROVIDER_SITE_OTHER): Payer: Medicare Other | Admitting: Podiatry

## 2020-05-11 ENCOUNTER — Encounter: Payer: Self-pay | Admitting: Podiatry

## 2020-05-11 ENCOUNTER — Other Ambulatory Visit: Payer: Self-pay

## 2020-05-11 DIAGNOSIS — D689 Coagulation defect, unspecified: Secondary | ICD-10-CM

## 2020-05-11 DIAGNOSIS — L608 Other nail disorders: Secondary | ICD-10-CM

## 2020-05-11 DIAGNOSIS — B351 Tinea unguium: Secondary | ICD-10-CM | POA: Diagnosis not present

## 2020-05-11 DIAGNOSIS — M79676 Pain in unspecified toe(s): Secondary | ICD-10-CM

## 2020-05-11 NOTE — Progress Notes (Signed)
This patient returns to my office for at risk foot care.  This patient requires this care by a professional since this patient will be at risk due to having peripheral neuropathy and venous insufficiency and chronic kidney disease..This patient is unable to cut nails herself since the patient cannot reach her nails.These nails are painful walking and wearing shoes.  This patient presents for at risk foot care today.  General Appearance  Alert, conversant and in no acute stress.  Vascular  Dorsalis pedis and posterior tibial  pulses are palpable  bilaterally.  Capillary return is within normal limits  bilaterally. Temperature is within normal limits  bilaterally.  Neurologic  Senn-Weinstein monofilament wire test within normal limits  bilaterally. Muscle power within normal limits bilaterally.  Nails Thick disfigured discolored nails with subungual debris  from hallux to fifth toes bilaterally. No evidence of bacterial infection or drainage bilaterally. Pincer hallux nails  Orthopedic  No limitations of motion  feet .  No crepitus or effusions noted.  No bony pathology or digital deformities noted.  Hallux limitus 1st MPJ mright.  Skin  normotropic skin with no porokeratosis noted bilaterally.  No signs of infections or ulcers noted.     Onychomycosis  Pain in right toes  Pain in left toes  Consent was obtained for treatment procedures.   Mechanical debridement of nails 1-5  bilaterally performed with a nail nipper.  Filed with dremel without incident.    Return office visit    10 weeks                  Told patient to return for periodic foot care and evaluation due to potential at risk complications. Patient has nail spicule in lateral border right hallux.  She asked me not to work on removing the nail spicule..  I told her that if this nail becomes painful to return and I will remove this spicule under  local anesthsia if needed.   Gardiner Barefoot DPM

## 2020-05-22 ENCOUNTER — Ambulatory Visit (INDEPENDENT_AMBULATORY_CARE_PROVIDER_SITE_OTHER): Payer: Medicare Other | Admitting: Internal Medicine

## 2020-05-22 ENCOUNTER — Other Ambulatory Visit: Payer: Self-pay

## 2020-05-22 ENCOUNTER — Encounter: Payer: Self-pay | Admitting: Internal Medicine

## 2020-05-22 VITALS — BP 136/78 | HR 70 | Temp 98.2°F | Resp 14 | Ht 63.0 in | Wt 166.4 lb

## 2020-05-22 DIAGNOSIS — N1831 Chronic kidney disease, stage 3a: Secondary | ICD-10-CM

## 2020-05-22 DIAGNOSIS — E039 Hypothyroidism, unspecified: Secondary | ICD-10-CM

## 2020-05-22 DIAGNOSIS — E034 Atrophy of thyroid (acquired): Secondary | ICD-10-CM | POA: Diagnosis not present

## 2020-05-22 DIAGNOSIS — R7303 Prediabetes: Secondary | ICD-10-CM | POA: Diagnosis not present

## 2020-05-22 DIAGNOSIS — Z634 Disappearance and death of family member: Secondary | ICD-10-CM

## 2020-05-22 DIAGNOSIS — I480 Paroxysmal atrial fibrillation: Secondary | ICD-10-CM

## 2020-05-22 DIAGNOSIS — F4321 Adjustment disorder with depressed mood: Secondary | ICD-10-CM

## 2020-05-22 DIAGNOSIS — D6869 Other thrombophilia: Secondary | ICD-10-CM

## 2020-05-22 DIAGNOSIS — I10 Essential (primary) hypertension: Secondary | ICD-10-CM | POA: Diagnosis not present

## 2020-05-22 DIAGNOSIS — Z7901 Long term (current) use of anticoagulants: Secondary | ICD-10-CM

## 2020-05-22 DIAGNOSIS — Z1231 Encounter for screening mammogram for malignant neoplasm of breast: Secondary | ICD-10-CM | POA: Diagnosis not present

## 2020-05-22 DIAGNOSIS — E782 Mixed hyperlipidemia: Secondary | ICD-10-CM

## 2020-05-22 LAB — CBC WITH DIFFERENTIAL/PLATELET
Basophils Absolute: 0 10*3/uL (ref 0.0–0.1)
Basophils Relative: 0.2 % (ref 0.0–3.0)
Eosinophils Absolute: 0.2 10*3/uL (ref 0.0–0.7)
Eosinophils Relative: 2.7 % (ref 0.0–5.0)
HCT: 36.7 % (ref 36.0–46.0)
Hemoglobin: 12.1 g/dL (ref 12.0–15.0)
Lymphocytes Relative: 23.1 % (ref 12.0–46.0)
Lymphs Abs: 1.3 10*3/uL (ref 0.7–4.0)
MCHC: 33 g/dL (ref 30.0–36.0)
MCV: 93.8 fl (ref 78.0–100.0)
Monocytes Absolute: 0.5 10*3/uL (ref 0.1–1.0)
Monocytes Relative: 9 % (ref 3.0–12.0)
Neutro Abs: 3.7 10*3/uL (ref 1.4–7.7)
Neutrophils Relative %: 65 % (ref 43.0–77.0)
Platelets: 244 10*3/uL (ref 150.0–400.0)
RBC: 3.92 Mil/uL (ref 3.87–5.11)
RDW: 14.1 % (ref 11.5–15.5)
WBC: 5.7 10*3/uL (ref 4.0–10.5)

## 2020-05-22 LAB — LIPID PANEL
Cholesterol: 222 mg/dL — ABNORMAL HIGH (ref 0–200)
HDL: 70.5 mg/dL (ref >39.00)
LDL Cholesterol: 133 mg/dL — ABNORMAL HIGH (ref 0–99)
NonHDL: 151.87
Total CHOL/HDL Ratio: 3
Triglycerides: 93 mg/dL (ref 0.0–149.0)
VLDL: 18.6 mg/dL (ref 0.0–40.0)

## 2020-05-22 LAB — HEMOGLOBIN A1C: Hgb A1c MFr Bld: 5.7 % (ref 4.6–6.5)

## 2020-05-22 LAB — TSH: TSH: 0.53 u[IU]/mL (ref 0.35–4.50)

## 2020-05-22 NOTE — Patient Instructions (Addendum)
Your annual mammogram has been ordered.  You are encouraged (required) to call to make your appointment at [facility]  I have NOT ordered your coumadin level since your cardiologist does this   Hoping you have a peaceful and  blessed Christmas season,    Dr. Derrel Nip

## 2020-05-22 NOTE — Progress Notes (Signed)
Subjective:  Patient ID: Ana Cruz, female    DOB: 21-Sep-1933  Age: 86 y.o. MRN: [id number]  CC: The primary encounter diagnosis was Breast cancer screening by mammogram. Diagnoses of Mixed hyperlipidemia, Primary hypertension, Stage 3a chronic kidney disease ([place]), Hypothyroidism due to acquired atrophy of thyroid, Prediabetes, Encounter for current long-term use of anticoagulants, Encounter for screening mammogram for malignant neoplasm of breast, Paroxysmal atrial fibrillation ([place]), Acquired thrombophilia ([place]), Acquired hypothyroidism, and Grief at loss of child were also pertinent to this visit.  HPI CAILI ESCALERA presents for 6 month  Follow up on prediabetes,   Paroxysmal atrial fibrillation  And lumbar spinal stenosis     Acquired thrombophilia:  She is using  Warfarin for  embolic stroke risk mitigation due to  atrial fibrillation. Patient has no signs of bleeding or excessive bruising.  Peripheral Neuropathy :  Over time she had  increased the Pamelor to 3 tablets  But found that the higher dose caused  urinary incontinence.   Finally suspended the medication in late October and her left foot pain has resolved but still having neuropathy (feet /legs feel like they are wrapped in cardboard).  The incontinence has resolved. Does not want to try gabapentin due to husband George's adverse experience with it. Was able to enjoy a recent trip to [place] to see the symphony but has to get up every 2 hours to relieve jumpy legs.   Grieving son's death Remo Lipps) .  Remo Lipps was an alcoholic,  And his wife is also one, and has been upsetting patient with her odd conversations ("What does it feel like to lose a son/" so she has had to distance herself from the widow. She has been receiving grief counselling from the priest at [facility]   Hypertension: patient checks blood pressure twice weekly at home.  Readings have been for the most part < 140/80 at rest . Patient is following a  reduce salt diet most days and is taking medications as prescribed (telmisartan 80 mg and diltiazem CD 30 mg bid )    Outpatient Medications Prior to Visit  Medication Sig Dispense Refill   acetaminophen (TYLENOL) 500 MG tablet Take 500 mg by mouth every 6 (six) hours as needed (As needed for pain).     amoxicillin (AMOXIL) 500 MG capsule Take 500 mg by mouth. Take four tablets prior to dental work.     Artificial Tear Ointment (EYE LUBRICANT OP) Apply 1 drop to eye 2 (two) times daily.     Cholecalciferol (VITAMIN D3) 1000 UNITS CAPS Take 1,000 Units by mouth daily.     diltiazem (CARDIZEM) 30 MG tablet Take 30 mg by mouth 2 (two) times daily as needed.     levothyroxine (SYNTHROID) 88 MCG tablet TAKE 1 TABLET (88 MCG TOTAL) BY MOUTH DAILY BEFORE BREAKFAST. 90 tablet 1   Probiotic Product (ACIDOPHILUS/GOAT MILK) CAPS Take by mouth.     telmisartan (MICARDIS) 80 MG tablet Take 80 mg by mouth daily.     vitamin B-12 (CYANOCOBALAMIN) 1000 MCG tablet Take 1 tablet (1,000 mcg total) by mouth daily. 90 tablet 3   warfarin (COUMADIN) 1 MG tablet Take 1 mg by mouth daily.      pravastatin (PRAVACHOL) 20 MG tablet TAKE 1 TABLET BY MOUTH EVERY DAY 90 tablet 3   nortriptyline (PAMELOR) 10 MG capsule Take 2 capsules (20 mg total) by mouth at bedtime. (Patient not taking: Reported on 05/22/2020) 180 capsule 1   No facility-administered medications prior  to visit.    Review of Systems;  Patient denies headache, fevers, malaise, unintentional weight loss, skin rash, eye pain, sinus congestion and sinus pain, sore throat, dysphagia,  hemoptysis , cough, dyspnea, wheezing, chest pain, palpitations, orthopnea, edema, abdominal pain, nausea, melena, diarrhea, constipation, flank pain, dysuria, hematuria, urinary  Frequency, nocturia, numbness, tingling, seizures,  Focal weakness, Loss of consciousness,  Tremor, insomnia, depression, anxiety, and suicidal ideation.      Objective:  BP 136/78 (BP  Location: Left Arm, Patient Position: Sitting, Cuff Size: Large)    Pulse 70    Temp 98.2 F (36.8 C) (Oral)    Resp 14    Ht 5\' 3"  (1.6 m)    Wt 166 lb 6.4 oz (75.5 kg)    SpO2 95%    BMI 29.48 kg/m   BP Readings from Last 3 Encounters:  05/22/20 136/78  11/18/19 128/66  10/19/19 140/72    Wt Readings from Last 3 Encounters:  05/22/20 166 lb 6.4 oz (75.5 kg)  11/18/19 169 lb 3.2 oz (76.7 kg)  11/17/19 171 lb (77.6 kg)    General appearance: alert, cooperative and appears stated age Ears: normal TM's and external ear canals both ears Throat: lips, mucosa, and tongue normal; teeth and gums normal Neck: no adenopathy, no carotid bruit, supple, symmetrical, trachea midline and thyroid not enlarged, symmetric, no tenderness/mass/nodules Back: symmetric, no curvature. ROM normal. No CVA tenderness. Lungs: clear to auscultation bilaterally Heart: regular rate and rhythm, S1, S2 normal, no murmur, click, rub or gallop Abdomen: soft, non-tender; bowel sounds normal; no masses,  no organomegaly Pulses: 2+ and symmetric Skin: Skin color, texture, turgor normal. No rashes or lesions Lymph nodes: Cervical, supraclavicular, and axillary nodes normal. Psych:  Affect and mood are sad. , makes good eye contact. No fidgeting,   Denies suicidal thoughts    Lab Results  Component Value Date   HGBA1C 5.7 05/22/2020   HGBA1C 5.8 01/18/2020   HGBA1C 5.8 01/05/2019    Lab Results  Component Value Date   CREATININE 1.26 (H) 01/18/2020   CREATININE 1.2 (A) 07/26/2019   CREATININE 1.08 01/05/2019    Lab Results  Component Value Date   WBC 5.7 05/22/2020   HGB 12.1 05/22/2020   HCT 36.7 05/22/2020   PLT 244.0 05/22/2020   GLUCOSE 101 (H) 01/18/2020   CHOL 222 (H) 05/22/2020   TRIG 93.0 05/22/2020   HDL 70.50 05/22/2020   LDLDIRECT 107.0 03/14/2017   LDLCALC 133 (H) 05/22/2020   ALT 10 01/18/2020   AST 16 01/18/2020   NA 137 01/18/2020   K 4.6 01/18/2020   CL 103 01/18/2020    CREATININE 1.26 (H) 01/18/2020   BUN 41 (H) 01/18/2020   CO2 27 01/18/2020   TSH 0.53 05/22/2020   HGBA1C 5.7 05/22/2020    MM 3D SCREEN BREAST BILATERAL  Result Date: 05/06/2019 CLINICAL DATA:  Screening. EXAM: DIGITAL SCREENING BILATERAL MAMMOGRAM WITH TOMO AND CAD COMPARISON:  Previous exam(s). ACR Breast Density Category b: There are scattered areas of fibroglandular density. FINDINGS: There are no findings suspicious for malignancy. Images were processed with CAD. IMPRESSION: No mammographic evidence of malignancy. A result letter of this screening mammogram will be mailed directly to the patient. RECOMMENDATION: Screening mammogram in one year. (Code:SM-B-01Y) BI-RADS CATEGORY  1: Negative. Electronically Signed   By: Dorise Bullion III M.D   On: 05/06/2019 17:54    Assessment & Plan:   Problem List Items Addressed This Visit      Unprioritized  Breast cancer screening by mammogram - Primary    Mammogram has been ordered       Relevant Orders   MM 3D SCREEN BREAST BILATERAL   CKD (chronic kidney disease) stage 3, GFR 30-59 ml/min (HCC)    Secondary to hypertension.  She is on an ARB for control of hypertension,, and statin for control of hyperlipidemia. GFR is stable per review of Feb 2021 labs done by Dr Candiss Norse available via Epic portal  . PTH is pending   Lab Results  Component Value Date   CREATININE 1.26 (H) 01/18/2020         Relevant Orders   PTH, Intact and Calcium   Hypertension    Well controlled for age on current regimen of telmisartan 80 mg daily and diltiazem CD 30 mg bid  Renal function stable, no changes today.      Relevant Medications   pravastatin (PRAVACHOL) 40 MG tablet   Paroxysmal atrial fibrillation (HCC)    She appears to be in sinus rhythm, rate controlled.  Continue diltiazem and Eliquis      Relevant Medications   pravastatin (PRAVACHOL) 40 MG tablet   Hyperlipidemia    Managed with pravastatin . Goal LDL is < 100.  Will increase dose  with next refill to 40 mg daily   Lab Results  Component Value Date   CHOL 222 (H) 05/22/2020   HDL 70.50 05/22/2020   LDLCALC 133 (H) 05/22/2020   LDLDIRECT 107.0 03/14/2017   TRIG 93.0 05/22/2020   CHOLHDL 3 05/22/2020        Relevant Medications   pravastatin (PRAVACHOL) 40 MG tablet   Other Relevant Orders   Lipid panel (Completed)   Acquired hypothyroidism    Thyroid function is WNL on current dose.  No current changes needed.       Prediabetes   Relevant Orders   Hemoglobin A1c (Completed)   Acquired thrombophilia (HCC)    Continue use of Eliquis for embolic stroke risk mitigation due to paroxysmal atrial fibrillation. Patient has no signs of bleeding and is advised to notify her specialists prior to any procedure that may required suspension of Eliquis   Lab Results  Component Value Date   WBC 5.7 05/22/2020   HGB 12.1 05/22/2020   HCT 36.7 05/22/2020   MCV 93.8 05/22/2020   PLT 244.0 05/22/2020         Grief at loss of child    Patient is dealing with the unexpected loss of her son Annie Main and has adequate coping skills and emotional support .  i have asked patinet to return in one month to examine for signs of unresolving grief.        Other Visit Diagnoses    Encounter for current long-term use of anticoagulants       Relevant Orders   CBC with Differential/Platelet (Completed)      I have discontinued Marcie Bal L. Pewitt's nortriptyline. I have also changed her pravastatin. Additionally, I am having her maintain her Vitamin D3, amoxicillin, warfarin, acetaminophen, Artificial Tear Ointment (EYE LUBRICANT OP), vitamin B-12, diltiazem, Acidophilus/Goat Milk, levothyroxine, and telmisartan.  Meds ordered this encounter  Medications   pravastatin (PRAVACHOL) 40 MG tablet    Sig: Take 1 tablet (40 mg total) by mouth daily.    Dispense:  90 tablet    Refill:  1    NOTE  DOSE INCREASE KEEP ON FILE FOR FUTURE REFILLS    Medications Discontinued  During This Encounter  Medication Reason  nortriptyline (PAMELOR) 10 MG capsule    pravastatin (PRAVACHOL) 20 MG tablet    I provided  30 minutes of  face-to-face time during this encounter reviewing patient's current problems and past surgeries, labs and imaging studies, providing counseling on the above mentioned problems , and coordination  of care .  Follow-up: Return in about 6 months (around 11/20/2020).   Crecencio Mc, MD

## 2020-05-24 DIAGNOSIS — Z634 Disappearance and death of family member: Secondary | ICD-10-CM | POA: Insufficient documentation

## 2020-05-24 DIAGNOSIS — D6869 Other thrombophilia: Secondary | ICD-10-CM | POA: Insufficient documentation

## 2020-05-24 DIAGNOSIS — F4321 Adjustment disorder with depressed mood: Secondary | ICD-10-CM | POA: Insufficient documentation

## 2020-05-24 LAB — PTH, INTACT AND CALCIUM
Calcium: 9.3 mg/dL (ref 8.6–10.4)
PTH: 52 pg/mL (ref 14–64)

## 2020-05-24 MED ORDER — PRAVASTATIN SODIUM 40 MG PO TABS: 40.0000 mg | ORAL_TABLET | Freq: Every day | ORAL | 1 refills | Status: DC

## 2020-05-24 NOTE — Assessment & Plan Note (Signed)
Mammogram has been ordered

## 2020-05-24 NOTE — Assessment & Plan Note (Addendum)
Continue use of Eliquis for embolic stroke risk mitigation due to paroxysmal atrial fibrillation. Patient has no signs of bleeding and is advised to notify her specialists prior to any procedure that may required suspension of Eliquis   Lab Results  Component Value Date   WBC 5.7 05/22/2020   HGB 12.1 05/22/2020   HCT 36.7 05/22/2020   MCV 93.8 05/22/2020   PLT 244.0 05/22/2020

## 2020-05-24 NOTE — Assessment & Plan Note (Signed)
She appears to be in sinus rhythm, rate controlled.  Continue diltiazem and Eliquis

## 2020-05-24 NOTE — Progress Notes (Signed)
Ms Bezanson,  Your cholesterol is not at goal on your current dose of pravastatin.  Please increase your dose to 40 mg daily.  A new prescription has been put on file at [facility] for when you run out.    Your other labs are normal. Continue your other medications.   May the Lord give you peace and joy during this holiday season, and may the promise of His return bring you comfort and hope for the future.  Regards,   Deborra Medina, MD

## 2020-05-24 NOTE — Assessment & Plan Note (Signed)
Patient is dealing with the unexpected loss of her son Ana Cruz and has adequate coping skills and emotional support .  i have asked patinet to return in one month to examine for signs of unresolving grief.

## 2020-05-24 NOTE — Assessment & Plan Note (Signed)
Well controlled for age on current regimen of telmisartan 80 mg daily and diltiazem CD 30 mg bid  Renal function stable, no changes today.

## 2020-05-24 NOTE — Assessment & Plan Note (Signed)
Managed with pravastatin . Goal LDL is < 100.  Will increase dose with next refill to 40 mg daily   Lab Results  Component Value Date   CHOL 222 (H) 05/22/2020   HDL 70.50 05/22/2020   LDLCALC 133 (H) 05/22/2020   LDLDIRECT 107.0 03/14/2017   TRIG 93.0 05/22/2020   CHOLHDL 3 05/22/2020

## 2020-05-24 NOTE — Assessment & Plan Note (Signed)
Secondary to hypertension.  She is on an ARB for control of hypertension,, and statin for control of hyperlipidemia. GFR is stable per review of Feb 2021 labs done by Dr Candiss Norse available via Epic portal  . PTH is pending   Lab Results  Component Value Date   CREATININE 1.26 (H) 01/18/2020

## 2020-05-24 NOTE — Assessment & Plan Note (Signed)
Thyroid function is WNL on current dose.  No current changes needed.  

## 2020-05-29 ENCOUNTER — Telehealth: Payer: Self-pay | Admitting: Internal Medicine

## 2020-06-10 ENCOUNTER — Other Ambulatory Visit: Payer: Self-pay | Admitting: Internal Medicine

## 2020-06-10 DIAGNOSIS — E039 Hypothyroidism, unspecified: Secondary | ICD-10-CM

## 2020-06-12 ENCOUNTER — Encounter: Payer: Self-pay | Admitting: Emergency Medicine

## 2020-06-12 ENCOUNTER — Emergency Department
Admission: EM | Admit: 2020-06-12 | Discharge: 2020-06-12 | Disposition: A | Discharge status: Home / Self Care | Payer: Medicare Other | Attending: Emergency Medicine | Admitting: Emergency Medicine

## 2020-06-12 ENCOUNTER — Other Ambulatory Visit: Payer: Self-pay

## 2020-06-12 ENCOUNTER — Emergency Department: Payer: Medicare Other

## 2020-06-12 DIAGNOSIS — E039 Hypothyroidism, unspecified: Secondary | ICD-10-CM | POA: Diagnosis not present

## 2020-06-12 DIAGNOSIS — Z85038 Personal history of other malignant neoplasm of large intestine: Secondary | ICD-10-CM | POA: Insufficient documentation

## 2020-06-12 DIAGNOSIS — N183 Chronic kidney disease, stage 3 unspecified: Secondary | ICD-10-CM | POA: Insufficient documentation

## 2020-06-12 DIAGNOSIS — Z79899 Other long term (current) drug therapy: Secondary | ICD-10-CM | POA: Insufficient documentation

## 2020-06-12 DIAGNOSIS — I129 Hypertensive chronic kidney disease with stage 1 through stage 4 chronic kidney disease, or unspecified chronic kidney disease: Secondary | ICD-10-CM | POA: Diagnosis not present

## 2020-06-12 DIAGNOSIS — Z96653 Presence of artificial knee joint, bilateral: Secondary | ICD-10-CM | POA: Insufficient documentation

## 2020-06-12 DIAGNOSIS — J320 Chronic maxillary sinusitis: Secondary | ICD-10-CM | POA: Diagnosis not present

## 2020-06-12 DIAGNOSIS — Z7901 Long term (current) use of anticoagulants: Secondary | ICD-10-CM | POA: Diagnosis not present

## 2020-06-12 DIAGNOSIS — Z853 Personal history of malignant neoplasm of breast: Secondary | ICD-10-CM | POA: Diagnosis not present

## 2020-06-12 DIAGNOSIS — R42 Dizziness and giddiness: Secondary | ICD-10-CM | POA: Diagnosis present

## 2020-06-12 DIAGNOSIS — I10 Essential (primary) hypertension: Secondary | ICD-10-CM

## 2020-06-12 LAB — BASIC METABOLIC PANEL
Anion gap: 11 (ref 5–15)
BUN: 37 mg/dL — ABNORMAL HIGH (ref 8–23)
CO2: 24 mmol/L (ref 22–32)
Calcium: 9.3 mg/dL (ref 8.9–10.3)
Chloride: 105 mmol/L (ref 98–111)
Creatinine, Ser: 1.14 mg/dL — ABNORMAL HIGH (ref 0.44–1.00)
GFR, Estimated: 47 mL/min — ABNORMAL LOW (ref >60)
Glucose, Bld: 106 mg/dL — ABNORMAL HIGH (ref 70–99)
Potassium: 4.3 mmol/L (ref 3.5–5.1)
Sodium: 140 mmol/L (ref 135–145)

## 2020-06-12 LAB — CBC
HCT: 37.3 % (ref 36.0–46.0)
Hemoglobin: 12.1 g/dL (ref 12.0–15.0)
MCH: 30.9 pg (ref 26.0–34.0)
MCHC: 32.4 g/dL (ref 30.0–36.0)
MCV: 95.4 fL (ref 80.0–100.0)
Platelets: 258 10*3/uL (ref 150–400)
RBC: 3.91 MIL/uL (ref 3.87–5.11)
RDW: 14.6 % (ref 11.5–15.5)
WBC: 7.5 10*3/uL (ref 4.0–10.5)
nRBC: 0 % (ref 0.0–0.2)

## 2020-06-12 MED ORDER — CLONIDINE HCL 0.1 MG PO TABS
0.0500 mg | ORAL_TABLET | Freq: Once | ORAL | Status: AC
  Administered 2020-06-12: 0.05 mg via ORAL
  Filled 2020-06-12: qty 1

## 2020-06-12 MED ORDER — FLUTICASONE PROPIONATE 50 MCG/ACT NA SUSP: 1.0000 | Freq: Every day | NASAL | 0 refills | Status: AC

## 2020-06-12 MED ORDER — AMLODIPINE BESYLATE 5 MG PO TABS: 5.0000 mg | ORAL_TABLET | Freq: Every day | ORAL | 3 refills | Status: DC

## 2020-06-12 NOTE — Telephone Encounter (Signed)
Called and spoke to Ana Cruz. Ana Cruz verbalized understanding and had no further questions. She states that she has been on amlodipine before and it caused dizziness but she will try it again due to symptoms. She states that if her blood pressure does not improve within 30 min, she will go to the ER for evaluation. Ana Cruz verbalized understanding over [employer] and sending in symptoms through mychart.

## 2020-06-12 NOTE — Discharge Instructions (Signed)
As we discussed, start taking the amlodipine prescribed by Dr. Derrel Nip.  You can take this with your other medications in the morning. This works slowly and should slowly improve your BP over the next few days.  For your dizziness, I suspect this could be related to sinus fluid and pressure in your ear. I'd recommend seeing an ENT to discuss. You can also start flonase, a steroid nasal spray, to help with draining your sinus.

## 2020-06-12 NOTE — ED Provider Notes (Signed)
[facility] Emergency Department Provider Note  ____________________________________________   Event Date/Time   First MD Initiated Contact with Patient 06/12/20 1954     (approximate)  I have reviewed the triage vital signs and the nursing notes.   HISTORY  Chief Complaint Hypertension    HPI Ana Cruz is a 87 y.o. female  With h/o HTN here with HTN, mild dizziness. Pt reports that for the past week, she's noticed her BP steadily increasing at home. She states she normally is in the 120-130s but has been in the [id number] sytolic, and up to [id number] on multiple occasions. Earlier today, she noted she was >180 so called her PCP who advised that she takes amlodipine. Her doctor told her to come to the ED if BP not improved in 30 min. Repeat remained elevated so she is here. She has no other major complaints. No HA, vision changes. No CP, SOB, palpitations, leg swelling. No flank pain. No eye pain. Pt does not stressors from her son passing away but this has been some time, and denies any other health changes. No OTC med use. She does report she has recurrent sinusitis and has an ongoing sinus issue on her left, which she sees ENT for. She also reports some intermittent dizziness for the past week or two, which is worse w/ movement of her head and seems to occur mostly at night. She feels a little off balance with the dizziness then this resolves.        Past Medical History:  Diagnosis Date  . allergic rhinitis   . Atrial fibrillation ([place])   . Chronic kidney disease, stage I    mild, did improve with cessation of NSAIDs  . Elbow fracture, left    repaired Jan 2000  . Hyperlipidemia   . Hypertension   . hypothyroidism    medicated since age 28, no prior surgery  . Spinal stenosis    has L5 disk herniation with nerve root displacement    Patient Active Problem List   Diagnosis Date Noted  . Acquired thrombophilia ([place]) 05/24/2020  . Grief at loss of  child 05/24/2020  . Prediabetes 08/13/2019  . Numbness and tingling in right hand 12/22/2018  . Arthralgia of both hands 12/22/2018  . Peripheral neuropathy 06/23/2018  . Chronic bilateral low back pain with right-sided sciatica 02/24/2018  . Vertigo 12/09/2017  . Lymphedema 12/16/2016  . Chronic venous insufficiency 12/16/2016  . History of food anaphylaxis 08/22/2015  . Chronic right shoulder pain 08/21/2015  . Bilateral sacral insufficiency fracture with routine healing 08/21/2015  . Spinal stenosis of lumbar region with radiculopathy 05/11/2014  . Obesity 08/02/2013  . Encounter for preventive health examination 08/02/2013  . Screening for colon cancer 01/19/2013  . Acquired hypothyroidism 01/19/2013  . Drug allergy, antibiotic 01/19/2013  . Hallux rigidus of right foot 01/07/2013  . History of cardiovascular disorder 12/16/2012  . Medicare annual wellness visit, subsequent 07/28/2012  . Calcific tendonitis of right shoulder 04/09/2012  . Cataract extraction status of eye 10/07/2011  . Osteopenia 05/22/2011  . Spinal stenosis of lumbar region 04/10/2011  . CKD (chronic kidney disease) stage 3, GFR 30-59 ml/min (HCC)   . Hypertension   . Paroxysmal atrial fibrillation (HCC)   . Hyperlipidemia   . Breast cancer screening by mammogram 04/09/2011    Past Surgical History:  Procedure Laterality Date  . ABDOMINAL HYSTERECTOMY  1979  . APPENDECTOMY  1969  . BILATERAL OOPHORECTOMY     squ[place]  .  BLADDER REPAIR  1980  . OVARIAN CYST REMOVAL  April 1969  . REPLACEMENT TOTAL KNEE BILATERAL  2009  . reverse shoulder surgery  04/05/2016  . ROTATOR CUFF REPAIR  Jan 2000  . ROTATOR CUFF REPAIR  2000    Prior to Admission medications   Medication Sig Start Date End Date Taking? Authorizing Provider  fluticasone (FLONASE) 50 MCG/ACT nasal spray Place 1 spray into both nostrils daily. 06/12/20 07/12/20 Yes Duffy Bruce, MD  acetaminophen (TYLENOL) 500 MG tablet Take 500 mg by  mouth every 6 (six) hours as needed (As needed for pain).    [provider]  amLODipine (NORVASC) 5 MG tablet Take 1 tablet (5 mg total) by mouth daily. 06/12/20   Crecencio Mc, MD  amoxicillin (AMOXIL) 500 MG capsule Take 500 mg by mouth. Take four tablets prior to dental work.    [provider]  Artificial Tear Ointment (EYE LUBRICANT OP) Apply 1 drop to eye 2 (two) times daily.    [provider]  Cholecalciferol (VITAMIN D3) 1000 UNITS CAPS Take 1,000 Units by mouth daily.    [provider]  diltiazem (CARDIZEM) 30 MG tablet Take 30 mg by mouth 2 (two) times daily as needed. 05/03/19   [provider]  levothyroxine (SYNTHROID) 88 MCG tablet TAKE 1 TABLET (88 MCG TOTAL) BY MOUTH DAILY BEFORE BREAKFAST. 06/12/20   Crecencio Mc, MD  pravastatin (PRAVACHOL) 40 MG tablet Take 1 tablet (40 mg total) by mouth daily. 05/24/20   Crecencio Mc, MD  Probiotic Product (ACIDOPHILUS/GOAT MILK) CAPS Take by mouth.    [provider]  telmisartan (MICARDIS) 80 MG tablet Take 80 mg by mouth daily. 01/12/20   [provider]  vitamin B-12 (CYANOCOBALAMIN) 1000 MCG tablet Take 1 tablet (1,000 mcg total) by mouth daily. 06/22/18   Crecencio Mc, MD  warfarin (COUMADIN) 1 MG tablet Take 1 mg by mouth daily.     [provider]    Allergies Pamelor [nortriptyline], Amlodipine, Chocolate flavor, Clindamycin/lincomycin, Onion, Other, Tape, and Hctz [hydrochlorothiazide]  Family History  Problem Relation Age of Onset  . Hypertension Mother   . Dementia Mother   . Heart disease Father 46       MI  . Cancer Paternal Grandfather 72       colon  . Breast cancer Neg Hx     Social History Social History   Tobacco Use  . Smoking status: Never Smoker  . Smokeless tobacco: Never Used  Vaping Use  . Vaping Use: Never used  Substance Use Topics  . Alcohol use: Not Currently  . Drug use: No    Review of Systems  Review of  Systems  Constitutional: Negative for chills and fever.  HENT: Negative for sore throat.   Respiratory: Negative for shortness of breath.   Cardiovascular: Negative for chest pain.  Gastrointestinal: Negative for abdominal pain.  Genitourinary: Negative for flank pain.  Musculoskeletal: Negative for neck pain.  Skin: Negative for rash and wound.  Allergic/Immunologic: Negative for immunocompromised state.  Neurological: Positive for dizziness. Negative for weakness and numbness.  Hematological: Does not bruise/bleed easily.     ____________________________________________  PHYSICAL EXAM:      VITAL SIGNS: ED Triage Vitals  Enc Vitals Group     BP 06/12/20 1628 (!) 205/61     Pulse Rate 06/12/20 1628 65     Resp 06/12/20 1628 16     Temp 06/12/20 1628 98.2 F (36.8 C)  Temp Source 06/12/20 1628 Oral     SpO2 06/12/20 1628 97 %     Weight 06/12/20 1629 163 lb (73.9 kg)     Height 06/12/20 1629 5\' 3"  (1.6 m)     Head Circumference --      Peak Flow --      Pain Score 06/12/20 1629 0     Pain Loc --      Pain Edu? --      Excl. in [place]? --      Physical Exam Vitals and nursing note reviewed.  Constitutional:      General: She is not in acute distress.    Appearance: She is well-developed.  HENT:     Head: Normocephalic and atraumatic.     Comments: Serous effusion left TM, greater than right. No erythema. No sinus redness/swelling. Moderate nasal mucosal congestion, worse on left. Eyes:     Conjunctiva/sclera: Conjunctivae normal.  Cardiovascular:     Rate and Rhythm: Normal rate and regular rhythm.     Heart sounds: Normal heart sounds. No murmur heard. No friction rub.  Pulmonary:     Effort: Pulmonary effort is normal. No respiratory distress.     Breath sounds: Normal breath sounds. No wheezing or rales.  Abdominal:     General: There is no distension.     Palpations: Abdomen is soft.     Tenderness: There is no abdominal tenderness.  Musculoskeletal:      Cervical back: Neck supple.  Skin:    General: Skin is warm.     Capillary Refill: Capillary refill takes less than 2 seconds.  Neurological:     Mental Status: She is alert and oriented to person, place, and time.     GCS: GCS eye subscore is 4. GCS verbal subscore is 5. GCS motor subscore is 6.     Cranial Nerves: Cranial nerves are intact.     Sensory: Sensation is intact.     Motor: Motor function is intact. No abnormal muscle tone.     Coordination: Coordination is intact.       ____________________________________________   LABS (all labs ordered are listed, but only abnormal results are displayed)  Labs Reviewed  BASIC METABOLIC PANEL - Abnormal; Notable for the following components:      Result Value   Glucose, Bld 106 (*)    BUN 37 (*)    Creatinine, Ser 1.14 (*)    GFR, Estimated 47 (*)    All other components within normal limits  CBC  URINALYSIS, COMPLETE (UACMP) WITH MICROSCOPIC  CBG MONITORING, ED    ____________________________________________  EKG: Normal sinus rhythm, VR 63. PR 160, QRS 76, QTc 411. No acute ST elevations or depressions. Nonspecific TW abnormality. ________________________________________  RADIOLOGY All imaging, including plain films, CT scans, and ultrasounds, independently reviewed by me, and interpretations confirmed via formal radiology reads.  ED MD interpretation:   CT Head: [facility]  Official radiology report(s): No results found.  ____________________________________________  PROCEDURES   Procedure(s) performed (including Critical Care):  Procedures  ____________________________________________  INITIAL IMPRESSION / MDM / [place] / ED COURSE  As part of my medical decision making, I reviewed the following data within the [place] notes reviewed and incorporated, Old chart reviewed, Notes from prior ED visits, and  Controlled Substance Database       *TIMMIE CALIX  was evaluated in Emergency Department on 06/12/2020 for the symptoms described in the history of present illness.  She was evaluated in the context of the global COVID-19 pandemic, which necessitated consideration that the patient might be at risk for infection with the SARS-CoV-2 virus that causes COVID-19. Institutional protocols and algorithms that pertain to the evaluation of patients at risk for COVID-19 are in a state of rapid change based on information released by regulatory bodies including the CDC and federal and state organizations. These policies and algorithms were followed during the patient's care in the ED.  Some ED evaluations and interventions may be delayed as a result of limited staffing during the pandemic.*     Medical Decision Making:  87 yo F here with largely asymptomatic HTN. EKG nonischemic. No bleed on CT. Renal function at baseline. No signs of HTN emergency. She has already been started on amlodipine by her PCP and repeat BP here is normal. Will have her continue this and f/u as outpt. Incidentally, pt c/o intermittent dizziness w/ head movement that is c/w peripheral vertigo. She has recurrent sinusitis and I suspect this could be related to ME issues. Will have her f/u with ENT. No signs of cerebellar CVA.  ____________________________________________  FINAL CLINICAL IMPRESSION(S) / ED DIAGNOSES  Final diagnoses:  Primary hypertension  Chronic maxillary sinusitis     MEDICATIONS GIVEN DURING THIS VISIT:  Medications  cloNIDine (CATAPRES) tablet 0.05 mg (0.05 mg Oral Given 06/12/20 2033)     ED Discharge Orders         Ordered    fluticasone (FLONASE) 50 MCG/ACT nasal spray  Daily        06/12/20 2114           Note:  This document was prepared using Dragon voice recognition software and may include unintentional dictation errors.   Duffy Bruce, MD 06/12/20 2157

## 2020-06-12 NOTE — ED Notes (Signed)
Pt states history of high blood pressure and takes medications at home. Pt states coming in with high blood pressure that she has noticed over the last week that has not been controlled with medications.  Pt denies pain.

## 2020-06-12 NOTE — ED Triage Notes (Signed)
Pt ambulatory to triage without difficulty with reports of BP "over 200."  States PCP called in an RX for amlodipine and told her if BP did not go down in 30 mins to come to ER.  Pt states she is a little lightheaded.

## 2020-06-12 NOTE — Telephone Encounter (Signed)
Pt called in to report blood pressure readings and dizziness. She states that she is having anxiety because her blood pressure is not typically that high. Current pulse is 54. Sent to [facility] to triage. NO avail appts today at the time of the call

## 2020-06-12 NOTE — Telephone Encounter (Signed)
Please first of all remind patient of the following (see below ,)   and then advise her that she will need to start taking amlodipine in addition to her telmisartan.  I  amlodipine 5 mg daily  I will send to the pharmacy.  That is the best I can do without seeing her ,  As I have no appointments available and no other choices for medications given her slow pulse and intolerance to hctz.  If her BP does not improve 30 minutes after dose  She will need to be seen and if no appts are available she will have to go to ER or urgent care     MyChart should not be used to send symptoms that may suggest an infection or a new or worsening medical problem. It should be used to ask a Non-urgent medical question or to convey information that I have  requested (ie. BP readings, Blood sugars readings, messages back to identify medications, or visit follow-up questions). It is not appropriate to request treatment  through MyChart messages. If the office cannot give you an appointment in an urgent slot, you are advised to schedule an E-Visit with a [facility] Provider or go to the nearest Urgent Care clinic.   Regards,   Deborra Medina, MD

## 2020-06-26 ENCOUNTER — Other Ambulatory Visit: Payer: Self-pay

## 2020-06-26 ENCOUNTER — Ambulatory Visit (INDEPENDENT_AMBULATORY_CARE_PROVIDER_SITE_OTHER): Payer: Medicare Other | Admitting: Internal Medicine

## 2020-06-26 ENCOUNTER — Encounter: Payer: Self-pay | Admitting: Internal Medicine

## 2020-06-26 DIAGNOSIS — I10 Essential (primary) hypertension: Secondary | ICD-10-CM

## 2020-06-26 DIAGNOSIS — J320 Chronic maxillary sinusitis: Secondary | ICD-10-CM | POA: Diagnosis not present

## 2020-06-26 MED ORDER — FUROSEMIDE 20 MG PO TABS: 20.0000 mg | ORAL_TABLET | Freq: Every day | ORAL | 0 refills | Status: DC

## 2020-06-26 NOTE — Patient Instructions (Addendum)
Continue your current medications at your current doses.  As long as your readings are < 150/90,  Do not change anything.    You may add 2.5 mg  More of amlodipine if your readings are higher than 150/90  You may use the furosemide AS NEEDED for swelling/fluid retention

## 2020-06-26 NOTE — Progress Notes (Signed)
Subjective:  Patient ID: Ana Cruz, female    DOB: 1934/03/12  Age: 87 y.o. MRN: [id number]  CC: Diagnoses of Maxillary sinusitis, chronic and Primary hypertension were pertinent to this visit.  HPI Ana Cruz presents for follow up on ER evaluation for hypertension  This visit occurred during the SARS-CoV-2 public health emergency.  Safety protocols were in place, including screening questions prior to the visit, additional usage of staff PPE, and extensive cleaning of exam room while observing appropriate contact time as indicated for disinfecting solutions.   Evaluated for elevated BP and headaache in ER on Jan 10. Diagnosed with chronic maxillary sinusitis based on exam,  but CT head was negative for sinus opacification  , and EKG was negative for  acute changes.  given fluticasone and set up to see  //ENT   Vaught for Thursday.  Left frontal sinus pain has improved but not resolved, and still having specks of blood in her nasal discharge.   No fevers.  She has a remote History of maxillary sinus perforation by tooth root,  Required oral surgery   Stress:  Her Son was taken to hospital last week with ischemic bowel due to acute mesenteric ischemia (embolic) from atrial fibrillation .  He is morbidly obese  Best friend  Died last week  Of  bilateral PE   HTN:  Taking telmisartan  80 mg     And amlodipine 5 mg daily since Jan 10  Does not feel dizzy .  Home readings since ER visit have been < 150/90   Switching to [facility] for l/t meds   Outpatient Medications Prior to Visit  Medication Sig Dispense Refill   acetaminophen (TYLENOL) 500 MG tablet Take 500 mg by mouth every 6 (six) hours as needed (As needed for pain).     amLODipine (NORVASC) 5 MG tablet Take 1 tablet (5 mg total) by mouth daily. 90 tablet 3   amoxicillin (AMOXIL) 500 MG capsule Take 500 mg by mouth. Take four tablets prior to dental work.     Artificial Tear Ointment (EYE LUBRICANT OP) Apply 1  drop to eye 2 (two) times daily.     Cholecalciferol (VITAMIN D3) 1000 UNITS CAPS Take 1,000 Units by mouth daily.     diltiazem (CARDIZEM) 30 MG tablet Take 30 mg by mouth 2 (two) times daily as needed.     fluticasone (FLONASE) 50 MCG/ACT nasal spray Place 1 spray into both nostrils daily. 16 g 0   levothyroxine (SYNTHROID) 88 MCG tablet TAKE 1 TABLET (88 MCG TOTAL) BY MOUTH DAILY BEFORE BREAKFAST. 90 tablet 1   pravastatin (PRAVACHOL) 40 MG tablet Take 1 tablet (40 mg total) by mouth daily. 90 tablet 1   Probiotic Product (ACIDOPHILUS/GOAT MILK) CAPS Take by mouth.     telmisartan (MICARDIS) 80 MG tablet Take 80 mg by mouth daily.     vitamin B-12 (CYANOCOBALAMIN) 1000 MCG tablet Take 1 tablet (1,000 mcg total) by mouth daily. 90 tablet 3   warfarin (COUMADIN) 1 MG tablet Take 1 mg by mouth daily.      No facility-administered medications prior to visit.    Review of Systems;  Patient denies headache, fevers, malaise, unintentional weight loss, skin rash, eye pain, sinus congestion and sinus pain, sore throat, dysphagia,  hemoptysis , cough, dyspnea, wheezing, chest pain, palpitations, orthopnea, edema, abdominal pain, nausea, melena, diarrhea, constipation, flank pain, dysuria, hematuria, urinary  Frequency, nocturia, numbness, tingling, seizures,  Focal weakness, Loss of consciousness,  Tremor,  insomnia, depression, anxiety, and suicidal ideation.      Objective:  BP (!) 148/74 (BP Location: Left Arm, Patient Position: Sitting, Cuff Size: Normal)    Pulse 60    Temp 97.9 F (36.6 C) (Oral)    Ht 5\' 3"  (1.6 m)    Wt 165 lb 9.6 oz (75.1 kg)    SpO2 96%    BMI 29.33 kg/m   BP Readings from Last 3 Encounters:  06/26/20 (!) 148/74  06/12/20 131/74  05/22/20 136/78    Wt Readings from Last 3 Encounters:  06/26/20 165 lb 9.6 oz (75.1 kg)  06/12/20 163 lb (73.9 kg)  05/22/20 166 lb 6.4 oz (75.5 kg)    General appearance: alert, cooperative and appears stated age Ears:  normal TM's and external ear canals both ears Throat: lips, mucosa, and tongue normal; teeth and gums normal Neck: no adenopathy, no carotid bruit, supple, symmetrical, trachea midline and thyroid not enlarged, symmetric, no tenderness/mass/nodules Back: symmetric, no curvature. ROM normal. No CVA tenderness. Lungs: clear to auscultation bilaterally Heart: regular rate and rhythm, S1, S2 normal, no murmur, click, rub or gallop Abdomen: soft, non-tender; bowel sounds normal; no masses,  no organomegaly Pulses: 2+ and symmetric Skin: Skin color, texture, turgor normal. No rashes or lesions Lymph nodes: Cervical, supraclavicular, and axillary nodes normal.  Lab Results  Component Value Date   HGBA1C 5.7 05/22/2020   HGBA1C 5.8 01/18/2020   HGBA1C 5.8 01/05/2019    Lab Results  Component Value Date   CREATININE 1.14 (H) 06/12/2020   CREATININE 1.26 (H) 01/18/2020   CREATININE 1.2 (A) 07/26/2019    Lab Results  Component Value Date   WBC 7.5 06/12/2020   HGB 12.1 06/12/2020   HCT 37.3 06/12/2020   PLT 258 06/12/2020   GLUCOSE 106 (H) 06/12/2020   CHOL 222 (H) 05/22/2020   TRIG 93.0 05/22/2020   HDL 70.50 05/22/2020   LDLDIRECT 107.0 03/14/2017   LDLCALC 133 (H) 05/22/2020   ALT 10 01/18/2020   AST 16 01/18/2020   NA 140 06/12/2020   K 4.3 06/12/2020   CL 105 06/12/2020   CREATININE 1.14 (H) 06/12/2020   BUN 37 (H) 06/12/2020   CO2 24 06/12/2020   TSH 0.53 05/22/2020   HGBA1C 5.7 05/22/2020    CT Head Wo Contrast  Result Date: 06/12/2020 CLINICAL DATA:  Hypertension.  Lightheadedness. EXAM: CT HEAD WITHOUT CONTRAST TECHNIQUE: Contiguous axial images were obtained from the base of the skull through the vertex without intravenous contrast. COMPARISON:  12/04/2017 FINDINGS: Brain: There is no mass, hemorrhage or extra-axial collection. The size and configuration of the ventricles and extra-axial CSF spaces are normal. The brain parenchyma is normal, without acute or chronic  infarction. Vascular: No abnormal hyperdensity of the major intracranial arteries or dural venous sinuses. No intracranial atherosclerosis. Skull: The visualized skull base, calvarium and extracranial soft tissues are normal. Sinuses/Orbits: No fluid levels or advanced mucosal thickening of the visualized paranasal sinuses. No mastoid or middle ear effusion. The orbits are normal. IMPRESSION: Normal head CT. Electronically Signed   By: Ulyses Jarred M.D.   On: 06/12/2020 20:54    Assessment & Plan:   Problem List Items Addressed This Visit      Unprioritized   Hypertension    Recent episode appears to have been brought on by emotional stressors.  Advised to continue current regimen and increase amlodipine by 2.5 mg for readings > 150/90      Relevant Medications   furosemide (LASIX)  20 MG tablet   Maxillary sinusitis, chronic    Reviewed CT scan..  In the absence of sinus opacification , fevers, and facial pain , I do not see a reason to start abx at this time.  Weill defer to ENT since appt is in 3 days          I am having Ana Cruz start on furosemide. I am also having her maintain her Vitamin D3, amoxicillin, warfarin, acetaminophen, Artificial Tear Ointment (EYE LUBRICANT OP), vitamin B-12, diltiazem, Acidophilus/Goat Milk, telmisartan, pravastatin, levothyroxine, amLODipine, and fluticasone.  Meds ordered this encounter  Medications   furosemide (LASIX) 20 MG tablet    Sig: Take 1 tablet (20 mg total) by mouth daily. As needed for fluid retention    Dispense:  30 tablet    Refill:  0   I spent 30 mintutes dedicated to the care of this patient on the date of this encounter to include pre-visit review of his medical history,  Face-to-face time with the patient , and post visit ordering of testing and therapeutics.  There are no discontinued medications.  Follow-up: No follow-ups on file.   Crecencio Mc, MD

## 2020-06-27 NOTE — Assessment & Plan Note (Signed)
Recent episode appears to have been brought on by emotional stressors.  Advised to continue current regimen and increase amlodipine by 2.5 mg for readings > 150/90

## 2020-06-27 NOTE — Assessment & Plan Note (Signed)
Reviewed CT scan..  In the absence of sinus opacification , fevers, and facial pain , I do not see a reason to start abx at this time.  Weill defer to ENT since appt is in 3 days

## 2020-06-29 ENCOUNTER — Other Ambulatory Visit: Payer: Self-pay

## 2020-06-29 DIAGNOSIS — E039 Hypothyroidism, unspecified: Secondary | ICD-10-CM

## 2020-06-29 DIAGNOSIS — E782 Mixed hyperlipidemia: Secondary | ICD-10-CM

## 2020-06-29 MED ORDER — PRAVASTATIN SODIUM 40 MG PO TABS: 40.0000 mg | ORAL_TABLET | Freq: Every day | ORAL | 1 refills | Status: DC

## 2020-06-29 MED ORDER — LEVOTHYROXINE SODIUM 88 MCG PO TABS: 88.0000 ug | ORAL_TABLET | Freq: Every day | ORAL | 1 refills | Status: DC

## 2020-07-06 ENCOUNTER — Other Ambulatory Visit: Payer: Self-pay

## 2020-07-06 ENCOUNTER — Ambulatory Visit
Admission: RE | Admit: 2020-07-06 | Discharge: 2020-07-06 | Disposition: A | Discharge status: Home / Self Care | Payer: Medicare Other | Source: Ambulatory Visit | Attending: Internal Medicine | Admitting: Internal Medicine

## 2020-07-06 DIAGNOSIS — Z1231 Encounter for screening mammogram for malignant neoplasm of breast: Secondary | ICD-10-CM | POA: Diagnosis not present

## 2020-07-11 ENCOUNTER — Other Ambulatory Visit: Payer: Self-pay | Admitting: Otolaryngology

## 2020-07-11 DIAGNOSIS — R42 Dizziness and giddiness: Secondary | ICD-10-CM

## 2020-07-19 ENCOUNTER — Other Ambulatory Visit: Payer: Self-pay | Admitting: Internal Medicine

## 2020-07-20 ENCOUNTER — Ambulatory Visit: Payer: Medicare Other | Admitting: Podiatry

## 2020-07-24 ENCOUNTER — Other Ambulatory Visit: Payer: Self-pay

## 2020-07-24 ENCOUNTER — Ambulatory Visit (INDEPENDENT_AMBULATORY_CARE_PROVIDER_SITE_OTHER): Payer: Medicare Other | Admitting: Podiatry

## 2020-07-24 ENCOUNTER — Encounter: Payer: Self-pay | Admitting: Podiatry

## 2020-07-24 DIAGNOSIS — B351 Tinea unguium: Secondary | ICD-10-CM | POA: Diagnosis not present

## 2020-07-24 DIAGNOSIS — L608 Other nail disorders: Secondary | ICD-10-CM | POA: Diagnosis not present

## 2020-07-24 DIAGNOSIS — D689 Coagulation defect, unspecified: Secondary | ICD-10-CM | POA: Diagnosis not present

## 2020-07-24 DIAGNOSIS — M79676 Pain in unspecified toe(s): Secondary | ICD-10-CM

## 2020-07-24 NOTE — Progress Notes (Signed)
This patient returns to my office for at risk foot care.  This patient requires this care by a professional since this patient will be at risk due to having peripheral neuropathy and venous insufficiency and chronic kidney disease..This patient is unable to cut nails herself since the patient cannot reach her nails.These nails are painful walking and wearing shoes.  This patient presents for at risk foot care today.  General Appearance  Alert, conversant and in no acute stress.  Vascular  Dorsalis pedis and posterior tibial  pulses are palpable  bilaterally.  Capillary return is within normal limits  bilaterally. Temperature is within normal limits  bilaterally.  Neurologic  Senn-Weinstein monofilament wire test within normal limits  bilaterally. Muscle power within normal limits bilaterally.  Nails Thick disfigured discolored nails with subungual debris  from hallux to fifth toes bilaterally. No evidence of bacterial infection or drainage bilaterally. Pincer hallux nails  Orthopedic  No limitations of motion  feet .  No crepitus or effusions noted.  No bony pathology or digital deformities noted.  Hallux limitus 1st MPJ mright.  Skin  normotropic skin with no porokeratosis noted bilaterally.  No signs of infections or ulcers noted.     Onychomycosis  Pain in right toes  Pain in left toes  Consent was obtained for treatment procedures.   Mechanical debridement of nails 1-5  bilaterally performed with a nail nipper.  Filed with dremel without incident.    Return office visit    10 weeks                  Told patient to return for periodic foot care and evaluation due to potential at risk complications.   Gardiner Barefoot DPM

## 2020-07-30 ENCOUNTER — Other Ambulatory Visit: Payer: Self-pay | Admitting: Internal Medicine

## 2020-08-02 ENCOUNTER — Ambulatory Visit
Admission: RE | Admit: 2020-08-02 | Discharge: 2020-08-02 | Disposition: A | Discharge status: Home / Self Care | Payer: Medicare Other | Source: Ambulatory Visit | Attending: Otolaryngology | Admitting: Otolaryngology

## 2020-08-02 ENCOUNTER — Other Ambulatory Visit: Payer: Self-pay

## 2020-08-02 DIAGNOSIS — R42 Dizziness and giddiness: Secondary | ICD-10-CM

## 2020-08-02 MED ORDER — GADOBENATE DIMEGLUMINE 529 MG/ML IV SOLN
15.0000 mL | Freq: Once | INTRAVENOUS | Status: AC | PRN
  Administered 2020-08-02: 15 mL via INTRAVENOUS

## 2020-11-17 ENCOUNTER — Ambulatory Visit (INDEPENDENT_AMBULATORY_CARE_PROVIDER_SITE_OTHER): Payer: Medicare Other

## 2020-11-17 ENCOUNTER — Other Ambulatory Visit: Payer: Self-pay

## 2020-11-17 VITALS — BP 136/76 | Temp 96.7°F | Resp 15 | Ht 63.0 in | Wt 166.4 lb

## 2020-11-17 DIAGNOSIS — Z0000 Encounter for general adult medical examination without abnormal findings: Secondary | ICD-10-CM | POA: Diagnosis not present

## 2020-11-17 NOTE — Patient Instructions (Addendum)
Ana Cruz , Thank you for taking time to come for your Medicare Wellness Visit. I appreciate your ongoing commitment to your health goals. Please review the following plan we discussed and let me know if I can assist you in the future.   These are the goals we discussed:  Goals       Patient Stated     Maintain Healthy Lifestyle (pt-stated)      Stay active Healthy diet         This is a list of the screening recommended for you and due dates:  Health Maintenance  Topic Date Due   Tetanus Vaccine  11/17/2021*   Flu Shot  01/01/2021   DEXA scan (bone density measurement)  Completed   COVID-19 Vaccine  Completed   Pneumonia vaccines  Completed   Zoster (Shingles) Vaccine  Completed   HPV Vaccine  Aged Out  *Topic was postponed. The date shown is not the original due date.    Advanced directives: on file  Conditions/risks identified: none new  Follow up in one year for your annual wellness visit    Preventive Care 65 Years and Older, Female Preventive care refers to lifestyle choices and visits with your health care provider that can promote health and wellness. What does preventive care include? A yearly physical exam. This is also called an annual well check. Dental exams once or twice a year. Routine eye exams. Ask your health care provider how often you should have your eyes checked. Personal lifestyle choices, including: Daily care of your teeth and gums. Regular physical activity. Eating a healthy diet. Avoiding tobacco and drug use. Limiting alcohol use. Practicing safe sex. Taking low-dose aspirin every day. Taking vitamin and mineral supplements as recommended by your health care provider. What happens during an annual well check? The services and screenings done by your health care provider during your annual well check will depend on your age, overall health, lifestyle risk factors, and family history of disease. Counseling  Your health care  provider may ask you questions about your: Alcohol use. Tobacco use. Drug use. Emotional well-being. Home and relationship well-being. Sexual activity. Eating habits. History of falls. Memory and ability to understand (cognition). Work and work Statistician. Reproductive health. Screening  You may have the following tests or measurements: Height, weight, and BMI. Blood pressure. Lipid and cholesterol levels. These may be checked every 5 years, or more frequently if you are over 53 years old. Skin check. Lung cancer screening. You may have this screening every year starting at age 41 if you have a 30-pack-year history of smoking and currently smoke or have quit within the past 15 years. Fecal occult blood test (FOBT) of the stool. You may have this test every year starting at age 65. Flexible sigmoidoscopy or colonoscopy. You may have a sigmoidoscopy every 5 years or a colonoscopy every 10 years starting at age 28. Hepatitis C blood test. Hepatitis B blood test. Sexually transmitted disease (STD) testing. Diabetes screening. This is done by checking your blood sugar (glucose) after you have not eaten for a while (fasting). You may have this done every 1-3 years. Bone density scan. This is done to screen for osteoporosis. You may have this done starting at age 9. Mammogram. This may be done every 1-2 years. Talk to your health care provider about how often you should have regular mammograms. Talk with your health care provider about your test results, treatment options, and if necessary, the need for more tests.  Vaccines  Your health care provider may recommend certain vaccines, such as: Influenza vaccine. This is recommended every year. Tetanus, diphtheria, and acellular pertussis (Tdap, Td) vaccine. You may need a Td booster every 10 years. Zoster vaccine. You may need this after age 45. Pneumococcal 13-valent conjugate (PCV13) vaccine. One dose is recommended after age  45. Pneumococcal polysaccharide (PPSV23) vaccine. One dose is recommended after age 89. Talk to your health care provider about which screenings and vaccines you need and how often you need them. This information is not intended to replace advice given to you by your health care provider. Make sure you discuss any questions you have with your health care provider. Document Released: 06/16/2015 Document Revised: 02/07/2016 Document Reviewed: 03/21/2015 Elsevier Interactive Patient Education  2017 [place] Prevention in the Home Falls can cause injuries. They can happen to people of all ages. There are many things you can do to make your home safe and to help prevent falls. What can I do on the outside of my home? Regularly fix the edges of walkways and driveways and fix any cracks. Remove anything that might make you trip as you walk through a door, such as a raised step or threshold. Trim any bushes or trees on the path to your home. Use bright outdoor lighting. Clear any walking paths of anything that might make someone trip, such as rocks or tools. Regularly check to see if handrails are loose or broken. Make sure that both sides of any steps have handrails. Any raised decks and porches should have guardrails on the edges. Have any leaves, snow, or ice cleared regularly. Use sand or salt on walking paths during winter. Clean up any spills in your garage right away. This includes oil or grease spills. What can I do in the bathroom? Use night lights. Install grab bars by the toilet and in the tub and shower. Do not use towel bars as grab bars. Use non-skid mats or decals in the tub or shower. If you need to sit down in the shower, use a plastic, non-slip stool. Keep the floor dry. Clean up any water that spills on the floor as soon as it happens. Remove soap buildup in the tub or shower regularly. Attach bath mats securely with double-sided non-slip rug tape. Do not have throw  rugs and other things on the floor that can make you trip. What can I do in the bedroom? Use night lights. Make sure that you have a light by your bed that is easy to reach. Do not use any sheets or blankets that are too big for your bed. They should not hang down onto the floor. Have a firm chair that has side arms. You can use this for support while you get dressed. Do not have throw rugs and other things on the floor that can make you trip. What can I do in the kitchen? Clean up any spills right away. Avoid walking on wet floors. Keep items that you use a lot in easy-to-reach places. If you need to reach something above you, use a strong step stool that has a grab bar. Keep electrical cords out of the way. Do not use floor polish or wax that makes floors slippery. If you must use wax, use non-skid floor wax. Do not have throw rugs and other things on the floor that can make you trip. What can I do with my stairs? Do not leave any items on the stairs. Make sure that  there are handrails on both sides of the stairs and use them. Fix handrails that are broken or loose. Make sure that handrails are as long as the stairways. Check any carpeting to make sure that it is firmly attached to the stairs. Fix any carpet that is loose or worn. Avoid having throw rugs at the top or bottom of the stairs. If you do have throw rugs, attach them to the floor with carpet tape. Make sure that you have a light switch at the top of the stairs and the bottom of the stairs. If you do not have them, ask someone to add them for you. What else can I do to help prevent falls? Wear shoes that: Do not have high heels. Have rubber bottoms. Are comfortable and fit you well. Are closed at the toe. Do not wear sandals. If you use a stepladder: Make sure that it is fully opened. Do not climb a closed stepladder. Make sure that both sides of the stepladder are locked into place. Ask someone to hold it for you, if  possible. Clearly mark and make sure that you can see: Any grab bars or handrails. First and last steps. Where the edge of each step is. Use tools that help you move around (mobility aids) if they are needed. These include: Canes. Walkers. Scooters. Crutches. Turn on the lights when you go into a dark area. Replace any light bulbs as soon as they burn out. Set up your furniture so you have a clear path. Avoid moving your furniture around. If any of your floors are uneven, fix them. If there are any pets around you, be aware of where they are. Review your medicines with your doctor. Some medicines can make you feel dizzy. This can increase your chance of falling. Ask your doctor what other things that you can do to help prevent falls. This information is not intended to replace advice given to you by your health care provider. Make sure you discuss any questions you have with your health care provider. Document Released: 03/16/2009 Document Revised: 10/26/2015 Document Reviewed: 06/24/2014 Elsevier Interactive Patient Education  2017 [employer].

## 2020-11-17 NOTE — Progress Notes (Addendum)
Subjective:   Ana Cruz is a 87 y.o. female who presents for Medicare Annual (Subsequent) preventive examination.  Review of Systems    No ROS.  Medicare Wellness   Cardiac Risk Factors include: advanced age (>89men, >64 women)     Objective:    Today's Vitals   11/17/20 1123  BP: 136/76  Resp: 15  Temp: (!) 96.7 F (35.9 C)  SpO2: 97%  Weight: 166 lb 6.4 oz (75.5 kg)  Height: 5\' 3"  (1.6 m)   Body mass index is 29.48 kg/m.  Advanced Directives 11/17/2020 06/12/2020 11/17/2019 02/22/2019 11/16/2018 12/04/2017 09/11/2017  Does Patient Have a Medical Advance Directive? Yes No Yes Yes Yes No Yes  Type of Paramedic of [place];Living will - [place];Living will - [place];Living will - [place];Living will  Does patient want to make changes to medical advance directive? No - Patient declined - No - Patient declined - No - Patient declined - No - Patient declined  Copy of [place] in Chart? Yes - validated most recent copy scanned in chart (See row information) - No - copy requested - No - copy requested - No - copy requested  Would patient like information on creating a medical advance directive? - No - Patient declined - - - No - Patient declined -    Current Medications (verified) Outpatient Encounter Medications as of 11/17/2020  Medication Sig   acetaminophen (TYLENOL) 500 MG tablet Take 500 mg by mouth every 6 (six) hours as needed (As needed for pain).   amLODipine (NORVASC) 5 MG tablet Take 1 tablet (5 mg total) by mouth daily.   amoxicillin (AMOXIL) 500 MG capsule Take 500 mg by mouth. Take four tablets prior to dental work.   Artificial Tear Ointment (EYE LUBRICANT OP) Apply 1 drop to eye 2 (two) times daily.   Cholecalciferol (VITAMIN D3) 1000 UNITS CAPS Take 1,000 Units by mouth daily.   diazepam (VALIUM) 5 MG tablet SMARTSIG:1 Tablet(s) By Mouth   diltiazem  (CARDIZEM) 30 MG tablet Take 30 mg by mouth 2 (two) times daily as needed.   fluticasone (FLONASE) 50 MCG/ACT nasal spray Place 1 spray into both nostrils daily.   furosemide (LASIX) 20 MG tablet TAKE 1 TABLET (20 MG TOTAL) BY MOUTH DAILY. AS NEEDED FOR FLUID RETENTION   levothyroxine (SYNTHROID) 88 MCG tablet Take 1 tablet (88 mcg total) by mouth daily before breakfast.   pravastatin (PRAVACHOL) 40 MG tablet Take 1 tablet (40 mg total) by mouth daily.   Probiotic Product (ACIDOPHILUS/GOAT MILK) CAPS Take by mouth.   telmisartan (MICARDIS) 80 MG tablet Take 80 mg by mouth daily.   vitamin B-12 (CYANOCOBALAMIN) 1000 MCG tablet Take 1 tablet (1,000 mcg total) by mouth daily.   warfarin (COUMADIN) 1 MG tablet Take 1 mg by mouth daily.    No facility-administered encounter medications on file as of 11/17/2020.    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   Past Surgical History:  Procedure Laterality Date   ABDOMINAL HYSTERECTOMY  1979   APPENDECTOMY  1969   BILATERAL OOPHORECTOMY     squential   BLADDER REPAIR  1980   OVARIAN CYST REMOVAL  April 1969   REPLACEMENT TOTAL KNEE BILATERAL  2009   reverse shoulder surgery  04/05/2016   ROTATOR CUFF REPAIR  Jan 2000   ROTATOR CUFF REPAIR  2000   Family History  Problem Relation Age of Onset   Hypertension Mother    Dementia Mother    Heart disease Father 77       MI   Cancer Paternal Grandfather 31       colon   Breast cancer Neg Hx    Social  History   Socioeconomic History   Marital status: Married    Spouse name: Not on file   Number of children: 4   Years of education: 13.5   Highest education level: Not on file  Occupational History   Occupation: retired  Tobacco Use   Smoking status: Never   Smokeless tobacco: Never  Vaping Use   Vaping Use: Never used  Substance and Sexual Activity   Alcohol use: Not Currently   Drug use: No   Sexual activity: Not on file  Other Topics Concern   Not on file  Social History Narrative   Married    [place]    Right handed   One story   Social Determinants of Health   Financial Resource Strain: Low Risk    Difficulty of Paying Living Expenses: Not hard at all  Food Insecurity: No Food Insecurity   Worried About Charity fundraiser in the Last Year: Never true   [place] in the Last Year: Never true  Transportation Needs: No Transportation Needs   Lack of Transportation (Medical): No   Lack of Transportation (Non-Medical): No  Physical Activity: Sufficiently Active   Days of Exercise per Week: 3 days   Minutes of Exercise per Session: 60 min  Stress: No Stress Concern Present   Feeling of Stress : Not at all  Social Connections: Unknown   Frequency of Communication with Friends and Family: Not on file   Frequency of Social Gatherings with Friends and Family: More than three times a week   Attends Religious Services: Not on Electrical engineer or Organizations: Not on file   Attends Archivist Meetings: Not on file   Marital Status: Married    Tobacco Counseling Counseling given: Not Answered   Clinical Intake:  Pre-visit preparation completed: Yes        Diabetes: No  How often do you need to have someone help you when you read instructions, pamphlets, or other written materials from your doctor or pharmacy?: 1 - Never  Interpreter Needed?: No      Activities of Daily Living In your present state of health, do  you have any difficulty performing the following activities: 11/17/2020  Hearing? N  Vision? N  Difficulty concentrating or making decisions? N  Walking or climbing stairs? Y  Comment Unsteady gait. Cane in use when ambulating.  Dressing or bathing? N  Doing errands, shopping? N  Preparing Food and eating ? N  Using the Toilet? N  In the past six months, have you accidently leaked urine? N  Do you have problems with loss of bowel control? N  Managing your Medications?  N  Managing your Finances? N  Housekeeping or managing your Housekeeping? N  Some recent data might be hidden    Patient Care Team: Crecencio Mc, MD as PCP - General (Internal Medicine) Alda Berthold, DO as Consulting Physician (Neurology)  Indicate any recent Medical Services you may have received from other than [facility] providers in the past year (date may be approximate).     Assessment:   This is a routine wellness examination for Paxtyn.  Hearing/Vision screen Patient is able to hear conversational tones without difficulty.  No issues reported. Wears corrective lenses They have seen their ophthalmologist in the last 12 months.    Dietary issues and exercise activities discussed: Current Exercise Habits: Home exercise routine, Intensity: Mild Regular diet Good water intake   Goals Addressed               This Visit's Progress     Patient Stated     Maintain Healthy Lifestyle (pt-stated)        Stay active Healthy diet        Depression Screen PHQ 2/9 Scores 11/17/2020 11/17/2019 11/16/2018 01/30/2018 09/11/2017 09/10/2016 08/20/2016  PHQ - 2 Score 0 0 0 0 0 0 0    Fall Risk Fall Risk  11/17/2020 06/26/2020 05/22/2020 11/18/2019 10/19/2019  Falls in the past year? 0 0 0 0 0  Number falls in past yr: 0 - - - 0  Injury with Fall? 0 - - - 0  Risk for fall due to : - - - - Impaired balance/gait;Impaired mobility  Follow up Falls evaluation completed Falls evaluation completed Falls evaluation  completed Falls evaluation completed Falls evaluation completed    FALL RISK PREVENTION PERTAINING TO THE HOME: Handrails in use when climbing stairs? Yes Home free of loose throw rugs in walkways, pet beds, electrical cords, etc? Yes  Adequate lighting in your home to reduce risk of falls? Yes   ASSISTIVE DEVICES UTILIZED TO PREVENT FALLS:  Life alert? Yes  Use of a cane, walker or w/c? Yes , cane in use.  TIMED UP AND GO: Was the test performed? Yes .  Length of time to ambulate 10 feet: 15 sec.   Gait slow and steady with assistive device  Cognitive Function:  Patient is alert and oriented x3.  Denies difficulty focusing, making decisions,memory loss.  MMSE/6CIT deferred. Normal by direct communication/observation.    6CIT Screen 11/17/2019 11/16/2018 09/10/2016  What Year? 0 points 0 points 0 points  What month? 0 points 0 points 0 points  What time? - 0 points 0 points  Count back from 20 - 0 points 0 points  Months in reverse 0 points 0 points 0 points  Repeat phrase 0 points 0 points 0 points  Total Score - 0 0    Immunizations Immunization History  Administered Date(s) Administered   Influenza Split 03/31/2012, 04/01/2013, 02/22/2014   Influenza Whole 03/05/2011   Influenza, High Dose Seasonal PF 02/20/2016, 03/14/2017   Influenza,inj,Quad PF,6+ Mos 02/21/2015   Influenza-Unspecified 03/19/2018, 03/18/2019, 03/02/2020   Moderna Sars-Covid-2 Vaccination 06/18/2019, 07/16/2019, 04/18/2020, 10/19/2020   Pneumococcal Conjugate-13 08/02/2013   Pneumococcal Polysaccharide-23 04/09/1999, 04/09/2011   Tdap 09/19/2008   Zoster Recombinat (Shingrix) 02/15/2019, 03/17/2019   Zoster, Live 01/20/2007    TDAP status: Due, Education has been provided regarding the importance of this vaccine. Advised may receive this vaccine at local pharmacy or Health Dept. Aware to provide a copy of the vaccination record if obtained from local pharmacy or Health  Dept. Verbalized acceptance  and understanding. Deferred.   Health Maintenance Health Maintenance  Topic Date Due   TETANUS/TDAP  11/17/2021 (Originally 09/20/2018)   INFLUENZA VACCINE  01/01/2021   DEXA SCAN  Completed   COVID-19 Vaccine  Completed   PNA vac Low Risk Adult  Completed   Zoster Vaccines- Shingrix  Completed   HPV VACCINES  Aged Out    Colorectal cancer screening: No longer required.   Mammogram status: Completed 07/2020. Repeat every year  Lung Cancer Screening: (Low Dose CT Chest recommended if Age 58-80 years, 30 pack-year currently smoking OR have quit w/in 15years.) does not qualify.   Vision Screening: Recommended annual ophthalmology exams for early detection of glaucoma and other disorders of the eye.  Dental Screening: Recommended annual dental exams for proper oral hygiene.  Community Resource Referral / Chronic Care Management: CRR required this visit?  No   CCM required this visit?  No      Plan:   Keep all routine maintenance appointments.   I have personally reviewed and noted the following in the patient's chart:   Medical and social history Use of alcohol, tobacco or illicit drugs  Current medications and supplements including opioid prescriptions. Patient is not currently taking opioids.  Functional ability and status Nutritional status Physical activity Advanced directives List of other physicians Hospitalizations, surgeries, and ER visits in previous 12 months Vitals Screenings to include cognitive, depression, and falls Referrals and appointments  In addition, I have reviewed and discussed with patient certain preventive protocols, quality metrics, and best practice recommendations. A written personalized care plan for preventive services as well as general preventive health recommendations were provided to patient.     OBrien-Blaney, Domonique Cothran L, LPN   5/00/9381     I have reviewed the above information and agree with above.   Deborra Medina, MD

## 2020-11-20 ENCOUNTER — Encounter: Payer: Self-pay | Admitting: Internal Medicine

## 2020-11-20 ENCOUNTER — Ambulatory Visit (INDEPENDENT_AMBULATORY_CARE_PROVIDER_SITE_OTHER): Payer: Medicare Other | Admitting: Internal Medicine

## 2020-11-20 ENCOUNTER — Other Ambulatory Visit: Payer: Self-pay

## 2020-11-20 VITALS — BP 146/56 | HR 56 | Temp 97.7°F | Ht 63.0 in | Wt 166.8 lb

## 2020-11-20 DIAGNOSIS — M8580 Other specified disorders of bone density and structure, unspecified site: Secondary | ICD-10-CM

## 2020-11-20 DIAGNOSIS — Z23 Encounter for immunization: Secondary | ICD-10-CM

## 2020-11-20 DIAGNOSIS — M5416 Radiculopathy, lumbar region: Secondary | ICD-10-CM

## 2020-11-20 DIAGNOSIS — R42 Dizziness and giddiness: Secondary | ICD-10-CM

## 2020-11-20 DIAGNOSIS — E2839 Other primary ovarian failure: Secondary | ICD-10-CM | POA: Diagnosis not present

## 2020-11-20 DIAGNOSIS — G6289 Other specified polyneuropathies: Secondary | ICD-10-CM

## 2020-11-20 DIAGNOSIS — E782 Mixed hyperlipidemia: Secondary | ICD-10-CM | POA: Diagnosis not present

## 2020-11-20 DIAGNOSIS — R7303 Prediabetes: Secondary | ICD-10-CM

## 2020-11-20 DIAGNOSIS — E039 Hypothyroidism, unspecified: Secondary | ICD-10-CM | POA: Diagnosis not present

## 2020-11-20 DIAGNOSIS — I69398 Other sequelae of cerebral infarction: Secondary | ICD-10-CM

## 2020-11-20 DIAGNOSIS — M48061 Spinal stenosis, lumbar region without neurogenic claudication: Secondary | ICD-10-CM

## 2020-11-20 LAB — COMPREHENSIVE METABOLIC PANEL
ALT: 11 U/L (ref 0–35)
AST: 16 U/L (ref 0–37)
Albumin: 4.1 g/dL (ref 3.5–5.2)
Alkaline Phosphatase: 84 U/L (ref 39–117)
BUN: 44 mg/dL — ABNORMAL HIGH (ref 6–23)
CO2: 26 mEq/L (ref 19–32)
Calcium: 9.6 mg/dL (ref 8.4–10.5)
Chloride: 106 mEq/L (ref 96–112)
Creatinine, Ser: 1.19 mg/dL (ref 0.40–1.20)
GFR: 41.74 mL/min — ABNORMAL LOW (ref >60.00)
Glucose, Bld: 89 mg/dL (ref 70–99)
Potassium: 4.9 mEq/L (ref 3.5–5.1)
Sodium: 140 mEq/L (ref 135–145)
Total Bilirubin: 0.5 mg/dL (ref 0.2–1.2)
Total Protein: 7 g/dL (ref 6.0–8.3)

## 2020-11-20 LAB — LIPID PANEL
Cholesterol: 200 mg/dL (ref 0–200)
HDL: 77.8 mg/dL (ref >39.00)
LDL Cholesterol: 109 mg/dL — ABNORMAL HIGH (ref 0–99)
NonHDL: 122.12
Total CHOL/HDL Ratio: 3
Triglycerides: 65 mg/dL (ref 0.0–149.0)
VLDL: 13 mg/dL (ref 0.0–40.0)

## 2020-11-20 LAB — TSH: TSH: 0.57 u[IU]/mL (ref 0.35–4.50)

## 2020-11-20 MED ORDER — ROSUVASTATIN CALCIUM 10 MG PO TABS: 10.0000 mg | ORAL_TABLET | Freq: Every day | ORAL | 1 refills | Status: DC

## 2020-11-20 NOTE — Patient Instructions (Addendum)
  You can take up to 2000 mg of acetominophen (tylenol) every day safely  In divided doses (500 mg every 6 hours  Or 1000 mg every 12 hours.)    Reduce evening water intake after 7 pm  to just enough to take medications  Front load your water intake during the day.  Goal is 60 ounces  (48 may be more realistic )    I am changing your pravastatin to rosuvastatin because it is a higher potency, If you develop muscle aches from daily use ,  stop the rosuvastatin for a few weeks until symptoms resolve  then resume start with 2 doses per week  (Sunday and Wednesday, for example) and gradually increase the number of days as tolerated

## 2020-11-20 NOTE — Progress Notes (Signed)
Subjective:  Patient ID: Ana Cruz, female    DOB: 1934-05-22  Age: 87 y.o. MRN: [id number]  CC: The primary encounter diagnosis was Need for diphtheria-tetanus-pertussis (Tdap) vaccine. 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  HPI ASYRIA KOLANDER presents for follow up on multiple issues  This visit occurred during the SARS-CoV-2 public health emergency.  Safety protocols were in place, including screening questions prior to the visit, additional usage of staff PPE, and extensive cleaning of exam room while observing appropriate contact time as indicated for disinfecting solutions.    Chronic dizziness:  she has had several evaluations for this longstanding problem over the last several years.  including recurrent ER evaluations and repeat ENT evaluations.  We  reviewed MRI brain done March 2022 ordered by ENT  which noted evidence of prior right cerebellar stroke .  Reviewed MRI from 2019 ordered by ENT  also noted in MRI Brain done in 2019  Patient is taking her medications as prescribed and notes no adverse effects.  Home BP readings have been done about once per week and are  generally < 140/80 .  She is avoiding added salt in her diet and walking regularly about 3 times per week for exercise  .   Nocturia x 2-3.  Reviewed her  liquid intake  Neuropathy keeping her awake .  Sees neurology this summer    Outpatient Medications Prior to Visit  Medication Sig Dispense Refill   acetaminophen (TYLENOL) 500 MG tablet Take 500 mg by mouth every 6 (six) hours as needed (As needed for pain).     amLODipine (NORVASC) 5 MG tablet Take 1 tablet (5 mg total) by mouth daily. 90 tablet 3   Artificial Tear Ointment (EYE  LUBRICANT OP) Apply 1 drop to eye 2 (two) times daily.     Cholecalciferol (VITAMIN D3) 1000 UNITS CAPS Take 1,000 Units by mouth daily.     diltiazem (CARDIZEM) 30 MG tablet Take 30 mg by mouth 2 (two) times daily as needed.     furosemide (LASIX) 20 MG tablet TAKE 1 TABLET (20 MG TOTAL) BY MOUTH DAILY. AS NEEDED FOR FLUID RETENTION 90 tablet 1   levothyroxine (SYNTHROID) 88 MCG tablet Take 1 tablet (88 mcg total) by mouth daily before breakfast. 90 tablet 1   Probiotic Product (ACIDOPHILUS/GOAT MILK) CAPS Take by mouth.     telmisartan (MICARDIS) 80 MG tablet Take 80 mg by mouth daily.     vitamin B-12 (CYANOCOBALAMIN) 1000 MCG tablet Take 1 tablet (1,000 mcg total) by mouth daily. 90 tablet 3   warfarin (COUMADIN) 1 MG tablet Take 1 mg by mouth daily.      pravastatin (PRAVACHOL) 40 MG tablet Take 1 tablet (40 mg total) by mouth daily. 90 tablet 1   amoxicillin (AMOXIL) 500 MG capsule Take 500 mg by mouth. Take four tablets prior to dental work. (Patient not taking: Reported on 11/20/2020)     fluticasone (FLONASE) 50 MCG/ACT nasal spray Place 1 spray into both nostrils daily. 16 g 0   diazepam (VALIUM) 5 MG tablet SMARTSIG:1 Tablet(s) By Mouth (Patient not taking: Reported on 11/20/2020)     No facility-administered medications prior to visit.    Review of Systems;  Patient denies headache, fevers, malaise, unintentional weight loss, skin rash, eye pain, sinus congestion and sinus  pain, sore throat, dysphagia,  hemoptysis , cough, dyspnea, wheezing, chest pain, palpitations, orthopnea, edema, abdominal pain, nausea, melena, diarrhea, constipation, flank pain, dysuria, hematuria, urinary  Frequency, nocturia, numbness, tingling, seizures,  Focal weakness, Loss of consciousness,  Tremor, insomnia, depression, anxiety, and suicidal ideation.      Objective:  BP (!) 146/56 (BP Location: Left Arm, Patient Position: Sitting, Cuff Size: Large)   Pulse (!) 56   Temp 97.7 F (36.5 C) (Oral)   Ht  5\' 3"  (1.6 m)   Wt 166 lb 12.8 oz (75.7 kg)   SpO2 98%   BMI 29.55 kg/m   BP Readings from Last 3 Encounters:  11/20/20 (!) 146/56  11/17/20 136/76  06/26/20 (!) 148/74    Wt Readings from Last 3 Encounters:  11/20/20 166 lb 12.8 oz (75.7 kg)  11/17/20 166 lb 6.4 oz (75.5 kg)  06/26/20 165 lb 9.6 oz (75.1 kg)    General appearance: alert, cooperative and appears stated age Ears: normal TM's and external ear canals both ears Throat: lips, mucosa, and tongue normal; teeth and gums normal Neck: no adenopathy, no carotid bruit, supple, symmetrical, trachea midline and thyroid not enlarged, symmetric, no tenderness/mass/nodules Back: symmetric, no curvature. ROM normal. No CVA tenderness. Lungs: clear to auscultation bilaterally Heart: regular rate and rhythm, S1, S2 normal, no murmur, click, rub or gallop Abdomen: soft, non-tender; bowel sounds normal; no masses,  no organomegaly Pulses: 2+ and symmetric Skin: Skin color, texture, turgor normal. No rashes or lesions Lymph nodes: Cervical, supraclavicular, and axillary nodes normal.  Lab Results  Component Value Date   HGBA1C 5.7 05/22/2020   HGBA1C 5.8 01/18/2020   HGBA1C 5.8 01/05/2019    Lab Results  Component Value Date   CREATININE 1.19 11/20/2020   CREATININE 1.14 (H) 06/12/2020   CREATININE 1.26 (H) 01/18/2020    Lab Results  Component Value Date   WBC 7.5 06/12/2020   HGB 12.1 06/12/2020   HCT 37.3 06/12/2020   PLT 258 06/12/2020   GLUCOSE 89 11/20/2020   CHOL 200 11/20/2020   TRIG 65.0 11/20/2020   HDL 77.80 11/20/2020   LDLDIRECT 107.0 03/14/2017   LDLCALC 109 (H) 11/20/2020   ALT 11 11/20/2020   AST 16 11/20/2020   NA 140 11/20/2020   K 4.9 11/20/2020   CL 106 11/20/2020   CREATININE 1.19 11/20/2020   BUN 44 (H) 11/20/2020   CO2 26 11/20/2020   TSH 0.57 11/20/2020   HGBA1C 5.7 05/22/2020    MR BRAIN W WO CONTRAST  Result Date: 08/03/2020 CLINICAL DATA:  Dizziness; technologist note also  states right frontal headaches EXAM: MRI HEAD WITHOUT AND WITH CONTRAST TECHNIQUE: Multiplanar, multiecho pulse sequences of the brain and surrounding structures were obtained without and with intravenous contrast. CONTRAST:  75mL MULTIHANCE GADOBENATE DIMEGLUMINE 529 MG/ML IV SOLN COMPARISON:  01/01/2018 FINDINGS: Brain: There is no acute infarction or intracranial hemorrhage. There is no intracranial mass, mass effect, or edema. There is no hydrocephalus or extra-axial fluid collection. Ventricles and sulci are within normal limits in size and configuration. Patchy T2 hyperintensity in the supratentorial white matter is nonspecific probably reflects similar mild chronic microvascular ischemic changes. Small chronic right cerebellar infarct. No abnormal enhancement. Vascular: Major vessel flow voids at the skull base are preserved. Skull and upper cervical spine: Normal marrow signal is preserved. Sinuses/Orbits: Trace paranasal sinus mucosal thickening. Bilateral lens replacements. Other: Sella is unremarkable.  Mastoid air cells are clear. IMPRESSION: No evidence of recent infarction, hemorrhage, or mass. No  abnormal enhancement. Stable mild chronic microvascular ischemic changes. Small chronic right cerebellar infarct. Electronically Signed   By: Macy Mis M.D.   On: 08/03/2020 11:38    Assessment & Plan:   Problem List Items Addressed This Visit       Unprioritized   Hyperlipidemia    Managed with pravastatin . Goal LDL is < 70 given atherosclerotic disease noted on brain MRi's.  Changing to high potency statin  Lab Results  Component Value Date   CHOL 200 11/20/2020   HDL 77.80 11/20/2020   LDLCALC 109 (H) 11/20/2020   LDLDIRECT 107.0 03/14/2017   TRIG 65.0 11/20/2020   CHOLHDL 3 11/20/2020         Relevant Medications   rosuvastatin (CRESTOR) 10 MG tablet   Other Relevant Orders   Lipid panel (Completed)   Comprehensive metabolic panel (Completed)   Osteopenia   Peripheral  neuropathy    Etiology likely multifactorial , reviewed neurology's confirmation of severe bilateral polyneuropathy by EMG  studies done Sept 2019.  Continue annual  follow up with [facility] who is treating with Pamelor 10 mg daily (higher dose caused urinary incontinence  )          Relevant Medications   nortriptyline (PAMELOR) 10 MG capsule   Prediabetes    Her  random glucose was mildly elevated in August 2020 and her A1c suggested she is at risk for developing diabetes.  I recommend  that she follow a low glycemic index diet and particpate regularly in an aerobic  exercise activity.  We will repeat her A1c annually  Lab Results  Component Value Date   HGBA1C 5.7 05/22/2020          Spinal stenosis of lumbar region with radiculopathy    She has a history of lumbar radiculitis that resolved in 2016 with prednisone and tramadol, prescribed by Dr Sharlet Salina , and did not receive an ESI.  Last MRI of lumbar spine was in 2016 and noted borderline mild central stenosis at L3 . The last time she developed lower extremity weakness , she benefitted  From PT at [facility] to improve her balance and proximal muscle strength. She is walking in the pool three times weekl.  Encouraged to use a recumbent bike two times weekly to maintain proximal muscle strength.        Relevant Medications   nortriptyline (PAMELOR) 10 MG capsule   RESOLVED: Vertigo    Intermittent.  With evidence of old right cerebellar stroke on both MRIs reviewed today by report.       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    MRI brain done in 2019 and 2022 note old cerebellar infarct on the right,  Which was discussed with patient today (MRI's had been ordered by ENT both times for evaluation of vertigo)       Other Visit Diagnoses     Need for diphtheria-tetanus-pertussis (Tdap) vaccine    -  Primary   Hypothyroidism, unspecified type       Relevant Orders   TSH (Completed)   Estrogen deficiency        Relevant Orders   DG Bone Density      I provided  30 minutes of  face-to-face time during this encounter reviewing patient's current balance problems, recent ,labs and MRI' s providing counseling on the above mentioned problems , and coordination  of care .   I have discontinued Marcie Bal L. Wahab's pravastatin and diazepam. I am also having her  start on rosuvastatin and nortriptyline. Additionally, I am having her maintain her Vitamin D3, amoxicillin, warfarin, acetaminophen, Artificial Tear Ointment (EYE LUBRICANT OP), vitamin B-12, diltiazem, Acidophilus/Goat Milk, telmisartan, amLODipine, fluticasone, levothyroxine, and furosemide.  Meds ordered this encounter  Medications   rosuvastatin (CRESTOR) 10 MG tablet    Sig: Take 1 tablet (10 mg total) by mouth daily.    Dispense:  90 tablet    Refill:  1   nortriptyline (PAMELOR) 10 MG capsule    Sig: Take 1 capsule (10 mg total) by mouth at bedtime.    Dispense:  90 capsule    Refill:  1    Medications Discontinued During This Encounter  Medication Reason   pravastatin (PRAVACHOL) 40 MG tablet    diazepam (VALIUM) 5 MG tablet     Follow-up: No follow-ups on file.   Crecencio Mc, MD

## 2020-11-21 DIAGNOSIS — I69398 Other sequelae of cerebral infarction: Secondary | ICD-10-CM | POA: Insufficient documentation

## 2020-11-21 MED ORDER — NORTRIPTYLINE HCL 10 MG PO CAPS: 10.0000 mg | ORAL_CAPSULE | Freq: Every day | ORAL | 1 refills | Status: DC

## 2020-11-21 NOTE — Assessment & Plan Note (Signed)
MRI brain done in 2019 and 2022 note old cerebellar infarct on the right,  Which was discussed with patient today (MRI's had been ordered by ENT both times for evaluation of vertigo)

## 2020-11-21 NOTE — Assessment & Plan Note (Addendum)
Her  random glucose was mildly elevated in August 2020 and her A1c suggested she is at risk for developing diabetes.  I recommend  that she follow a low glycemic index diet and particpate regularly in an aerobic  exercise activity.  We will repeat her A1c annually  Lab Results  Component Value Date   HGBA1C 5.7 05/22/2020

## 2020-11-21 NOTE — Assessment & Plan Note (Signed)
She has a history of lumbar radiculitis that resolved in 2016 with prednisone and tramadol, prescribed by Dr Sharlet Salina , and did not receive an ESI.  Last MRI of lumbar spine was in 2016 and noted borderline mild central stenosis at L3 . The last time she developed lower extremity weakness , she benefitted  From PT at [facility] to improve her balance and proximal muscle strength. She is walking in the pool three times weekl.  Encouraged to use a recumbent bike two times weekly to maintain proximal muscle strength.

## 2020-11-21 NOTE — Assessment & Plan Note (Addendum)
Etiology likely multifactorial , reviewed neurology's confirmation of severe bilateral polyneuropathy by EMG  studies done Sept 2019.  Continue annual  follow up with [facility] who is treating with Pamelor 10 mg daily (higher dose caused urinary incontinence  )

## 2020-11-21 NOTE — Assessment & Plan Note (Signed)
Managed with pravastatin . Goal LDL is < 70 given atherosclerotic disease noted on brain MRi's.  Changing to high potency statin  Lab Results  Component Value Date   CHOL 200 11/20/2020   HDL 77.80 11/20/2020   LDLCALC 109 (H) 11/20/2020   LDLDIRECT 107.0 03/14/2017   TRIG 65.0 11/20/2020   CHOLHDL 3 11/20/2020

## 2020-11-21 NOTE — Assessment & Plan Note (Addendum)
Intermittent.  With evidence of old right cerebellar stroke on both MRIs reviewed today by report.

## 2020-12-18 DIAGNOSIS — E039 Hypothyroidism, unspecified: Secondary | ICD-10-CM

## 2020-12-18 MED ORDER — LEVOTHYROXINE SODIUM 88 MCG PO TABS
88.0000 ug | ORAL_TABLET | Freq: Every day | ORAL | 1 refills | Status: DC
Start: 2020-12-18 — End: 2021-06-20

## 2020-12-25 DIAGNOSIS — E782 Mixed hyperlipidemia: Secondary | ICD-10-CM

## 2020-12-26 ENCOUNTER — Other Ambulatory Visit: Payer: Self-pay

## 2020-12-26 ENCOUNTER — Ambulatory Visit
Admission: RE | Admit: 2020-12-26 | Discharge: 2020-12-26 | Disposition: A | Discharge status: Home / Self Care | Payer: Medicare Other | Source: Ambulatory Visit | Attending: Internal Medicine | Admitting: Internal Medicine

## 2020-12-26 DIAGNOSIS — E2839 Other primary ovarian failure: Secondary | ICD-10-CM | POA: Diagnosis present

## 2020-12-27 MED ORDER — PRAVASTATIN SODIUM 40 MG PO TABS: 40.0000 mg | ORAL_TABLET | Freq: Every day | ORAL | 1 refills | Status: DC

## 2020-12-27 NOTE — Addendum Note (Signed)
Addended by: Crecencio Mc on: 12/27/2020 02:29 PM   Modules accepted: Orders

## 2021-05-01 ENCOUNTER — Other Ambulatory Visit: Payer: Self-pay | Admitting: Internal Medicine

## 2021-05-24 ENCOUNTER — Ambulatory Visit: Payer: Medicare Other | Admitting: Internal Medicine

## 2021-05-30 ENCOUNTER — Ambulatory Visit (INDEPENDENT_AMBULATORY_CARE_PROVIDER_SITE_OTHER): Payer: Medicare Other | Admitting: Internal Medicine

## 2021-05-30 ENCOUNTER — Encounter: Payer: Self-pay | Admitting: Internal Medicine

## 2021-05-30 ENCOUNTER — Other Ambulatory Visit: Payer: Self-pay

## 2021-05-30 VITALS — BP 140/66 | HR 62 | Temp 97.9°F | Ht 63.0 in | Wt 164.0 lb

## 2021-05-30 DIAGNOSIS — I480 Paroxysmal atrial fibrillation: Secondary | ICD-10-CM

## 2021-05-30 DIAGNOSIS — Z7901 Long term (current) use of anticoagulants: Secondary | ICD-10-CM | POA: Diagnosis not present

## 2021-05-30 DIAGNOSIS — Z862 Personal history of diseases of the blood and blood-forming organs and certain disorders involving the immune mechanism: Secondary | ICD-10-CM | POA: Diagnosis not present

## 2021-05-30 DIAGNOSIS — E782 Mixed hyperlipidemia: Secondary | ICD-10-CM

## 2021-05-30 DIAGNOSIS — D6869 Other thrombophilia: Secondary | ICD-10-CM

## 2021-05-30 DIAGNOSIS — I10 Essential (primary) hypertension: Secondary | ICD-10-CM

## 2021-05-30 DIAGNOSIS — E039 Hypothyroidism, unspecified: Secondary | ICD-10-CM

## 2021-05-30 DIAGNOSIS — E538 Deficiency of other specified B group vitamins: Secondary | ICD-10-CM

## 2021-05-30 DIAGNOSIS — N1831 Chronic kidney disease, stage 3a: Secondary | ICD-10-CM

## 2021-05-30 DIAGNOSIS — Z1231 Encounter for screening mammogram for malignant neoplasm of breast: Secondary | ICD-10-CM

## 2021-05-30 MED ORDER — TETANUS-DIPHTH-ACELL PERTUSSIS 5-2.5-18.5 LF-MCG/0.5 IM SUSP
0.5000 mL | Freq: Once | INTRAMUSCULAR | 0 refills | Status: AC
Start: 2021-05-30 — End: 2021-05-30

## 2021-05-30 NOTE — Patient Instructions (Addendum)
You should try the following remedies for leg cramps/ jerking   1 shot glass of pickle juice 800 Ius of Viamin E Teaspoon of brown mustard   The Tdap (tetanus diphtheria pertussis ) vaccine is due for a boost,  but it is expensive currently and will be COVERED BY MEDICARE if you get it  at your pharmacy after January 1    If your blood pressure starts to drop too low (120 or lower),  you can reduce the amlodipine to 2.5 mg daily  Your annual mammogram has been ordered.  You are encouraged (required) to call to make your appointment at [facility]   for an appt in February

## 2021-05-30 NOTE — Assessment & Plan Note (Signed)
Thyroid function  Has been WNL on current dose of 88 mcg daily . Repeat level due    Lab Results  Component Value Date   TSH 0.57 11/20/2020

## 2021-05-30 NOTE — Assessment & Plan Note (Signed)
Secondary to hypertension.  She is on an ARB for control of hypertension,, and statin for control of hyperlipidemia. GFR is stable and she follows up with Dr Candiss Norse every 6 months   .   Lab Results  Component Value Date   CREATININE 1.19 11/20/2020

## 2021-05-30 NOTE — Assessment & Plan Note (Signed)
Now Managed with low dose metoprolol 12.5 mg taken once daily

## 2021-05-30 NOTE — Assessment & Plan Note (Signed)
Well controlled on current regimen. Renal function has ben  stable, no changes today.

## 2021-05-30 NOTE — Assessment & Plan Note (Signed)
secondary to atrial fibrillation.  She is tolerating use of warfarin  for embolic stroke risk mitigation due to  atrial fibrillation. Patient has no signs of bleeding and is advised to notify her specialists prior to any procedure that may required suspension of warfarin

## 2021-05-30 NOTE — Progress Notes (Signed)
Subjective:  Patient ID: Ana Cruz, female    DOB: 28-May-1934  Age: 87 y.o. MRN: [id number]  CC: The primary encounter diagnosis was Primary hypertension. Diagnoses of Acquired hypothyroidism, Mixed hyperlipidemia, Stage 3a chronic kidney disease ([place]), B12 deficiency, Long term (current) use of anticoagulants, History of anemia, Breast cancer screening by mammogram, Acquired thrombophilia ([place]), and Paroxysmal atrial fibrillation ([place]) were also pertinent to this visit.  HPI ADRIANE GABBERT presents for  Chief Complaint  Patient presents with   Follow-up    6 month follow up on hypertension, hyperlipidemia and hypothyroidism   This visit occurred during the SARS-CoV-2 public health emergency.  Safety protocols were in place, including screening questions prior to the visit, additional usage of staff PPE, and extensive cleaning of exam room while observing appropriate contact time as indicated for disinfecting solutions.    PAF:  Seeing cardiology for  management of PAF diagnosed with Holter monitor.  Previous episodes of RVR have resolved; typically would occur often at night but no longer occurring during sleep/morning.  Now having bradycardia .  No history of snoring.  .  Metoprolol tartrate reduced to 12.5 mg qhs with additional   dose advised   pulse ranges from 52 to 60 bpm.  Taking coumadin,  2 mg alternating with 1 mg  daily  Sees cardiology every 3 months.  RLS:  aggravated by lack of swimming/exercise  lifestyle changes  since her husband became less steady on his feet. .  Husband's balance is unstable and has been less inclined to walk or exercise daily. .   Hypertension: patient checks blood pressure  daily at home.  Readings have been for the most part < 130/80 at rest . Patient is following a reduce salt diet most days and is taking medications as prescribed    Outpatient Medications Prior to Visit  Medication Sig Dispense Refill   acetaminophen (TYLENOL) 500  MG tablet Take 500 mg by mouth every 6 (six) hours as needed (As needed for pain).     amLODipine (NORVASC) 5 MG tablet TAKE 1 TABLET (5 MG TOTAL) BY MOUTH DAILY. 90 tablet 3   amoxicillin (AMOXIL) 500 MG capsule Take 500 mg by mouth. Take four tablets prior to dental work.     Artificial Tear Ointment (EYE LUBRICANT OP) Apply 1 drop to eye 2 (two) times daily.     Cholecalciferol (VITAMIN D3) 1000 UNITS CAPS Take 1,000 Units by mouth daily.     fluticasone (FLONASE) 50 MCG/ACT nasal spray Place 1 spray into both nostrils daily. 16 g 0   levothyroxine (SYNTHROID) 88 MCG tablet Take 1 tablet (88 mcg total) by mouth daily before breakfast. 90 tablet 1   metoprolol tartrate (LOPRESSOR) 25 MG tablet Take 12.5 mg by mouth 2 (two) times daily.     pravastatin (PRAVACHOL) 40 MG tablet Take 1 tablet (40 mg total) by mouth daily. 90 tablet 1   Probiotic Product (PROBIOTIC-10) CHEW Chew 1 tablet by mouth daily.     telmisartan (MICARDIS) 80 MG tablet Take 80 mg by mouth daily.     vitamin B-12 (CYANOCOBALAMIN) 1000 MCG tablet Take 1 tablet (1,000 mcg total) by mouth daily. 90 tablet 3   warfarin (COUMADIN) 1 MG tablet Take 1 mg by mouth daily.      nortriptyline (PAMELOR) 10 MG capsule Take 1 capsule (10 mg total) by mouth at bedtime. 90 capsule 1   Probiotic Product (ACIDOPHILUS/GOAT MILK) CAPS Take by mouth.  furosemide (LASIX) 20 MG tablet TAKE 1 TABLET (20 MG TOTAL) BY MOUTH DAILY. AS NEEDED FOR FLUID RETENTION (Patient not taking: Reported on 05/30/2021) 90 tablet 1   diltiazem (CARDIZEM) 30 MG tablet Take 30 mg by mouth 2 (two) times daily as needed. (Patient not taking: Reported on 05/30/2021)     No facility-administered medications prior to visit.    Review of Systems;  Patient denies headache, fevers, malaise, unintentional weight loss, skin rash, eye pain, sinus congestion and sinus pain, sore throat, dysphagia,  hemoptysis , cough, dyspnea, wheezing, chest pain, palpitations, orthopnea,  edema, abdominal pain, nausea, melena, diarrhea, constipation, flank pain, dysuria, hematuria, urinary  Frequency, nocturia, numbness, tingling, seizures,  Focal weakness, Loss of consciousness,  Tremor, insomnia, depression, anxiety, and suicidal ideation.      Objective:  BP 140/66 (BP Location: Left Arm, Patient Position: Sitting, Cuff Size: Large)    Pulse 62    Temp 97.9 F (36.6 C) (Oral)    Ht 5' 3" (1.6 m)    Wt 164 lb (74.4 kg)    SpO2 95%    BMI 29.05 kg/m   BP Readings from Last 3 Encounters:  05/30/21 140/66  11/20/20 (!) 146/56  11/17/20 136/76    Wt Readings from Last 3 Encounters:  05/30/21 164 lb (74.4 kg)  11/20/20 166 lb 12.8 oz (75.7 kg)  11/17/20 166 lb 6.4 oz (75.5 kg)    General appearance: alert, cooperative and appears stated age Ears: normal TM's and external ear canals both ears Throat: lips, mucosa, and tongue normal; teeth and gums normal Neck: no adenopathy, no carotid bruit, supple, symmetrical, trachea midline and thyroid not enlarged, symmetric, no tenderness/mass/nodules Back: symmetric, no curvature. ROM normal. No CVA tenderness. Lungs: clear to auscultation bilaterally Heart: regular rate and rhythm, S1, S2 normal, no murmur, click, rub or gallop Abdomen: soft, non-tender; bowel sounds normal; no masses,  no organomegaly Pulses: 2+ and symmetric Skin: Skin color, texture, turgor normal. No rashes or lesions Lymph nodes: Cervical, supraclavicular, and axillary nodes normal.  Lab Results  Component Value Date   HGBA1C 5.7 05/22/2020   HGBA1C 5.8 01/18/2020   HGBA1C 5.8 01/05/2019    Lab Results  Component Value Date   CREATININE 1.19 11/20/2020   CREATININE 1.14 (H) 06/12/2020   CREATININE 1.26 (H) 01/18/2020    Lab Results  Component Value Date   WBC 7.5 06/12/2020   HGB 12.1 06/12/2020   HCT 37.3 06/12/2020   PLT 258 06/12/2020   GLUCOSE 89 11/20/2020   CHOL 200 11/20/2020   TRIG 65.0 11/20/2020   HDL 77.80 11/20/2020    LDLDIRECT 107.0 03/14/2017   LDLCALC 109 (H) 11/20/2020   ALT 11 11/20/2020   AST 16 11/20/2020   NA 140 11/20/2020   K 4.9 11/20/2020   CL 106 11/20/2020   CREATININE 1.19 11/20/2020   BUN 44 (H) 11/20/2020   CO2 26 11/20/2020   TSH 0.57 11/20/2020   HGBA1C 5.7 05/22/2020    DG Bone Density  Result Date: 12/27/2020 EXAM: DUAL X-RAY ABSORPTIOMETRY (DXA) FOR BONE MINERAL DENSITY IMPRESSION: Your patient Artemisa Sladek completed a BMD test on 12/26/2020 using the [employer] iDXA DXA System (software version: 14.10) manufactured by [employer]. The following summarizes the results of our evaluation. Technologist: [facility] PATIENT BIOGRAPHICAL: Name: Siri, Buege Patient ID: [id number] Birth Date: 1933/11/02 Height: 63.0 in. Gender: Female Exam Date: 12/26/2020 Weight: 166.8 lbs. Indications: Advanced Age, Bilateral Knee Replacements, Caucasian, Family Hx of Osteoporosis, Height Loss,  High Risk Meds, History of Fracture (Adult), Hypothyroid, Hysterectomy, Oophorectomy Bilateral, Osteopenia, Postmenopausal Fractures: sacral Treatments: ASPRIN 81 MG, claritin, Coumidin, Flonase, pravastatin, Synthroid, Vit D DENSITOMETRY RESULTS: Site      Region      Measured Date Measured Age WHO Classification Young Adult T-score BMD         %Change vs. Previous Significant Change (*) AP Spine L1-L4 12/26/2020 85.4 Normal 3.0 1.563 g/cm2 14.4% Yes AP Spine L1-L4 05/15/2011 75.8 Normal 1.4 1.366 g/cm2 5.0% Yes AP Spine L1-L4 10/05/2008 73.2 Normal 0.9 1.301 g/cm2 - - DualFemur Total Right 12/26/2020 85.4 Osteopenia -1.8 0.776 g/cm2 -0.6% - DualFemur Total Right 09/19/2015 80.1 Osteopenia -1.8 0.781 g/cm2 -4.1% Yes DualFemur Total Right 01/11/2014 78.4 Osteopenia -1.5 0.814 g/cm2 12.0% Yes DualFemur Total Right 05/15/2011 75.8 Osteopenia -2.2 0.727 g/cm2 -12.8% Yes DualFemur Total Right 10/05/2008 73.2 Osteopenia -1.4 0.834 g/cm2 - - DualFemur Total Mean 12/26/2020 85.4 Osteopenia -1.5 0.820 g/cm2 -1.8% -  DualFemur Total Mean 09/19/2015 80.1 Osteopenia -1.4 0.835 g/cm2 -3.0% Yes DualFemur Total Mean 01/11/2014 78.4 Osteopenia -1.2 0.861 g/cm2 11.5% Yes DualFemur Total Mean 05/15/2011 75.8 Osteopenia -1.9 0.772 g/cm2 -12.4% Yes DualFemur Total Mean 10/05/2008 73.2 Normal -1.0 0.881 g/cm2 - - ASSESSMENT: The BMD measured at Femur Total Right is 0.776 g/cm2 with a T-score of -1.8. This patient is considered osteopenic according to [facility]) criteria. The scan quality is good. Compared with prior study, there has been a significant increase in the spine. Compared with prior study, there has been no significant change in the total hip. World Pharmacologist [facility]) criteria for post-menopausal, Caucasian Women: Normal:                   T-score at or above -1 SD Osteopenia/low bone mass: T-score between -1 and -2.5 SD Osteoporosis:             T-score at or below -2.5 SD RECOMMENDATIONS: 1. All patients should optimize calcium and vitamin D intake. 2. Consider FDA-approved medical therapies in postmenopausal women and men aged 19 years and older, based on the following: a. A hip or vertebral(clinical or morphometric) fracture b. T-score < -2.5 at the femoral neck or spine after appropriate evaluation to exclude secondary causes c. Low bone mass (T-score between -1.0 and -2.5 at the femoral neck or spine) and a 10-year probability of a hip fracture > 3% or a 10-year probability of a major osteoporosis-related fracture > 20% based on the [place]-adapted WHO algorithm 3. Clinician judgment and/or patient preferences may indicate treatment for people with 10-year fracture probabilities above or below these levels FOLLOW-UP: People with diagnosed cases of osteoporosis or at high risk for fracture should have regular bone mineral density tests. For patients eligible for Medicare, routine testing is allowed once every 2 years. The testing frequency can be increased to one year for patients who have rapidly  progressing disease, those who are receiving or discontinuing medical therapy to restore bone mass, or have additional risk factors. I have reviewed this report, and agree with the above findings. [facility] Dear Crecencio Mc, Your patient KAYLANY TESORIERO completed a FRAX assessment on 12/26/2020 using the [place] (analysis version: 14.10) manufactured by [employer]. The following summarizes the results of our evaluation. PATIENT BIOGRAPHICAL: Name: Kiyona, Mcnall Patient ID: [id number] Birth Date: Apr 26, 1934 Height:    63.0 in. Gender:     [age]    [age]  Weight:    166.8 lbs. Ethnicity:  White                            Exam Date: 12/26/2020 FRAX* RESULTS:  (version: 3.5) 10-year Probability of Fracture1 Major Osteoporotic Fracture2 Hip Fracture 16.1% 3.3% Population: [place] (Caucasian) Risk Factors: History of Fracture (Adult) Based on Femur (Right) Neck BMD 1 -The 10-year probability of fracture may be lower than reported if the patient has received treatment. 2 -Major Osteoporotic Fracture: Clinical Spine, Forearm, Hip or Shoulder *FRAX is a Materials engineer of the [employer] for Metabolic Bone Disease, a [place] (WHO) [facility]. ASSESSMENT: The probability of a major osteoporotic fracture is 16.1% within the next ten years. The probability of a hip fracture is 3.3% within the next ten years. . Electronically Signed   By: Rolm Baptise M.D.   On: 12/27/2020 08:48    Assessment & Plan:   Problem List Items Addressed This Visit     Breast cancer screening by mammogram   Relevant Orders   MM 3D SCREEN BREAST BILATERAL   CKD (chronic kidney disease) stage 3, GFR 30-59 ml/min (HCC)    Secondary to hypertension.  She is on an ARB for control of hypertension,, and statin for control of hyperlipidemia. GFR is stable and she follows up with Dr Candiss Norse every 6 months   .   Lab Results   Component Value Date   CREATININE 1.19 11/20/2020         Relevant Orders   VITAMIN D 25 Hydroxy (Vit-D Deficiency, Fractures)   Hypertension - Primary    Well controlled on current regimen. Renal function has ben  stable, no changes today.      Relevant Medications   metoprolol tartrate (LOPRESSOR) 25 MG tablet   Other Relevant Orders   Comp Met (CMET)   Paroxysmal atrial fibrillation (HCC)    Now Managed with low dose metoprolol 12.5 mg taken once daily       Relevant Medications   metoprolol tartrate (LOPRESSOR) 25 MG tablet   Hyperlipidemia   Relevant Medications   metoprolol tartrate (LOPRESSOR) 25 MG tablet   Other Relevant Orders   Lipid Profile   Acquired hypothyroidism    Thyroid function  Has been WNL on current dose of 88 mcg daily . Repeat level due    Lab Results  Component Value Date   TSH 0.57 11/20/2020         Relevant Medications   metoprolol tartrate (LOPRESSOR) 25 MG tablet   Other Relevant Orders   TSH   Acquired thrombophilia ([place])    secondary to atrial fibrillation.  She is tolerating use of warfarin  for embolic stroke risk mitigation due to  atrial fibrillation. Patient has no signs of bleeding and is advised to notify her specialists prior to any procedure that may required suspension of warfarin       Other Visit Diagnoses     B12 deficiency       Relevant Orders   Vitamin B12   Long term (current) use of anticoagulants       Relevant Orders   CBC with Differential/Platelet   History of anemia       Relevant Orders   IBC + Ferritin       I have discontinued Marcie Bal L. Delman's diltiazem, Acidophilus/Goat Milk, and nortriptyline. I am also having her start on Tdap. Additionally, I am having her  maintain her Vitamin D3, amoxicillin, warfarin, acetaminophen, Artificial Tear Ointment (EYE LUBRICANT OP), vitamin B-12, telmisartan, fluticasone, furosemide, levothyroxine, pravastatin, amLODipine, Probiotic-10, and metoprolol  tartrate.  Meds ordered this encounter  Medications   Tdap (BOOSTRIX) 5-2.5-18.5 LF-MCG/0.5 injection    Sig: Inject 0.5 mLs into the muscle once for 1 dose.    Dispense:  0.5 mL    Refill:  0     I provided  30 minutes of  face-to-face time during this encounter reviewing patient's current problems and past surgeries, labs and imaging studies, providing counseling on the above mentioned problems , and coordination  of care .   Follow-up: Return in about 6 months (around 11/28/2021).   Crecencio Mc, MD

## 2021-05-31 LAB — CBC WITH DIFFERENTIAL/PLATELET
Basophils Absolute: 0.1 10*3/uL (ref 0.0–0.1)
Basophils Relative: 0.8 % (ref 0.0–3.0)
Eosinophils Absolute: 0.2 10*3/uL (ref 0.0–0.7)
Eosinophils Relative: 2.6 % (ref 0.0–5.0)
HCT: 34.4 % — ABNORMAL LOW (ref 36.0–46.0)
Hemoglobin: 11.2 g/dL — ABNORMAL LOW (ref 12.0–15.0)
Lymphocytes Relative: 23.1 % (ref 12.0–46.0)
Lymphs Abs: 1.6 10*3/uL (ref 0.7–4.0)
MCHC: 32.4 g/dL (ref 30.0–36.0)
MCV: 95.5 fl (ref 78.0–100.0)
Monocytes Absolute: 0.6 10*3/uL (ref 0.1–1.0)
Monocytes Relative: 7.9 % (ref 3.0–12.0)
Neutro Abs: 4.6 10*3/uL (ref 1.4–7.7)
Neutrophils Relative %: 65.6 % (ref 43.0–77.0)
Platelets: 259 10*3/uL (ref 150.0–400.0)
RBC: 3.6 Mil/uL — ABNORMAL LOW (ref 3.87–5.11)
RDW: 14.5 % (ref 11.5–15.5)
WBC: 7 10*3/uL (ref 4.0–10.5)

## 2021-05-31 LAB — TSH: TSH: 0.73 u[IU]/mL (ref 0.35–5.50)

## 2021-05-31 LAB — COMPREHENSIVE METABOLIC PANEL
ALT: 10 U/L (ref 0–35)
AST: 16 U/L (ref 0–37)
Albumin: 3.9 g/dL (ref 3.5–5.2)
Alkaline Phosphatase: 78 U/L (ref 39–117)
BUN: 46 mg/dL — ABNORMAL HIGH (ref 6–23)
CO2: 27 mEq/L (ref 19–32)
Calcium: 9.3 mg/dL (ref 8.4–10.5)
Chloride: 107 mEq/L (ref 96–112)
Creatinine, Ser: 1.31 mg/dL — ABNORMAL HIGH (ref 0.40–1.20)
GFR: 37.06 mL/min — ABNORMAL LOW (ref >60.00)
Glucose, Bld: 132 mg/dL — ABNORMAL HIGH (ref 70–99)
Potassium: 4.6 mEq/L (ref 3.5–5.1)
Sodium: 141 mEq/L (ref 135–145)
Total Bilirubin: 0.3 mg/dL (ref 0.2–1.2)
Total Protein: 6.8 g/dL (ref 6.0–8.3)

## 2021-05-31 LAB — IBC + FERRITIN
Ferritin: 156.9 ng/mL (ref 10.0–291.0)
Iron: 70 ug/dL (ref 42–145)
Saturation Ratios: 24.2 % (ref 20.0–50.0)
TIBC: 289.8 ug/dL (ref 250.0–450.0)
Transferrin: 207 mg/dL — ABNORMAL LOW (ref 212.0–360.0)

## 2021-05-31 LAB — LIPID PANEL
Cholesterol: 204 mg/dL — ABNORMAL HIGH (ref 0–200)
HDL: 65.3 mg/dL (ref >39.00)
LDL Cholesterol: 117 mg/dL — ABNORMAL HIGH (ref 0–99)
NonHDL: 138.72
Total CHOL/HDL Ratio: 3
Triglycerides: 108 mg/dL (ref 0.0–149.0)
VLDL: 21.6 mg/dL (ref 0.0–40.0)

## 2021-05-31 LAB — VITAMIN D 25 HYDROXY (VIT D DEFICIENCY, FRACTURES): VITD: 47.32 ng/mL (ref 30.00–100.00)

## 2021-05-31 LAB — VITAMIN B12: Vitamin B-12: >1550 pg/mL — ABNORMAL HIGH (ref 211–911)

## 2021-06-19 ENCOUNTER — Other Ambulatory Visit: Payer: Self-pay | Admitting: Internal Medicine

## 2021-06-19 DIAGNOSIS — E782 Mixed hyperlipidemia: Secondary | ICD-10-CM

## 2021-06-19 DIAGNOSIS — E039 Hypothyroidism, unspecified: Secondary | ICD-10-CM

## 2021-06-21 ENCOUNTER — Other Ambulatory Visit: Payer: Self-pay | Admitting: Family

## 2021-06-21 ENCOUNTER — Telehealth: Payer: Self-pay | Admitting: *Deleted

## 2021-06-21 DIAGNOSIS — E039 Hypothyroidism, unspecified: Secondary | ICD-10-CM

## 2021-06-21 DIAGNOSIS — R7303 Prediabetes: Secondary | ICD-10-CM

## 2021-06-21 DIAGNOSIS — I10 Essential (primary) hypertension: Secondary | ICD-10-CM

## 2021-06-21 DIAGNOSIS — E7800 Pure hypercholesterolemia, unspecified: Secondary | ICD-10-CM

## 2021-06-21 NOTE — Telephone Encounter (Signed)
Please place future orders for lab appt.  

## 2021-06-28 ENCOUNTER — Other Ambulatory Visit: Payer: Self-pay

## 2021-06-28 ENCOUNTER — Other Ambulatory Visit (INDEPENDENT_AMBULATORY_CARE_PROVIDER_SITE_OTHER): Payer: Medicare Other

## 2021-06-28 DIAGNOSIS — E039 Hypothyroidism, unspecified: Secondary | ICD-10-CM

## 2021-06-28 DIAGNOSIS — I10 Essential (primary) hypertension: Secondary | ICD-10-CM

## 2021-06-28 DIAGNOSIS — R7303 Prediabetes: Secondary | ICD-10-CM | POA: Diagnosis not present

## 2021-06-28 DIAGNOSIS — E7800 Pure hypercholesterolemia, unspecified: Secondary | ICD-10-CM | POA: Diagnosis not present

## 2021-06-28 LAB — TSH: TSH: 0.57 u[IU]/mL (ref 0.35–5.50)

## 2021-06-28 LAB — CBC WITH DIFFERENTIAL/PLATELET
Basophils Absolute: 0 10*3/uL (ref 0.0–0.1)
Basophils Relative: 0.5 % (ref 0.0–3.0)
Eosinophils Absolute: 0.3 10*3/uL (ref 0.0–0.7)
Eosinophils Relative: 3.8 % (ref 0.0–5.0)
HCT: 35.5 % — ABNORMAL LOW (ref 36.0–46.0)
Hemoglobin: 11.4 g/dL — ABNORMAL LOW (ref 12.0–15.0)
Lymphocytes Relative: 34.3 % (ref 12.0–46.0)
Lymphs Abs: 2.4 10*3/uL (ref 0.7–4.0)
MCHC: 32 g/dL (ref 30.0–36.0)
MCV: 94.6 fl (ref 78.0–100.0)
Monocytes Absolute: 0.7 10*3/uL (ref 0.1–1.0)
Monocytes Relative: 9.8 % (ref 3.0–12.0)
Neutro Abs: 3.6 10*3/uL (ref 1.4–7.7)
Neutrophils Relative %: 51.6 % (ref 43.0–77.0)
Platelets: 224 10*3/uL (ref 150.0–400.0)
RBC: 3.75 Mil/uL — ABNORMAL LOW (ref 3.87–5.11)
RDW: 13.9 % (ref 11.5–15.5)
WBC: 6.9 10*3/uL (ref 4.0–10.5)

## 2021-06-28 LAB — LIPID PANEL
Cholesterol: 183 mg/dL (ref 0–200)
HDL: 61.4 mg/dL (ref >39.00)
LDL Cholesterol: 104 mg/dL — ABNORMAL HIGH (ref 0–99)
NonHDL: 121.75
Total CHOL/HDL Ratio: 3
Triglycerides: 88 mg/dL (ref 0.0–149.0)
VLDL: 17.6 mg/dL (ref 0.0–40.0)

## 2021-06-29 ENCOUNTER — Other Ambulatory Visit: Payer: Medicare Other

## 2021-06-29 DIAGNOSIS — R7303 Prediabetes: Secondary | ICD-10-CM

## 2021-06-29 NOTE — Progress Notes (Signed)
I called and LM with the patients husband and he informed me to call back in 45 mins.  Jamiere Gulas,cma

## 2021-06-29 NOTE — Addendum Note (Signed)
Addended by: Leeanne Rio on: 06/29/2021 03:15 PM   Modules accepted: Orders

## 2021-06-30 LAB — HEMOGLOBIN A1C
Hgb A1c MFr Bld: 5.7 % of total Hgb — ABNORMAL HIGH (ref <5.7)
Mean Plasma Glucose: 117 mg/dL
eAG (mmol/L): 6.5 mmol/L

## 2021-08-28 ENCOUNTER — Ambulatory Visit
Admission: RE | Admit: 2021-08-28 | Discharge: 2021-08-28 | Disposition: A | Discharge status: Home / Self Care | Payer: Medicare Other | Source: Ambulatory Visit | Attending: Internal Medicine | Admitting: Internal Medicine

## 2021-08-28 ENCOUNTER — Other Ambulatory Visit: Payer: Self-pay

## 2021-08-28 DIAGNOSIS — Z1231 Encounter for screening mammogram for malignant neoplasm of breast: Secondary | ICD-10-CM | POA: Diagnosis not present

## 2021-11-20 ENCOUNTER — Ambulatory Visit (INDEPENDENT_AMBULATORY_CARE_PROVIDER_SITE_OTHER): Payer: Medicare Other

## 2021-11-20 VITALS — BP 121/69 | HR 60 | Resp 15 | Ht 63.0 in | Wt 153.8 lb

## 2021-11-20 DIAGNOSIS — Z0000 Encounter for general adult medical examination without abnormal findings: Secondary | ICD-10-CM | POA: Diagnosis not present

## 2021-11-20 NOTE — Progress Notes (Addendum)
Subjective:   Ana Cruz is a 88 y.o. female who presents for Medicare Annual (Subsequent) preventive examination.  Review of Systems    No ROS.  Medicare Wellness   Cardiac Risk Factors include: advanced age (>70mn, >>3women)     Objective:    Today's Vitals   11/20/21 1015  BP: 121/69  Pulse: 60  Resp: 15  SpO2: 98%  Weight: 153 lb 12.8 oz (69.8 kg)  Height: '5\' 3"'$  (1.6 m)   Body mass index is 27.24 kg/m.     11/20/2021   10:33 AM 11/17/2020   11:34 AM 06/12/2020    4:31 PM 11/17/2019   11:45 AM 02/22/2019   12:41 PM 11/16/2018   11:48 AM 12/04/2017    2:47 PM  Advanced Directives  Does Patient Have a Medical Advance Directive? Yes Yes No Yes Yes Yes No  Type of AParamedicof [place]Living will [place] will  [place] will  [place]   Does patient want to make changes to medical advance directive? No - Patient declined No - Patient declined  No - Patient declined  No - Patient declined   Copy of [place] Chart? Yes - validated most recent copy scanned in chart (See row information) Yes - validated most recent copy scanned in chart (See row information)  No - copy requested  No - copy requested   Would patient like information on creating a medical advance directive?   No - Patient declined    No - Patient declined    Current Medications (verified) Outpatient Encounter Medications as of 11/20/2021  Medication Sig   acetaminophen (TYLENOL) 500 MG tablet Take 500 mg by mouth every 6 (six) hours as needed (As needed for pain).   amLODipine (NORVASC) 5 MG tablet TAKE 1 TABLET (5 MG TOTAL) BY MOUTH DAILY.   amoxicillin (AMOXIL) 500 MG capsule Take 500 mg by mouth. Take four tablets prior to dental work.   Artificial Tear Ointment (EYE LUBRICANT OP) Apply 1 drop to eye 2 (two) times daily.   Cholecalciferol (VITAMIN D3) 1000 UNITS CAPS  Take 1,000 Units by mouth daily.   fluticasone (FLONASE) 50 MCG/ACT nasal spray Place 1 spray into both nostrils daily.   furosemide (LASIX) 20 MG tablet TAKE 1 TABLET (20 MG TOTAL) BY MOUTH DAILY. AS NEEDED FOR FLUID RETENTION (Patient not taking: Reported on 05/30/2021)   levothyroxine (SYNTHROID) 88 MCG tablet TAKE 1 TABLET BY MOUTH DAILY BEFORE BREAKFAST.   metoprolol tartrate (LOPRESSOR) 25 MG tablet Take 12.5 mg by mouth 2 (two) times daily.   pravastatin (PRAVACHOL) 40 MG tablet TAKE 1 TABLET BY MOUTH EVERY DAY   Probiotic Product (PROBIOTIC-10) CHEW Chew 1 tablet by mouth daily.   telmisartan (MICARDIS) 80 MG tablet Take 80 mg by mouth daily.   vitamin B-12 (CYANOCOBALAMIN) 1000 MCG tablet Take 1 tablet (1,000 mcg total) by mouth daily.   warfarin (COUMADIN) 1 MG tablet Take 1 mg by mouth daily.    No facility-administered encounter medications on file as of 11/20/2021.    Allergies (verified) Pamelor [nortriptyline], Chocolate flavor, Clindamycin/lincomycin, Onion, Other, Tape, and Hctz [hydrochlorothiazide]   History: Past Medical History:  Diagnosis Date   allergic rhinitis    Atrial fibrillation (HCC)    Chronic kidney disease, stage I    mild, did improve with cessation of NSAIDs   Elbow fracture, left    repaired Jan 2000  Hyperlipidemia    Hypertension    hypothyroidism    medicated since age 36, no prior surgery   Spinal stenosis    has L5 disk herniation with nerve root displacement   Past Surgical History:  Procedure Laterality Date   ABDOMINAL HYSTERECTOMY  1979   APPENDECTOMY  1969   BILATERAL OOPHORECTOMY     squential   BLADDER REPAIR  1980   OVARIAN CYST REMOVAL  April 1969   REPLACEMENT TOTAL KNEE BILATERAL  2009   reverse shoulder surgery  04/05/2016   ROTATOR CUFF REPAIR  Jan 2000   ROTATOR CUFF REPAIR  2000   Family History  Problem Relation Age of Onset   Hypertension Mother    Dementia Mother    Heart disease Father 52       MI   Cancer  Paternal Grandfather 18       colon   Breast cancer Neg Hx    Social History   Socioeconomic History   Marital status: Married    Spouse name: Not on file   Number of children: 4   Years of education: 13.5   Highest education level: Not on file  Occupational History   Occupation: retired  Tobacco Use   Smoking status: Never   Smokeless tobacco: Never  Vaping Use   Vaping Use: Never used  Substance and Sexual Activity   Alcohol use: Not Currently   Drug use: No   Sexual activity: Not on file  Other Topics Concern   Not on file  Social History Narrative   Married    [place]    Right handed   One story   Social Determinants of Health   Financial Resource Strain: Low Risk  (11/20/2021)   Overall Financial Resource Strain (CARDIA)    Difficulty of Paying Living Expenses: Not hard at all  Food Insecurity: No Food Insecurity (11/20/2021)   Hunger Vital Sign    Worried About Running Out of Food in the Last Year: Never true    Ran Out of Food in the Last Year: Never true  Transportation Needs: No Transportation Needs (11/20/2021)   PRAPARE - Hydrologist (Medical): No    Lack of Transportation (Non-Medical): No  Physical Activity: Sufficiently Active (11/20/2021)   Exercise Vital Sign    Days of Exercise per Week: 3 days    Minutes of Exercise per Session: 60 min  Stress: No Stress Concern Present (11/20/2021)   [place]    Feeling of Stress : Not at all  Social Connections: Unknown (11/20/2021)   Social Connection and Isolation Panel [NHANES]    Frequency of Communication with Friends and Family: More than three times a week    Frequency of Social Gatherings with Friends and Family: More than three times a week    Attends Religious Services: Not on Advertising copywriter or Organizations: Not on file    Attends Archivist Meetings: Not on file     Marital Status: Married    Tobacco Counseling Counseling given: Not Answered   Clinical Intake:  Pre-visit preparation completed: Yes        Diabetes: No  How often do you need to have someone help you when you read instructions, pamphlets, or other written materials from your doctor or pharmacy?: 1 - Never  Interpreter Needed?: No      Activities of Daily Living  11/20/2021   10:36 AM  In your present state of health, do you have any difficulty performing the following activities:  Hearing? 0  Vision? 0  Difficulty concentrating or making decisions? 0  Comment Cane in use when walking  Dressing or bathing? 0  Doing errands, shopping? 0  Preparing Food and eating ? N  Using the Toilet? N  In the past six months, have you accidently leaked urine? N  Do you have problems with loss of bowel control? N  Managing your Medications? N  Managing your Finances? N  Housekeeping or managing your Housekeeping? N    Patient Care Team: Crecencio Mc, MD as PCP - General (Internal Medicine) Alda Berthold, DO as Consulting Physician (Neurology)  Indicate any recent Medical Services you may have received from other than [facility] providers in the past year (date may be approximate).     Assessment:   This is a routine wellness examination for Ana Cruz.  Hearing/Vision screen Hearing Screening - Comments:: Patient is able to hear conversational tones without difficulty. Audiology test scheduled in July 2023 for a baseline.  Vision Screening - Comments:: Followed by [facility]  Wears corrective lenses  Cataract extraction, bilateral  They have regular follow up with the ophthalmologist  Dietary issues and exercise activities discussed: Current Exercise Habits: Home exercise routine, Time (Minutes): 60, Frequency (Times/Week): 3, Weekly Exercise (Minutes/Week): 180, Intensity: Mild   Goals Addressed               This Visit's Progress     Patient Stated      Weight goal 145lb (pt-stated)        Portion control  Reduce sugar intake        Depression Screen    11/20/2021   10:28 AM 05/30/2021    3:13 PM 11/17/2020   11:31 AM 11/17/2019   11:46 AM 11/16/2018   11:49 AM 01/30/2018   11:33 AM 09/11/2017    1:32 PM  PHQ 2/9 Scores  PHQ - 2 Score 0 0 0 0 0 0 0    Fall Risk    11/20/2021   10:35 AM 05/30/2021    3:12 PM 11/20/2020   10:08 AM 11/17/2020   11:35 AM 06/26/2020    2:38 PM  Fall Risk   Falls in the past year? 0 0 0 0 0  Number falls in past yr: 0  0 0   Injury with Fall?   0 0   Risk for fall due to :  No Fall Risks Impaired balance/gait    Follow up Falls evaluation completed Falls evaluation completed Falls evaluation completed Falls evaluation completed Falls evaluation completed    [place]: Home free of loose throw rugs in walkways, pet beds, electrical cords, etc? Yes  Adequate lighting in your home to reduce risk of falls? Yes   ASSISTIVE DEVICES UTILIZED TO PREVENT FALLS: Use of a cane, walker or w/c? Yes   TIMED UP AND GO: Was the test performed? Yes .  Length of time to ambulate 10 feet: 10 sec.  Gait steady and fast with assistive device  Cognitive Function:  Patient is alert and oriented x3.  Enjoys cross stitching, puzzles and other brain health stimulating activities.       11/17/2019   12:02 PM 11/16/2018   11:52 AM 09/10/2016    1:47 PM  6CIT Screen  What Year? 0 points 0 points 0 points  What month? 0  points 0 points 0 points  What time?  0 points 0 points  Count back from 20  0 points 0 points  Months in reverse 0 points 0 points 0 points  Repeat phrase 0 points 0 points 0 points  Total Score  0 points 0 points   Immunizations Immunization History  Administered Date(s) Administered   Influenza Split 03/31/2012, 04/01/2013, 02/22/2014   Influenza Whole 03/05/2011   Influenza, High Dose Seasonal PF 02/20/2016, 03/14/2017   Influenza,inj,Quad PF,6+ Mos  02/21/2015   Influenza-Unspecified 03/19/2018, 03/18/2019, 03/02/2020, 03/30/2021   Moderna Covid-19 Vaccine Bivalent Booster 68yr & up 02/28/2021   Moderna Sars-Covid-2 Vaccination 06/18/2019, 07/16/2019, 04/18/2020, 10/19/2020   Pneumococcal Conjugate-13 08/02/2013   Pneumococcal Polysaccharide-23 04/09/1999, 04/09/2011   Tdap 09/19/2008   Zoster Recombinat (Shingrix) 02/15/2019, 03/17/2019   Zoster, Live 01/20/2007    Screening Tests Health Maintenance  Topic Date Due   INFLUENZA VACCINE  01/01/2022   TETANUS/TDAP  08/15/2031   Pneumonia Vaccine 88 Years old  Completed   DEXA SCAN  Completed   COVID-19 Vaccine  Completed   Zoster Vaccines- Shingrix  Completed   HPV VACCINES  Aged Out   Health Maintenance There are no preventive care reminders to display for this patient.  Lung Cancer Screening: (Low Dose CT Chest recommended if Age 86-80years, 30 pack-year currently smoking OR have quit w/in 15years.) does not qualify.   Vision Screening: Recommended annual ophthalmology exams for early detection of glaucoma and other disorders of the eye.  Dental Screening: Recommended annual dental exams for proper oral hygiene  Community Resource Referral / Chronic Care Management: CRR required this visit?  No   CCM required this visit?  No      Plan:   Keep all routine maintenance appointments.   I have personally reviewed and noted the following in the patient's chart:   Medical and social history Use of alcohol, tobacco or illicit drugs  Current medications and supplements including opioid prescriptions.  Functional ability and status Nutritional status Physical activity Advanced directives List of other physicians Hospitalizations, surgeries, and ER visits in previous 12 months Vitals Screenings to include cognitive, depression, and falls Referrals and appointments  In addition, I have reviewed and discussed with patient certain preventive protocols, quality  metrics, and best practice recommendations. A written personalized care plan for preventive services as well as general preventive health recommendations were provided to patient.     OBrien-Blaney, Shaneika Rossa L, LPN   [id number]/27/[id number]    I have reviewed the above information and agree with above.   TDeborra Medina MD

## 2021-11-20 NOTE — Patient Instructions (Addendum)
  Ms. Korpela , Thank you for taking time to come for your Medicare Wellness Visit. I appreciate your ongoing commitment to your health goals. Please review the following plan we discussed and let me know if I can assist you in the future.   These are the goals we discussed:  Goals       Patient Stated     Weight goal 145lb (pt-stated)      Portion control  Reduce sugar intake         This is a list of the screening recommended for you and due dates:  Health Maintenance  Topic Date Due   Flu Shot  01/01/2022   Tetanus Vaccine  08/15/2031   Pneumonia Vaccine  Completed   DEXA scan (bone density measurement)  Completed   COVID-19 Vaccine  Completed   Zoster (Shingles) Vaccine  Completed   HPV Vaccine  Aged Out

## 2021-11-28 ENCOUNTER — Encounter: Payer: Self-pay | Admitting: Internal Medicine

## 2021-11-28 ENCOUNTER — Ambulatory Visit (INDEPENDENT_AMBULATORY_CARE_PROVIDER_SITE_OTHER): Payer: Medicare Other | Admitting: Internal Medicine

## 2021-11-28 VITALS — BP 112/62 | HR 62 | Temp 97.6°F | Ht 63.0 in | Wt 154.2 lb

## 2021-11-28 DIAGNOSIS — R7303 Prediabetes: Secondary | ICD-10-CM | POA: Diagnosis not present

## 2021-11-28 DIAGNOSIS — E7800 Pure hypercholesterolemia, unspecified: Secondary | ICD-10-CM | POA: Diagnosis not present

## 2021-11-28 DIAGNOSIS — I872 Venous insufficiency (chronic) (peripheral): Secondary | ICD-10-CM

## 2021-11-28 DIAGNOSIS — N1832 Chronic kidney disease, stage 3b: Secondary | ICD-10-CM

## 2021-11-28 DIAGNOSIS — I10 Essential (primary) hypertension: Secondary | ICD-10-CM

## 2021-11-28 DIAGNOSIS — E039 Hypothyroidism, unspecified: Secondary | ICD-10-CM

## 2021-11-28 DIAGNOSIS — D6869 Other thrombophilia: Secondary | ICD-10-CM

## 2021-11-28 DIAGNOSIS — I480 Paroxysmal atrial fibrillation: Secondary | ICD-10-CM

## 2021-11-28 MED ORDER — AMOXICILLIN 500 MG PO CAPS: ORAL_CAPSULE | ORAL | 4 refills | Status: DC

## 2021-11-28 NOTE — Assessment & Plan Note (Addendum)
Now Managed with low dose metoprolol 12.5 mg taken once daily .  Currently in sinus rhythm.  Continue warfarin, managed by Cardiology

## 2021-11-28 NOTE — Patient Instructions (Addendum)
Congratulations on the weight loss!  

## 2021-11-28 NOTE — Assessment & Plan Note (Signed)
I have congratulated her in reduction of   BMI and encouraged  Continued maintenance  using a low glycemic index diet and regular exercise a minimum of 5 days per week.

## 2021-11-28 NOTE — Assessment & Plan Note (Signed)
Secondary to hypertension.  She is on an ARB for control of hypertension,, and statin for control of hyperlipidemia. GFR is stable and she follows up with Dr Candiss Norse every 6 months   .   Lab Results  Component Value Date   CREATININE 1.31 (H) 05/30/2021

## 2021-11-28 NOTE — Assessment & Plan Note (Signed)
Well controlled on metoprolol amlodipine and telmisartan regimen. Renal function stable, no changes today.

## 2021-11-28 NOTE — Progress Notes (Signed)
Subjective:  Patient ID: Ana Cruz, female    DOB: 11/29/1933  Age: 88 y.o. MRN: [id number]  CC: The primary encounter diagnosis was Primary hypertension. Diagnoses of Acquired hypothyroidism, Pure hypercholesterolemia, Prediabetes, Chronic venous insufficiency, Stage 3b chronic kidney disease ([place]), Acquired thrombophilia ([place]), and Paroxysmal atrial fibrillation ([place]) were also pertinent to this visit.   HPI Ana Cruz presents for  Chief Complaint  Patient presents with   Follow-up    6 month follow up hypothyroidism, hyperlipidemia, hypertension   Cc:  right leg swelling for the past 6 weeks swollen by the end of ay,  skinny by morning.  No calf swelling ,  no car trips no surgery  Bilateral knee replacement  14 years ago .  Taking warfarin for   Intentional weight loss of 10 lbs by reducing portions  and walking daily  BMI now 27   Outpatient Medications Prior to Visit  Medication Sig Dispense Refill   acetaminophen (TYLENOL) 500 MG tablet Take 500 mg by mouth every 6 (six) hours as needed (As needed for pain).     amLODipine (NORVASC) 5 MG tablet TAKE 1 TABLET (5 MG TOTAL) BY MOUTH DAILY. 90 tablet 3   Artificial Tear Ointment (EYE LUBRICANT OP) Apply 1 drop to eye 2 (two) times daily.     Cholecalciferol (VITAMIN D3) 1000 UNITS CAPS Take 1,000 Units by mouth daily.     furosemide (LASIX) 20 MG tablet TAKE 1 TABLET (20 MG TOTAL) BY MOUTH DAILY. AS NEEDED FOR FLUID RETENTION 90 tablet 1   levothyroxine (SYNTHROID) 88 MCG tablet TAKE 1 TABLET BY MOUTH DAILY BEFORE BREAKFAST. 90 tablet 1   metoprolol tartrate (LOPRESSOR) 25 MG tablet Take 12.5 mg by mouth 2 (two) times daily.     pravastatin (PRAVACHOL) 40 MG tablet TAKE 1 TABLET BY MOUTH EVERY DAY 90 tablet 3   Probiotic Product (PROBIOTIC-10) CHEW Chew 1 tablet by mouth daily.     telmisartan (MICARDIS) 80 MG tablet Take 80 mg by mouth daily.     vitamin B-12 (CYANOCOBALAMIN) 1000 MCG tablet Take 1 tablet  (1,000 mcg total) by mouth daily. 90 tablet 3   vitamin E 180 MG (400 UNITS) capsule Take 800 Units by mouth daily.     warfarin (COUMADIN) 1 MG tablet Take 1 mg by mouth daily.      fluticasone (FLONASE) 50 MCG/ACT nasal spray Place 1 spray into both nostrils daily. 16 g 0   amoxicillin (AMOXIL) 500 MG capsule Take 500 mg by mouth. Take four tablets prior to dental work. (Patient not taking: Reported on 11/28/2021)     No facility-administered medications prior to visit.    Review of Systems;  Patient denies headache, fevers, malaise, unintentional weight loss, skin rash, eye pain, sinus congestion and sinus pain, sore throat, dysphagia,  hemoptysis , cough, dyspnea, wheezing, chest pain, palpitations, orthopnea, edema, abdominal pain, nausea, melena, diarrhea, constipation, flank pain, dysuria, hematuria, urinary  Frequency, nocturia, numbness, tingling, seizures,  Focal weakness, Loss of consciousness,  Tremor, insomnia, depression, anxiety, and suicidal ideation.      Objective:  BP 112/62 (BP Location: Left Arm, Patient Position: Sitting, Cuff Size: Large)   Pulse 62   Temp 97.6 F (36.4 C) (Oral)   Ht 5' 3" (1.6 m)   Wt 154 lb 3.2 oz (69.9 kg)   SpO2 97%   BMI 27.32 kg/m   BP Readings from Last 3 Encounters:  11/28/21 112/62  11/20/21 121/69  05/30/21 140/66  Wt Readings from Last 3 Encounters:  11/28/21 154 lb 3.2 oz (69.9 kg)  11/20/21 153 lb 12.8 oz (69.8 kg)  05/30/21 164 lb (74.4 kg)    General appearance: alert, cooperative and appears stated age Ears: normal TM's and external ear canals both ears Throat: lips, mucosa, and tongue normal; teeth and gums normal Neck: no adenopathy, no carotid bruit, supple, symmetrical, trachea midline and thyroid not enlarged, symmetric, no tenderness/mass/nodules Back: symmetric, no curvature. ROM normal. No CVA tenderness. Lungs: clear to auscultation bilaterally Heart: regular rate and rhythm, S1, S2 normal, no murmur,  click, rub or gallop Abdomen: soft, non-tender; bowel sounds normal; no masses,  no organomegaly Pulses: 2+ and symmetric Skin: Skin color, texture, turgor normal. No rashes or lesions Lymph nodes: Cervical, supraclavicular, and axillary nodes normal.  Lab Results  Component Value Date   HGBA1C 5.7 (H) 06/29/2021   HGBA1C 5.7 05/22/2020   HGBA1C 5.8 01/18/2020    Lab Results  Component Value Date   CREATININE 1.31 (H) 05/30/2021   CREATININE 1.19 11/20/2020   CREATININE 1.14 (H) 06/12/2020    Lab Results  Component Value Date   WBC 6.9 06/28/2021   HGB 11.4 (L) 06/28/2021   HCT 35.5 (L) 06/28/2021   PLT 224.0 06/28/2021   GLUCOSE 132 (H) 05/30/2021   CHOL 183 06/28/2021   TRIG 88.0 06/28/2021   HDL 61.40 06/28/2021   LDLDIRECT 107.0 03/14/2017   LDLCALC 104 (H) 06/28/2021   ALT 10 05/30/2021   AST 16 05/30/2021   NA 141 05/30/2021   K 4.6 05/30/2021   CL 107 05/30/2021   CREATININE 1.31 (H) 05/30/2021   BUN 46 (H) 05/30/2021   CO2 27 05/30/2021   TSH 0.57 06/28/2021   HGBA1C 5.7 (H) 06/29/2021    MM 3D SCREEN BREAST BILATERAL  Result Date: 08/28/2021 CLINICAL DATA:  Screening. EXAM: DIGITAL SCREENING BILATERAL MAMMOGRAM WITH TOMOSYNTHESIS AND CAD TECHNIQUE: Bilateral screening digital craniocaudal and mediolateral oblique mammograms were obtained. Bilateral screening digital breast tomosynthesis was performed. The images were evaluated with computer-aided detection. COMPARISON:  Previous exam(s). ACR Breast Density Category b: There are scattered areas of fibroglandular density. FINDINGS: There are no findings suspicious for malignancy. IMPRESSION: No mammographic evidence of malignancy. A result letter of this screening mammogram will be mailed directly to the patient. RECOMMENDATION: Screening mammogram in one year. (Code:SM-B-01Y) BI-RADS CATEGORY  1: Negative. Electronically Signed   By: Jennifer  Jarosz M.D.   On: 08/28/2021 13:45    Assessment & Plan:    Problem List Items Addressed This Visit     Acquired thrombophilia (HCC)    secondary to paroxysmal atrial fibrillation.  She is tolerating use of warfarin  for embolic stroke risk mitigation due to  atrial fibrillation. Patient has no signs of bleeding and is advised to notify her specialists prior to any procedure that may required suspension of warfarin       Acquired hypothyroidism    Thyroid function  Has been WNL on current dose of 88 mcg daily . Repeat level due    Lab Results  Component Value Date   TSH 0.57 06/28/2021         Relevant Orders   TSH   Hyperlipidemia   Relevant Orders   Lipid Profile   Direct LDL   Hypertension - Primary    Well controlled on metoprolol amlodipine and telmisartan regimen. Renal function stable, no changes today.      Relevant Orders   Comp Met (CMET)   Urine   Microalbumin w/creat. ratio   Paroxysmal atrial fibrillation (HCC)    Now Managed with low dose metoprolol 12.5 mg taken once daily .  Currently in sinus rhythm.  Continue warfarin, managed by Cardiology      Chronic venous insufficiency     I have reviewed my previous discussion with the patient regarding swelling and why it causes symptoms.  Patient will continue wearing graduated compression stockings class 1 (20-30 mmHg) on a daily basis. The patient will  beginning wearing the stockings first thing in the morning and removing them in the evening. The patient is instructed specifically not to sleep in the stockings.     In addition, behavioral modification including several periods of elevation of the lower extremities during the day will be continued.  This was reviewed with the patient during the initial visit.   The patient will also continue routine exercise, especially walking on a daily basis as was discussed during the initial visit.        CKD (chronic kidney disease) stage 3, GFR 30-59 ml/min (HCC)    Secondary to hypertension.  She is on an ARB for control of  hypertension,, and statin for control of hyperlipidemia. GFR is stable and she follows up with Dr Singh every 6 months   .   Lab Results  Component Value Date   CREATININE 1.31 (H) 05/30/2021         Prediabetes   Relevant Orders   HgB A1c   Comp Met (CMET)   Urine Microalbumin w/creat. ratio    I spent a total of  27 minutes with this patient in a face to face visit on the date of this encounter reviewing the last office visit with me ,  most recent with patient's cardiologist ,    patient's diet and eating habits, home blood pressure readings ,  most recent imaging study ,   and post visit ordering of testing and therapeutics.    Follow-up: Return in about 6 months (around 05/30/2022).   Teresa L Tullo, MD 

## 2021-11-28 NOTE — Assessment & Plan Note (Signed)
Thyroid function  Has been WNL on current dose of 88 mcg daily . Repeat level due    Lab Results  Component Value Date   TSH 0.57 06/28/2021

## 2021-11-28 NOTE — Assessment & Plan Note (Signed)
I have reviewed my previous discussion with the patient regarding swelling and why it causes symptoms.  Patient will continue wearing graduated compression stockings class 1 (20-30 mmHg) on a daily basis. The patient will  beginning wearing the stockings first thing in the morning and removing them in the evening. The patient is instructed specifically not to sleep in the stockings.     In addition, behavioral modification including several periods of elevation of the lower extremities during the day will be continued.  This was reviewed with the patient during the initial visit.   The patient will also continue routine exercise, especially walking on a daily basis as was discussed during the initial visit.

## 2021-11-28 NOTE — Assessment & Plan Note (Signed)
secondary to paroxysmal atrial fibrillation.  She is tolerating use of warfarin  for embolic stroke risk mitigation due to  atrial fibrillation. Patient has no signs of bleeding and is advised to notify her specialists prior to any procedure that may required suspension of warfarin  

## 2021-11-29 LAB — LIPID PANEL
Cholesterol: 179 mg/dL (ref 0–200)
HDL: 60.8 mg/dL (ref >39.00)
LDL Cholesterol: 93 mg/dL (ref 0–99)
NonHDL: 118.11
Total CHOL/HDL Ratio: 3
Triglycerides: 125 mg/dL (ref 0.0–149.0)
VLDL: 25 mg/dL (ref 0.0–40.0)

## 2021-11-29 LAB — COMPREHENSIVE METABOLIC PANEL
ALT: 9 U/L (ref 0–35)
AST: 15 U/L (ref 0–37)
Albumin: 3.9 g/dL (ref 3.5–5.2)
Alkaline Phosphatase: 76 U/L (ref 39–117)
BUN: 56 mg/dL — ABNORMAL HIGH (ref 6–23)
CO2: 26 mEq/L (ref 19–32)
Calcium: 9.2 mg/dL (ref 8.4–10.5)
Chloride: 105 mEq/L (ref 96–112)
Creatinine, Ser: 1.64 mg/dL — ABNORMAL HIGH (ref 0.40–1.20)
GFR: 28.2 mL/min — ABNORMAL LOW (ref >60.00)
Glucose, Bld: 96 mg/dL (ref 70–99)
Potassium: 4.8 mEq/L (ref 3.5–5.1)
Sodium: 138 mEq/L (ref 135–145)
Total Bilirubin: 0.3 mg/dL (ref 0.2–1.2)
Total Protein: 6.7 g/dL (ref 6.0–8.3)

## 2021-11-29 LAB — LDL CHOLESTEROL, DIRECT: Direct LDL: 94 mg/dL

## 2021-11-29 LAB — HEMOGLOBIN A1C: Hgb A1c MFr Bld: 6 % (ref 4.6–6.5)

## 2021-11-29 LAB — TSH: TSH: 0.16 u[IU]/mL — ABNORMAL LOW (ref 0.35–5.50)

## 2021-11-30 ENCOUNTER — Other Ambulatory Visit: Payer: Self-pay | Admitting: Internal Medicine

## 2021-11-30 IMAGING — CT CT HEAD W/O CM
3 series · 15 of 47 positions shown, 18 images · non-contrast
Comparison: 12/04/2017

CLINICAL DATA: Hypertension.  Lightheadedness.

EXAM:
CT HEAD WITHOUT CONTRAST
TECHNIQUE: Contiguous axial images were obtained from the base of the skull
through the vertex without intravenous contrast.

[Series 2: head wo · axial · 0.43mm/px · z∈[+159,+284]mm · 9 of 30 slices shown, 12 images]
[im 3/30  brain]
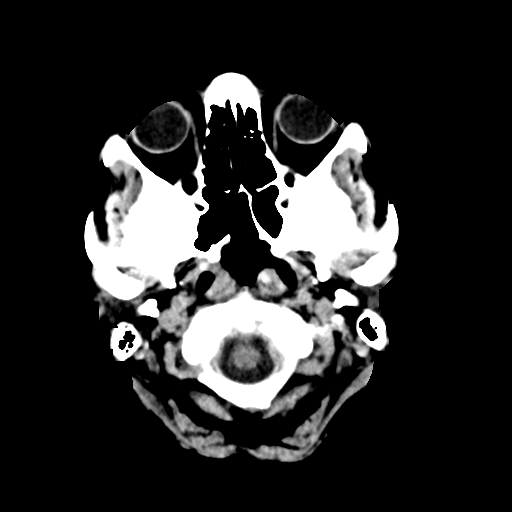
[im 3/30  bone]
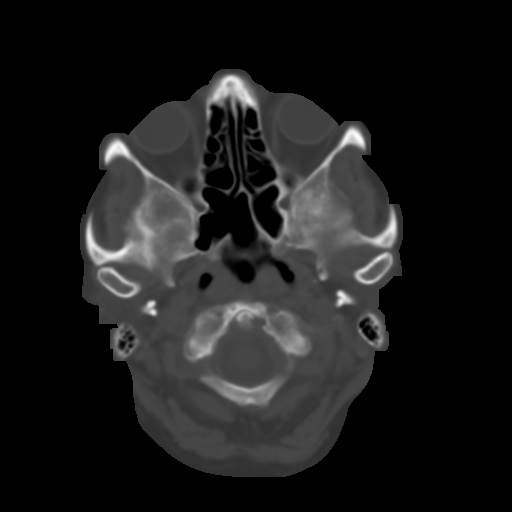
[im 6/30  brain]
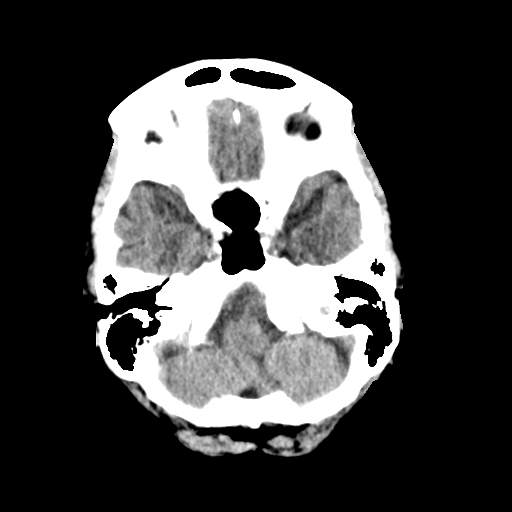
[im 9/30  brain]
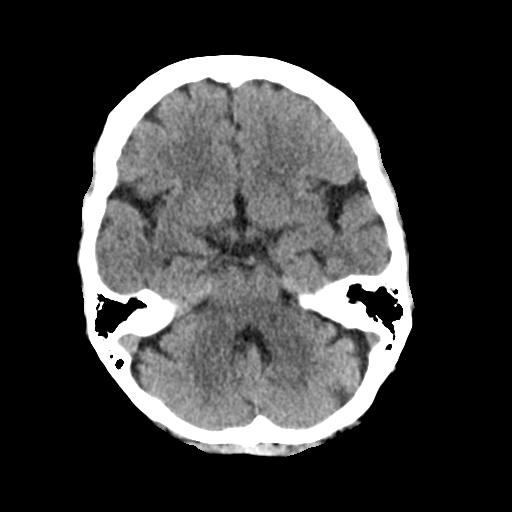
[im 12/30  brain]
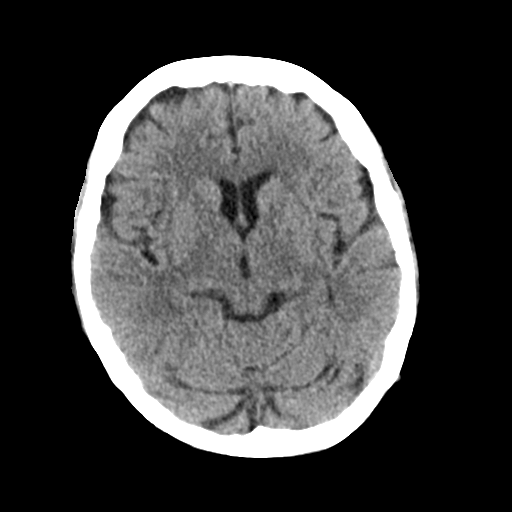
[im 16/30  brain]
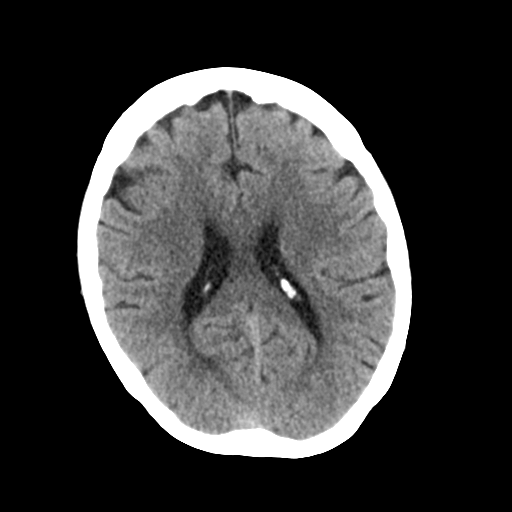
[im 16/30  bone]
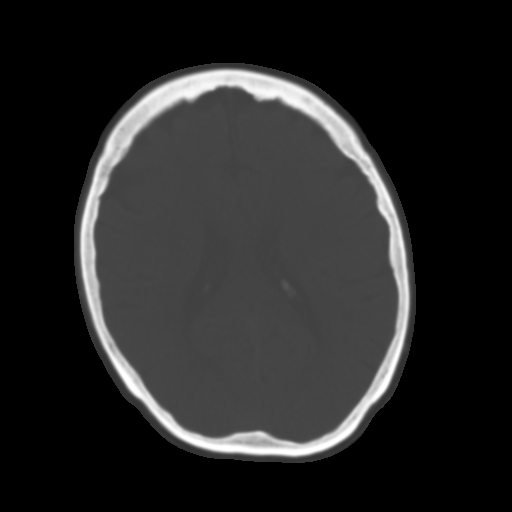
[im 19/30  brain]
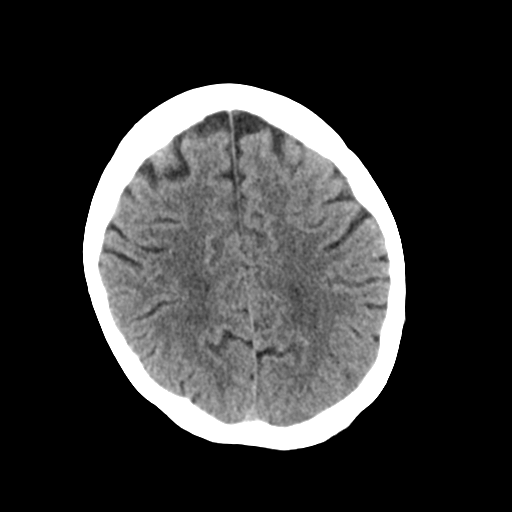
[im 22/30  brain]
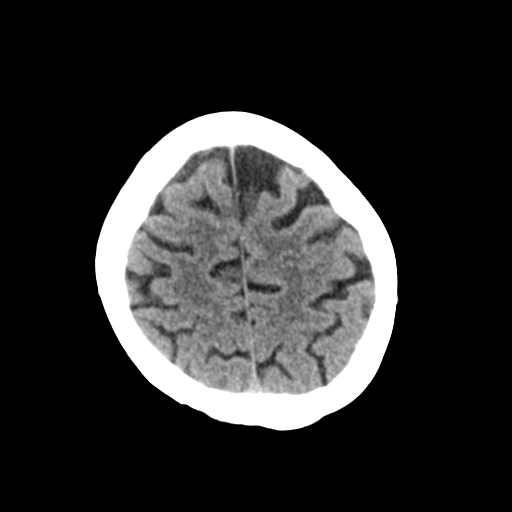
[im 25/30  brain]
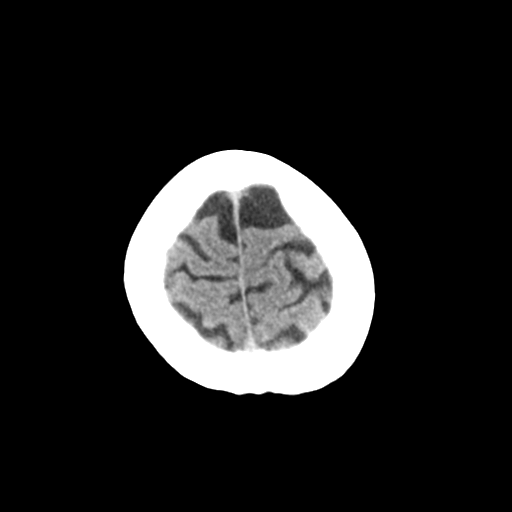
[im 28/30  brain]
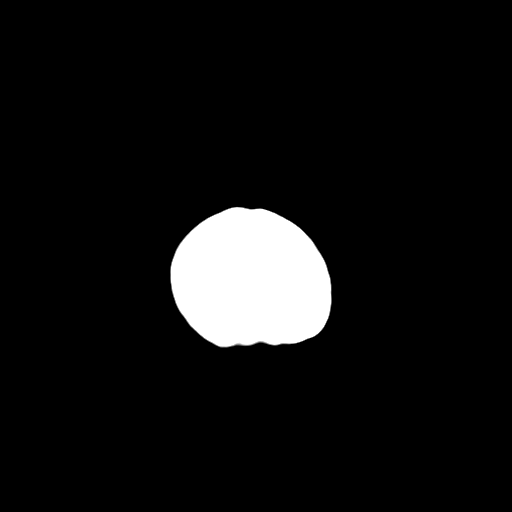
[im 28/30  bone]
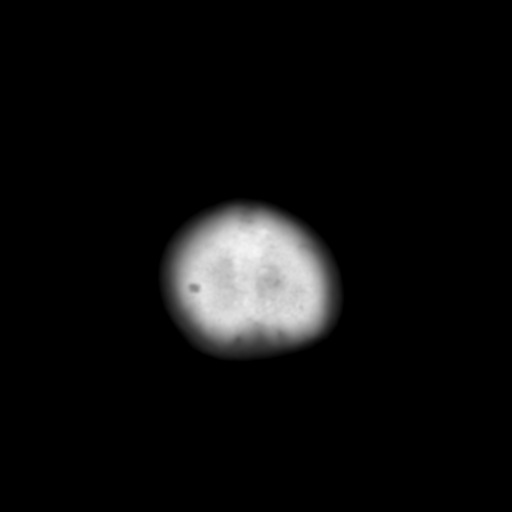

[Series 4: coronal soft tissue · coronal · 0.30mm/px · 3 of 57 slices shown]
[im 19/57  brain]
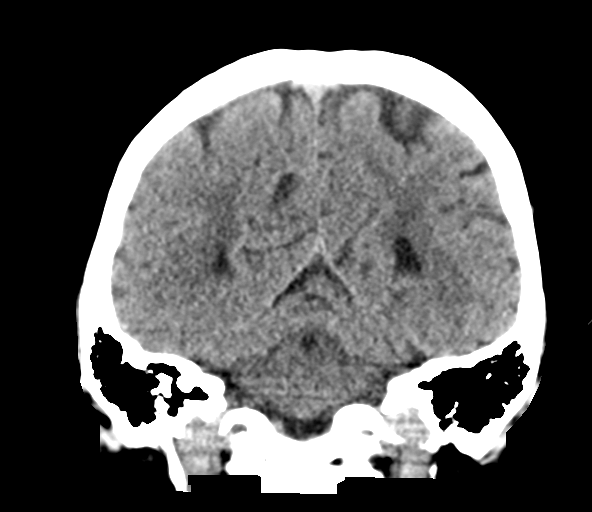
[im 25/57  brain]
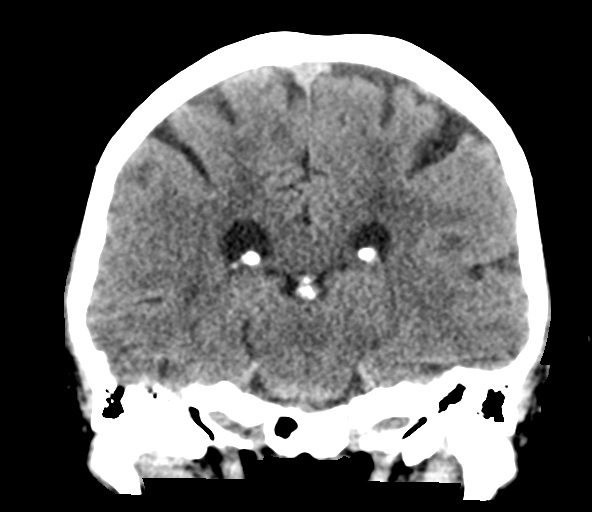
[im 32/57  brain]
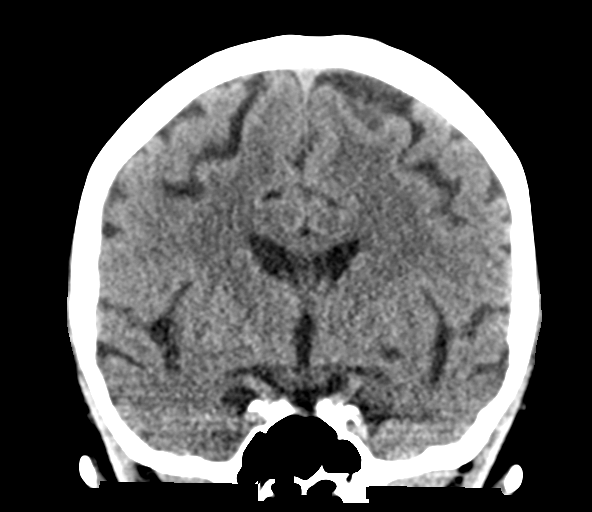

[Series 5: sagittal soft tissue · sagittal · 0.29mm/px · 3 of 51 slices shown]
[im 17/51  brain]
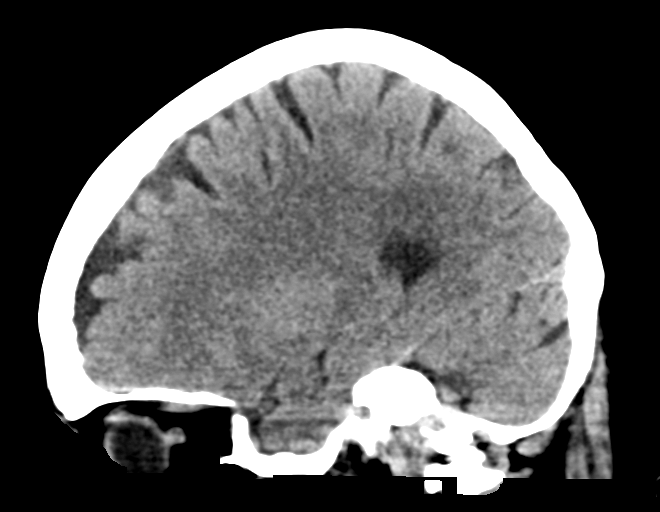
[im 26/51  brain]
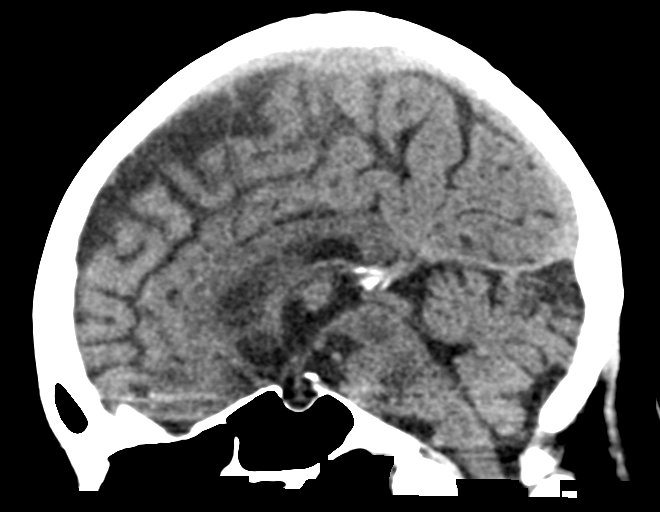
[im 34/51  brain]
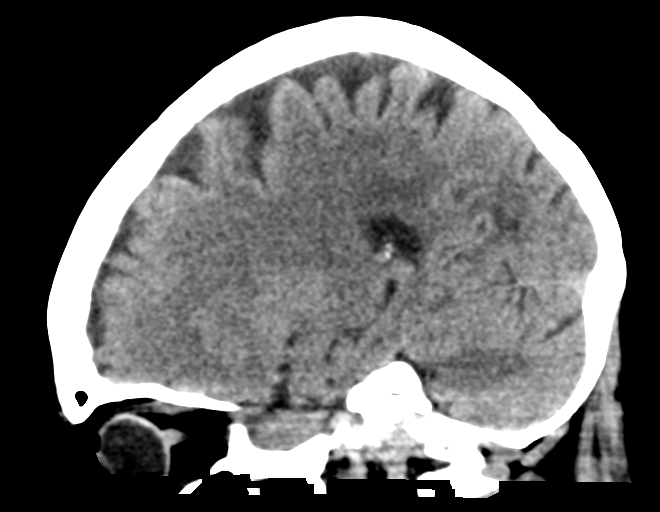

[15 of 47 positions shown; findings below may reference images not displayed]

FINDINGS: Brain: There is no mass, hemorrhage or extra-axial collection. The
size and configuration of the ventricles and extra-axial CSF spaces
are normal. The brain parenchyma is normal, without acute or chronic
infarction.

Vascular: No abnormal hyperdensity of the major intracranial
arteries or dural venous sinuses. No intracranial atherosclerosis.

Skull: The visualized skull base, calvarium and extracranial soft
tissues are normal.

Sinuses/Orbits: No fluid levels or advanced mucosal thickening of
the visualized paranasal sinuses. No mastoid or middle ear effusion.
The orbits are normal.
IMPRESSION: Normal head CT.

## 2021-11-30 MED ORDER — LEVOTHYROXINE SODIUM 75 MCG PO TABS: 75.0000 ug | ORAL_TABLET | Freq: Every day | ORAL | 1 refills | Status: DC

## 2021-11-30 NOTE — Addendum Note (Signed)
Addended by: Leeanne Rio on: 11/30/2021 10:39 AM   Modules accepted: Orders

## 2021-12-17 ENCOUNTER — Other Ambulatory Visit: Payer: Medicare Other

## 2021-12-17 DIAGNOSIS — R7303 Prediabetes: Secondary | ICD-10-CM

## 2021-12-17 DIAGNOSIS — I10 Essential (primary) hypertension: Secondary | ICD-10-CM

## 2021-12-18 LAB — MICROALBUMIN / CREATININE URINE RATIO
Creatinine,U: 77.2 mg/dL
Microalb Creat Ratio: 0.9 mg/g (ref 0.0–30.0)
Microalb, Ur: <0.7 mg/dL (ref 0.0–1.9)

## 2022-01-07 ENCOUNTER — Encounter: Payer: Self-pay | Admitting: Internal Medicine

## 2022-01-10 ENCOUNTER — Telehealth: Payer: Self-pay | Admitting: Internal Medicine

## 2022-01-10 ENCOUNTER — Other Ambulatory Visit: Payer: Self-pay

## 2022-01-10 DIAGNOSIS — E039 Hypothyroidism, unspecified: Secondary | ICD-10-CM

## 2022-01-10 NOTE — Telephone Encounter (Signed)
TSH ordered.

## 2022-01-10 NOTE — Telephone Encounter (Signed)
Patient has a lab appt 01/14/22, there are no orders in.

## 2022-01-14 ENCOUNTER — Other Ambulatory Visit (INDEPENDENT_AMBULATORY_CARE_PROVIDER_SITE_OTHER): Payer: Medicare Other

## 2022-01-14 DIAGNOSIS — E039 Hypothyroidism, unspecified: Secondary | ICD-10-CM

## 2022-01-14 LAB — TSH: TSH: 1.6 u[IU]/mL (ref 0.35–5.50)

## 2022-01-16 NOTE — Assessment & Plan Note (Addendum)
Thyroid function is WNL on current dose of 75 mcg daily .  No current changes needed.   Lab Results  Component Value Date   TSH 1.60 01/14/2022

## 2022-02-08 ENCOUNTER — Ambulatory Visit: Payer: Self-pay | Admitting: *Deleted

## 2022-02-08 NOTE — Patient Outreach (Signed)
  Care Coordination   Initial Visit Note   02/08/2022 Name: Ana Cruz MRN: [id number] DOB: [birth year]  Ana Cruz is a 88 y.o. year old female who sees Derrel Nip, Aris Everts, MD for primary care. I spoke with  Ana Cruz by phone today.  What matters to the patients health and wellness today?  Patient would like to discuss case management services with her provider during next appointment scheduled for 05/30/22    Goals Addressed             This Visit's Progress    care management activities       Care Coordination Interventions: Care Management program discussed and offered Patient appreciated the call, however would like to discuss program offerings with her provider Patient scheduled with PCP on 05/30/22 AWV confirmed 11/22/22           SDOH assessments and interventions completed:  No     Care Coordination Interventions Activated:  Yes  Care Coordination Interventions:  Yes, provided   Follow up plan:  patient would like to discuss care management program with provider before consenting    Encounter Outcome:  Pt. Visit Completed

## 2022-02-08 NOTE — Patient Instructions (Signed)
Visit Information  Thank you for taking time to visit with me today. Please don't hesitate to contact me if I can be of assistance to you.   Following are the goals we discussed today:   Goals Addressed             This Visit's Progress    care management activities       Care Coordination Interventions: Care Management program discussed and offered Patient appreciated the call, however would like to discuss program offerings with her provider Patient scheduled with PCP on 05/30/22 AWV confirmed 11/22/22            Please call the care guide team at [phone] if you need to cancel or reschedule your appointment.   If you are experiencing a Mental Health or [place] or need someone to talk to, please call 911   Patient verbalizes understanding of instructions and care plan provided today and agrees to view in [facility]. Active MyChart status and patient understanding of how to access instructions and care plan via MyChart confirmed with patient.     Next PCP appointment scheduled for:  05/30/22 Next AWV (Annual Wellness Visit) scheduled for: 11/22/22  Elliot Gurney, [place] Worker  [facility] [phone]

## 2022-02-17 ENCOUNTER — Other Ambulatory Visit: Payer: Self-pay | Admitting: Internal Medicine

## 2022-02-17 DIAGNOSIS — E039 Hypothyroidism, unspecified: Secondary | ICD-10-CM

## 2022-02-18 NOTE — Telephone Encounter (Signed)
prescription was discontinued on 11/30/2021 by Crecencio Mc, MD

## 2022-04-02 ENCOUNTER — Encounter: Payer: Self-pay | Admitting: Family Medicine

## 2022-04-02 ENCOUNTER — Telehealth (INDEPENDENT_AMBULATORY_CARE_PROVIDER_SITE_OTHER): Payer: Medicare Other | Admitting: Family Medicine

## 2022-04-02 ENCOUNTER — Encounter: Payer: Self-pay | Admitting: Internal Medicine

## 2022-04-02 VITALS — Temp 97.4°F

## 2022-04-02 DIAGNOSIS — U071 COVID-19: Secondary | ICD-10-CM | POA: Diagnosis not present

## 2022-04-02 MED ORDER — MOLNUPIRAVIR EUA 200MG CAPSULE: 4.0000 | ORAL_CAPSULE | Freq: Two times a day (BID) | ORAL | 0 refills | Status: AC

## 2022-04-02 NOTE — Telephone Encounter (Signed)
LMTCB. Need to schedule pt a virtual visit to discuss treatment options.

## 2022-04-02 NOTE — Telephone Encounter (Signed)
Pt already has an appointment today with walsh at 4

## 2022-04-02 NOTE — Progress Notes (Signed)
[facility] Telemedicine Visit  Patient consented to have virtual visit and was identified by name and date of birth. Method of visit: Video  Encounter participants: Patient: Ana Cruz - located at home Provider: Carollee Leitz - located at office Others (if applicable): none  Chief Complaint: Scratchy throat  HPI:  Symptoms of scratchy throat started 10/30.  Endorses increased fatigue.  For her increased sore throat she has been taking Hall's cough drops which  has helped.  Denies any fever, shortness of breath, chest pain or difficulty breathing.  No recent sick contacts.  Home COVID test +10/31.  ROS: per HPI  Pertinent PMHx:  Mention Paroxysmal A-fib, warfarin Hypothyroidism  Exam:  Temp (!) 97.4 F (36.3 C) (Oral)   Respiratory: Speaking in full sentences.  No increased work of breathing or cough during video visit  Assessment/Plan:  Positive self-administered antigen test for [place] test positive.  In no acute respiratory distress.  Would like to continue with antiviral therapy. -Start Molnupiravir 200 mg, take 4 capsules 2 times a day for 5 days -Symptomatic management for fever, muscle aches and headaches  -Tylenol 325-500 mg every 6 hours as needed -Cepacol lozenges for sore throat -Honey teas -Stay well hydrated -Rest as needed with frequent repositioning and ambulation.  Increase activity as soon as tolerated to help with recovery. -Continue wearing masks, hand washing and self isolation until symptom free. -Follow-up with PCP as scheduled or if symptoms worsen -Return precautions provided   PDMP reviewed

## 2022-04-02 NOTE — Telephone Encounter (Signed)
noted 

## 2022-04-02 NOTE — Patient Instructions (Signed)
It was a pleasure meeting you today. Thank you for allowing me to take part in your health care.  Our goals for today as we discussed include:  For your COVID symptoms Start Molnupiravir 200 mg, take 4 capsules 2 times a day for 5 days Symptomatic management for fever, muscle aches and headaches  -Tylenol 325-500 mg every 6 hours as needed -Cepacol lozenges for sore throat -Honey teas  Stay well hydrated Rest as needed with frequent repositioning and ambulation.  Increase activity as soon as tolerated to help with recovery.  Continue wearing masks, hand washing and self isolation until symptom free.  If you have worsening symptoms, especially difficulty breathing please call 911 or have someone take you to the emergency department.    Please follow-up with PCP as scheduled.  Please send a MyChart message in a day or two to let me know how you are doing.  If you have any questions or concerns, please do not hesitate to call the office at [phone].  I look forward to our next visit and until then take care and stay safe.  Regards,   Carollee Leitz, MD   [place]    Molnupiravir Capsules What is this medication? MOLNUPIRAVIR (MOL nue PIR a vir) treats mild to moderate COVID-19. It may help people who are at high risk of developing severe illness. This medication works by limiting the spread of the virus in your body. The FDA has allowed the emergency use of this medication. This medicine may be used for other purposes; ask your health care provider or pharmacist if you have questions. COMMON BRAND NAME(S): LAGEVRIO What should I tell my care team before I take this medication? They need to know if you have any of these conditions: Any allergies Any serious illness An unusual or allergic reaction to molnupiravir, other medications, foods, dyes, or preservatives Pregnant or trying to get pregnant Breast-feeding How should I use this medication? Take this  medication by mouth with water. Take it as directed on the prescription label at the same time every day. Do not cut, crush, or chew this medication. Swallow the capsules whole. You can take it with or without food. If it upsets your stomach, take it with food. Take all of it unless your care team tells you to stop it early. Keep taking it even if you think you are better. Talk to your care team about the use of this medication in children. Special care may be needed. Overdosage: If you think you have taken too much of this medicine contact a poison control center or emergency room at once. NOTE: This medicine is only for you. Do not share this medicine with others. What if I miss a dose? If you miss a dose, take it as soon as you can unless it is more than 10 hours late. If it is more than 10 hours late, skip the missed dose. Take the next dose at the normal time. Do not take extra or 2 doses at the same time to make up for the missed dose. What may interact with this medication? Interactions have not been studied. This list may not describe all possible interactions. Give your health care provider a list of all the medicines, herbs, non-prescription drugs, or dietary supplements you use. Also tell them if you smoke, drink alcohol, or use illegal drugs. Some items may interact with your medicine. What should I watch for while using this medication? Your condition will be monitored carefully  while you are receiving this medication. Visit your care team for regular checkups. Tell your care team if your symptoms do not start to get better or if they get worse. Do not become pregnant while taking this medication. You may need a pregnancy test before starting this medication. Women must use a reliable form of birth control while taking this medication and for 4 days after stopping the medication. Women should inform their care team if they wish to become pregnant or think they might be pregnant. Men should  not father a child while taking this medication and for 3 months after stopping it. There is potential for serious harm to an unborn child. Talk to your care team for more information. Do not breast-feed an infant while taking this medication and for 4 days after stopping the medication. What side effects may I notice from receiving this medication? Side effects that you should report to your care team as soon as possible: Allergic reactions--skin rash, itching, hives, swelling of the face, lips, tongue, or throat Side effects that usually do not require medical attention (report these to your care team if they continue or are bothersome): Diarrhea Dizziness Nausea This list may not describe all possible side effects. Call your doctor for medical advice about side effects. You may report side effects to FDA at 1-800-FDA-1088. Where should I keep my medication? Keep out of the reach of children and pets. Store at room temperature between 20 and 25 degrees C (68 and 77 degrees F). Get rid of any unused medication after the expiration date. To get rid of medications that are no longer needed or have expired: Take the medication to a medication take-back program. Check with your pharmacy or law enforcement to find a location. If you cannot return the medication, check the label or package insert to see if the medication should be thrown out in the garbage or flushed down the toilet. If you are not sure, ask your care team. If it is safe to put it in the trash, take the medication out of the container. Mix the medication with cat litter, dirt, coffee grounds, or other unwanted substance. Seal the mixture in a bag or container. Put it in the trash. NOTE: This sheet is a summary. It may not cover all possible information. If you have questions about this medicine, talk to your doctor, pharmacist, or health care provider.  2023 Elsevier/Gold Standard (2020-05-29 00:00:00)

## 2022-04-13 ENCOUNTER — Other Ambulatory Visit: Payer: Self-pay | Admitting: Internal Medicine

## 2022-04-16 ENCOUNTER — Encounter: Payer: Self-pay | Admitting: Family Medicine

## 2022-04-16 DIAGNOSIS — U071 COVID-19: Secondary | ICD-10-CM | POA: Insufficient documentation

## 2022-04-16 NOTE — Assessment & Plan Note (Signed)
Home COVID test positive.  In no acute respiratory distress.  Would like to continue with antiviral therapy. -Start Molnupiravir 200 mg, take 4 capsules 2 times a day for 5 days -Symptomatic management for fever, muscle aches and headaches  -Tylenol 325-500 mg every 6 hours as needed -Cepacol lozenges for sore throat -Honey teas -Stay well hydrated -Rest as needed with frequent repositioning and ambulation.  Increase activity as soon as tolerated to help with recovery. -Continue wearing masks, hand washing and self isolation until symptom free. -Follow-up with PCP as scheduled or if symptoms worsen -Return precautions provided

## 2022-05-15 NOTE — Telephone Encounter (Signed)
MyChart messgae sent to patient. 

## 2022-05-15 NOTE — Telephone Encounter (Signed)
Error

## 2022-05-17 ENCOUNTER — Other Ambulatory Visit: Payer: Self-pay | Admitting: Internal Medicine

## 2022-05-30 ENCOUNTER — Encounter: Payer: Self-pay | Admitting: Internal Medicine

## 2022-05-30 ENCOUNTER — Ambulatory Visit (INDEPENDENT_AMBULATORY_CARE_PROVIDER_SITE_OTHER): Payer: Medicare Other | Admitting: Internal Medicine

## 2022-05-30 VITALS — BP 118/80 | HR 68 | Temp 97.8°F | Ht 63.0 in | Wt 147.0 lb

## 2022-05-30 DIAGNOSIS — I480 Paroxysmal atrial fibrillation: Secondary | ICD-10-CM

## 2022-05-30 DIAGNOSIS — I10 Essential (primary) hypertension: Secondary | ICD-10-CM

## 2022-05-30 DIAGNOSIS — E7800 Pure hypercholesterolemia, unspecified: Secondary | ICD-10-CM

## 2022-05-30 DIAGNOSIS — F4321 Adjustment disorder with depressed mood: Secondary | ICD-10-CM

## 2022-05-30 DIAGNOSIS — D6869 Other thrombophilia: Secondary | ICD-10-CM

## 2022-05-30 LAB — COMPREHENSIVE METABOLIC PANEL
ALT: 10 U/L (ref 0–35)
AST: 18 U/L (ref 0–37)
Albumin: 3.7 g/dL (ref 3.5–5.2)
Alkaline Phosphatase: 71 U/L (ref 39–117)
BUN: 39 mg/dL — ABNORMAL HIGH (ref 6–23)
CO2: 28 mEq/L (ref 19–32)
Calcium: 9 mg/dL (ref 8.4–10.5)
Chloride: 106 mEq/L (ref 96–112)
Creatinine, Ser: 1.08 mg/dL (ref 0.40–1.20)
GFR: 46.39 mL/min — ABNORMAL LOW (ref >60.00)
Glucose, Bld: 95 mg/dL (ref 70–99)
Potassium: 4.2 mEq/L (ref 3.5–5.1)
Sodium: 139 mEq/L (ref 135–145)
Total Bilirubin: 0.6 mg/dL (ref 0.2–1.2)
Total Protein: 6.6 g/dL (ref 6.0–8.3)

## 2022-05-30 LAB — LIPID PANEL
Cholesterol: 206 mg/dL — ABNORMAL HIGH (ref 0–200)
HDL: 64.4 mg/dL (ref >39.00)
LDL Cholesterol: 127 mg/dL — ABNORMAL HIGH (ref 0–99)
NonHDL: 141.99
Total CHOL/HDL Ratio: 3
Triglycerides: 74 mg/dL (ref 0.0–149.0)
VLDL: 14.8 mg/dL (ref 0.0–40.0)

## 2022-05-30 LAB — LDL CHOLESTEROL, DIRECT: Direct LDL: 114 mg/dL

## 2022-05-30 NOTE — Patient Instructions (Signed)
I'm so sorry for your loss.  I'm glad you and Ana Cruz have so many attentive children and family members to provide comfort.  I'd like to see you again in March (after your family leaves)  No medication changes today

## 2022-05-30 NOTE — Progress Notes (Signed)
Subjective:  Patient ID: Ana Cruz, female    DOB: Jun 26, 1933  Age: 88 y.o. MRN: [id number]  CC: The primary encounter diagnosis was Pure hypercholesterolemia. Diagnoses of Grief reaction, Paroxysmal atrial fibrillation ([place]), Acquired thrombophilia ([place]), and Primary hypertension were also pertinent to this visit.   HPI Ana Cruz presents for  Chief Complaint  Patient presents with   Medical Management of Chronic Issues   1) Grief:  she was Widowed last month, husband Iona Beard passed from metastatic lung CA,  found incidentally during pacemaker implantation  in 01-22-2023. .  Entered Hospice, died at home.  Spent his final months at home starting in October and sons /daughters have been present for the last several months of his life.   Patient was infected with COVID in October. Treated by Dr Volanda Napoleon with [facility]  .Oldest son lives in [place]  recently  diagnosed with prostate CA  but her daughters are staying close by until March.  Daughter in [place] and other son in [place]. [facility] weeks [facility] .  She is waking up early ,  but able to return to  sleep after making a list. Has lost 10 lbs since June a total of 20 since  Dec 2022. Weight has plateaued , daughter is doing the cooking   2) HTN:  taking telmisartan,  amlodipine and metoprolol . Pulse is < 60 but she Is asymptomatic  .  Scheduled to see kowalski's NP in March Petra Kuba )  3) Unintentional weight loss (see above).   4) acquired thrombophilia: secondary to  PAF .  She is  taking coumadin.  1 mg alt with 2 mg daily  for goal INR 2 to 3      Outpatient Medications Prior to Visit  Medication Sig Dispense Refill   acetaminophen (TYLENOL) 500 MG tablet Take 500 mg by mouth every 6 (six) hours as needed (As needed for pain).     amLODipine (NORVASC) 5 MG tablet TAKE 1 TABLET (5 MG TOTAL) BY MOUTH DAILY. 90 tablet 3   Artificial Tear Ointment (EYE LUBRICANT OP) Apply 1 drop to eye 2 (two) times  daily.     Cholecalciferol (VITAMIN D3) 1000 UNITS CAPS Take 1,000 Units by mouth daily.     levothyroxine (SYNTHROID) 75 MCG tablet TAKE 1 TABLET BY MOUTH EVERY DAY 90 tablet 1   metoprolol tartrate (LOPRESSOR) 25 MG tablet Take 12.5 mg by mouth 2 (two) times daily.     Probiotic Product (PROBIOTIC-10) CHEW Chew 1 tablet by mouth daily.     telmisartan (MICARDIS) 80 MG tablet Take 80 mg by mouth daily.     vitamin B-12 (CYANOCOBALAMIN) 1000 MCG tablet Take 1 tablet (1,000 mcg total) by mouth daily. 90 tablet 3   vitamin E 180 MG (400 UNITS) capsule Take 800 Units by mouth daily.     warfarin (COUMADIN) 1 MG tablet Take 1 mg by mouth daily. Patient takes 1 mg 4 x a week then 2 mg on the other 3 days of the week and gets checked regularly     pravastatin (PRAVACHOL) 40 MG tablet TAKE 1 TABLET BY MOUTH EVERY DAY 90 tablet 3   fluticasone (FLONASE) 50 MCG/ACT nasal spray Place 1 spray into both nostrils daily. 16 g 0   No facility-administered medications prior to visit.    Review of Systems;  Patient denies headache, fevers, malaise, unintentional weight loss, skin rash, eye pain, sinus congestion and sinus pain,  sore throat, dysphagia,  hemoptysis , cough, dyspnea, wheezing, chest pain, palpitations, orthopnea, edema, abdominal pain, nausea, melena, diarrhea, constipation, flank pain, dysuria, hematuria, urinary  Frequency, nocturia, numbness, tingling, seizures,  Focal weakness, Loss of consciousness,  Tremor, insomnia, depression, anxiety, and suicidal ideation.      Objective:  BP 118/80   Pulse 68   Temp 97.8 F (36.6 C) (Oral)   Ht '5\' 3"'$  (1.6 m)   Wt 147 lb (66.7 kg)   SpO2 97%   BMI 26.04 kg/m   BP Readings from Last 3 Encounters:  05/30/22 118/80  11/28/21 112/62  11/20/21 121/69    Wt Readings from Last 3 Encounters:  05/30/22 147 lb (66.7 kg)  11/28/21 154 lb 3.2 oz (69.9 kg)  11/20/21 153 lb 12.8 oz (69.8 kg)    Physical Exam Vitals reviewed.  Constitutional:       General: She is not in acute distress.    Appearance: Normal appearance. She is normal weight. She is not ill-appearing, toxic-appearing or diaphoretic.  HENT:     Head: Normocephalic.  Eyes:     General: No scleral icterus.       Right eye: No discharge.        Left eye: No discharge.     Conjunctiva/sclera: Conjunctivae normal.  Cardiovascular:     Rate and Rhythm: Normal rate and regular rhythm.     Heart sounds: Normal heart sounds.  Pulmonary:     Effort: Pulmonary effort is normal. No respiratory distress.     Breath sounds: Normal breath sounds.  Musculoskeletal:        General: Normal range of motion.  Skin:    General: Skin is warm and dry.  Neurological:     General: No focal deficit present.     Mental Status: She is alert and oriented to person, place, and time. Mental status is at baseline.  Psychiatric:        Mood and Affect: Mood normal.        Behavior: Behavior normal.        Thought Content: Thought content normal.        Judgment: Judgment normal.     Lab Results  Component Value Date   HGBA1C 6.0 11/28/2021   HGBA1C 5.7 (H) 06/29/2021   HGBA1C 5.7 05/22/2020    Lab Results  Component Value Date   CREATININE 1.08 05/30/2022   CREATININE 1.64 (H) 11/28/2021   CREATININE 1.31 (H) 05/30/2021    Lab Results  Component Value Date   WBC 6.9 06/28/2021   HGB 11.4 (L) 06/28/2021   HCT 35.5 (L) 06/28/2021   PLT 224.0 06/28/2021   GLUCOSE 95 05/30/2022   CHOL 206 (H) 05/30/2022   TRIG 74.0 05/30/2022   HDL 64.40 05/30/2022   LDLDIRECT 114.0 05/30/2022   LDLCALC 127 (H) 05/30/2022   ALT 10 05/30/2022   AST 18 05/30/2022   NA 139 05/30/2022   K 4.2 05/30/2022   CL 106 05/30/2022   CREATININE 1.08 05/30/2022   BUN 39 (H) 05/30/2022   CO2 28 05/30/2022   TSH 1.60 01/14/2022   HGBA1C 6.0 11/28/2021   MICROALBUR <0.7 12/17/2021    MM 3D SCREEN BREAST BILATERAL  Result Date: 08/28/2021 CLINICAL DATA:  Screening. EXAM: DIGITAL SCREENING  BILATERAL MAMMOGRAM WITH TOMOSYNTHESIS AND CAD TECHNIQUE: Bilateral screening digital craniocaudal and mediolateral oblique mammograms were obtained. Bilateral screening digital breast tomosynthesis was performed. The images were evaluated with computer-aided detection. COMPARISON:  Previous exam(s). ACR Breast Density  Category b: There are scattered areas of fibroglandular density. FINDINGS: There are no findings suspicious for malignancy. IMPRESSION: No mammographic evidence of malignancy. A result letter of this screening mammogram will be mailed directly to the patient. RECOMMENDATION: Screening mammogram in one year. (Code:SM-B-01Y) BI-RADS CATEGORY  1: Negative. Electronically Signed   By: Everlean Alstrom M.D.   On: 08/28/2021 13:45    Assessment & Plan:  .Pure hypercholesterolemia Assessment & Plan:  Goal LDL is < 70 given atherosclerotic disease noted on brain MRi's.  Changing to high potency statin  Lab Results  Component Value Date   CHOL 206 (H) 05/30/2022   HDL 64.40 05/30/2022   LDLCALC 127 (H) 05/30/2022   LDLDIRECT 114.0 05/30/2022   TRIG 74.0 05/30/2022   CHOLHDL 3 05/30/2022    Orders: -     Comprehensive metabolic panel -     Lipid panel -     LDL cholesterol, direct  Grief reaction Assessment & Plan: Secondary to loss of husband Iona Beard one month ago. Patient is dealing with the unexpected loss of spouse and has adequate coping skills and emotional support  until March,  when her children return home .  i have asked patient  to return in 3 months to examine for signs of unresolving grief.     Paroxysmal atrial fibrillation [facility]) Assessment & Plan: Episodes are rare .  She is anticoagulated   and rate is slow on metoprolol last inr was 1.9 dec 6      Acquired thrombophilia ([place]) Assessment & Plan: secondary to paroxysmal atrial fibrillation.  She is tolerating use of warfarin  for embolic stroke risk mitigation due to  atrial fibrillation. Patient has no signs of  bleeding and is advised to notify her specialists prior to any procedure that may required suspension of warfarin    Primary hypertension Assessment & Plan: Well controlled on metoprolol,  amlodipine and telmisartan regimen. Renal function stable, no changes today.  Lab Results  Component Value Date   CREATININE 1.08 05/30/2022   Lab Results  Component Value Date   NA 139 05/30/2022   K 4.2 05/30/2022   CL 106 05/30/2022   CO2 28 05/30/2022      Other orders -     Rosuvastatin Calcium; Take 1 tablet (20 mg total) by mouth daily.  Dispense: 90 tablet; Refill: 3     I provided 30 minutes of face-to-face time during this encounter reviewing patient's last visit with me, patient's  most recent visit with cardiology,   recent surgical and non surgical procedures, previous  labs and imaging studies, counseling on currently addressed issues,  and post visit ordering to diagnostics and therapeutics .   Follow-up: Return in about 2 months (around 08/12/2022).   Crecencio Mc, MD

## 2022-05-30 NOTE — Assessment & Plan Note (Addendum)
Secondary to loss of husband Iona Beard one month ago. Patient is dealing with the unexpected loss of spouse and has adequate coping skills and emotional support  until March,  when her children return home .  i have asked patient  to return in 3 months to examine for signs of unresolving grief.

## 2022-05-30 NOTE — Assessment & Plan Note (Signed)
Episodes are rare .  She is anticoagulated   and rate is slow on metoprolol last inr was 1.9 dec 6

## 2022-06-01 ENCOUNTER — Encounter: Payer: Self-pay | Admitting: Internal Medicine

## 2022-06-01 MED ORDER — ROSUVASTATIN CALCIUM 20 MG PO TABS: 20.0000 mg | ORAL_TABLET | Freq: Every day | ORAL | 3 refills | Status: DC

## 2022-06-01 NOTE — Assessment & Plan Note (Signed)
secondary to paroxysmal atrial fibrillation.  She is tolerating use of warfarin  for embolic stroke risk mitigation due to  atrial fibrillation. Patient has no signs of bleeding and is advised to notify her specialists prior to any procedure that may required suspension of warfarin

## 2022-06-01 NOTE — Assessment & Plan Note (Signed)
Well controlled on metoprolol,  amlodipine and telmisartan regimen. Renal function stable, no changes today.  Lab Results  Component Value Date   CREATININE 1.08 05/30/2022   Lab Results  Component Value Date   NA 139 05/30/2022   K 4.2 05/30/2022   CL 106 05/30/2022   CO2 28 05/30/2022

## 2022-06-01 NOTE — Assessment & Plan Note (Signed)
Goal LDL is < 70 given atherosclerotic disease noted on brain MRi's.  Changing to high potency statin  Lab Results  Component Value Date   CHOL 206 (H) 05/30/2022   HDL 64.40 05/30/2022   LDLCALC 127 (H) 05/30/2022   LDLDIRECT 114.0 05/30/2022   TRIG 74.0 05/30/2022   CHOLHDL 3 05/30/2022

## 2022-08-12 ENCOUNTER — Ambulatory Visit (INDEPENDENT_AMBULATORY_CARE_PROVIDER_SITE_OTHER): Payer: Medicare Other | Admitting: Internal Medicine

## 2022-08-12 ENCOUNTER — Encounter: Payer: Self-pay | Admitting: Internal Medicine

## 2022-08-12 VITALS — BP 120/56 | HR 63 | Temp 98.1°F | Ht 63.0 in | Wt 148.2 lb

## 2022-08-12 DIAGNOSIS — F4321 Adjustment disorder with depressed mood: Secondary | ICD-10-CM

## 2022-08-12 DIAGNOSIS — Z1231 Encounter for screening mammogram for malignant neoplasm of breast: Secondary | ICD-10-CM | POA: Diagnosis not present

## 2022-08-12 DIAGNOSIS — E7800 Pure hypercholesterolemia, unspecified: Secondary | ICD-10-CM

## 2022-08-12 DIAGNOSIS — Z634 Disappearance and death of family member: Secondary | ICD-10-CM

## 2022-08-12 DIAGNOSIS — I480 Paroxysmal atrial fibrillation: Secondary | ICD-10-CM | POA: Diagnosis not present

## 2022-08-12 DIAGNOSIS — M48062 Spinal stenosis, lumbar region with neurogenic claudication: Secondary | ICD-10-CM | POA: Diagnosis not present

## 2022-08-12 NOTE — Patient Instructions (Signed)
I'm glad to see to see you doing well  You are blessed to have such wonderful children; you and Iona Beard were obviously wonderful parents.   I'll see you in June  Your annual mammogram has been ordered AND IS DUE in LATE March   [facility] will not allow [place] to schedule it for you,  so please  call to make your appointment [phone]

## 2022-08-12 NOTE — Progress Notes (Signed)
Subjective:  Patient ID: Ana Cruz, female    DOB: 1934-05-10  Age: 89 y.o. MRN: [id number]  CC: The primary encounter diagnosis was Spinal stenosis of lumbar region with neurogenic claudication. Diagnoses of Paroxysmal atrial fibrillation (Venturia), Breast cancer screening by mammogram, Pure hypercholesterolemia, and Grief at loss of child were also pertinent to this visit.   HPI Ana Cruz presents for  Chief Complaint  Patient presents with   Medical Management of Chronic Issues    2 month follow up     Follow up on Grief after losing her husband Iona Beard  in December.  2 of her 3 Children, John  ([place]) , wife Kambree  stayed for a month in November and have returned to [facility]   Manuela Schwartz  (and husband Mel ) are still here,  working remotely [place] ) .Her daughters have been  cooking for her .  She is adjusting to life with Iona Beard but has not been alone yet and feels it Is time.   She is worried about her son who she hasn't seen since October,  he has aggressive form  of  prostate CA  th(at has a genetic predisposition and has had multiple complications .  Lives in [place] , [place] . Has a port had external nephrostomy tubes .  She feels that the last 3 months of George's life were unreal.  She is spending more time with her friends at [facility]. Has signed up for a grief class  by the Hospice Chaplain  Has nights with frequent wakenings due to anxiety  PAF:   she is taking metoprolol for rate control and limiting her caffeine intake.  Her embolic risk is managed with warfarin. She has not had any episodes in over a month.  Her  cardiologist Dr Nehemiah Massed has retired.  She  has requested a referral to [facility] .    Outpatient Medications Prior to Visit  Medication Sig Dispense Refill   acetaminophen (TYLENOL) 500 MG tablet Take 500 mg by mouth every 6 (six) hours as needed (As needed for pain).     amLODipine (NORVASC) 5 MG tablet TAKE 1 TABLET (5 MG TOTAL) BY MOUTH  DAILY. 90 tablet 3   amoxicillin (AMOXIL) 500 MG capsule Take 500 mg by mouth daily. For dental procedures     Artificial Tear Ointment (EYE LUBRICANT OP) Apply 1 drop to eye 2 (two) times daily.     Cholecalciferol (VITAMIN D3) 1000 UNITS CAPS Take 1,000 Units by mouth daily.     fluticasone (FLONASE) 50 MCG/ACT nasal spray Place 1 spray into both nostrils daily. 16 g 0   levothyroxine (SYNTHROID) 75 MCG tablet TAKE 1 TABLET BY MOUTH EVERY DAY 90 tablet 1   metoprolol tartrate (LOPRESSOR) 25 MG tablet Take 12.5 mg by mouth daily.     Probiotic Product (PROBIOTIC-10) CHEW Chew 1 tablet by mouth daily.     rosuvastatin (CRESTOR) 20 MG tablet Take 1 tablet (20 mg total) by mouth daily. 90 tablet 3   telmisartan (MICARDIS) 80 MG tablet Take 80 mg by mouth daily.     vitamin B-12 (CYANOCOBALAMIN) 1000 MCG tablet Take 1 tablet (1,000 mcg total) by mouth daily. 90 tablet 3   vitamin E 180 MG (400 UNITS) capsule Take 800 Units by mouth daily.     warfarin (COUMADIN) 1 MG tablet Take 1 mg by mouth daily. Patient takes 1 mg 4 x a week then 2 mg on the other 3 days of  the week and gets checked regularly     No facility-administered medications prior to visit.    Review of Systems;  Patient denies headache, fevers, malaise, unintentional weight loss, skin rash, eye pain, sinus congestion and sinus pain, sore throat, dysphagia,  hemoptysis , cough, dyspnea, wheezing, chest pain, palpitations, orthopnea, edema, abdominal pain, nausea, melena, diarrhea, constipation, flank pain, dysuria, hematuria, urinary  Frequency, nocturia, numbness, tingling, seizures,  Focal weakness, Loss of consciousness,  Tremor, insomnia, depression, anxiety, and suicidal ideation.      Objective:  BP (!) 120/56   Pulse 63   Temp 98.1 F (36.7 C) (Oral)   Ht '5\' 3"'$  (1.6 m)   Wt 148 lb 3.2 oz (67.2 kg)   SpO2 98%   BMI 26.25 kg/m   BP Readings from Last 3 Encounters:  08/12/22 (!) 120/56  05/30/22 118/80  11/28/21  112/62    Wt Readings from Last 3 Encounters:  08/12/22 148 lb 3.2 oz (67.2 kg)  05/30/22 147 lb (66.7 kg)  11/28/21 154 lb 3.2 oz (69.9 kg)    Physical Exam Vitals reviewed.  Constitutional:      General: She is not in acute distress.    Appearance: Normal appearance. She is normal weight. She is not ill-appearing, toxic-appearing or diaphoretic.  HENT:     Head: Normocephalic.  Eyes:     General: No scleral icterus.       Right eye: No discharge.        Left eye: No discharge.     Conjunctiva/sclera: Conjunctivae normal.  Cardiovascular:     Rate and Rhythm: Normal rate and regular rhythm.     Heart sounds: Normal heart sounds.  Pulmonary:     Effort: Pulmonary effort is normal. No respiratory distress.     Breath sounds: Normal breath sounds.  Musculoskeletal:        General: Normal range of motion.  Skin:    General: Skin is warm and dry.  Neurological:     General: No focal deficit present.     Mental Status: She is alert and oriented to person, place, and time. Mental status is at baseline.  Psychiatric:        Mood and Affect: Mood normal.        Behavior: Behavior normal.        Thought Content: Thought content normal.        Judgment: Judgment normal.     Lab Results  Component Value Date   HGBA1C 6.0 11/28/2021   HGBA1C 5.7 (H) 06/29/2021   HGBA1C 5.7 05/22/2020    Lab Results  Component Value Date   CREATININE 1.08 05/30/2022   CREATININE 1.64 (H) 11/28/2021   CREATININE 1.31 (H) 05/30/2021    Lab Results  Component Value Date   WBC 6.9 06/28/2021   HGB 11.4 (L) 06/28/2021   HCT 35.5 (L) 06/28/2021   PLT 224.0 06/28/2021   GLUCOSE 95 05/30/2022   CHOL 206 (H) 05/30/2022   TRIG 74.0 05/30/2022   HDL 64.40 05/30/2022   LDLDIRECT 114.0 05/30/2022   LDLCALC 127 (H) 05/30/2022   ALT 10 05/30/2022   AST 18 05/30/2022   NA 139 05/30/2022   K 4.2 05/30/2022   CL 106 05/30/2022   CREATININE 1.08 05/30/2022   BUN 39 (H) 05/30/2022   CO2 28  05/30/2022   TSH 1.60 01/14/2022   HGBA1C 6.0 11/28/2021   MICROALBUR <0.7 12/17/2021    MM 3D SCREEN BREAST BILATERAL  Result Date: 08/28/2021 CLINICAL  DATA:  Screening. EXAM: DIGITAL SCREENING BILATERAL MAMMOGRAM WITH TOMOSYNTHESIS AND CAD TECHNIQUE: Bilateral screening digital craniocaudal and mediolateral oblique mammograms were obtained. Bilateral screening digital breast tomosynthesis was performed. The images were evaluated with computer-aided detection. COMPARISON:  Previous exam(s). ACR Breast Density Category b: There are scattered areas of fibroglandular density. FINDINGS: There are no findings suspicious for malignancy. IMPRESSION: No mammographic evidence of malignancy. A result letter of this screening mammogram will be mailed directly to the patient. RECOMMENDATION: Screening mammogram in one year. (Code:SM-B-01Y) BI-RADS CATEGORY  1: Negative. Electronically Signed   By: Everlean Alstrom M.D.   On: 08/28/2021 13:45    Assessment & Plan:  .Spinal stenosis of lumbar region with neurogenic claudication Assessment & Plan: By prior MRI in 2013. She has difficulty walking long distsances; handicapped placquard application signed    Paroxysmal atrial fibrillation [facility]) Assessment & Plan: Episodes are rare .  She is anticoagulated   and rate is slow on metoprolol .  Requesting Transfer of care to [place] [facility] since Nehemiah Massed has retired.     Orders: -     Ambulatory referral to Cardiology  Breast cancer screening by mammogram -     3D Screening Mammogram, Left and Right; Future  Pure hypercholesterolemia -     Lipid panel; Future -     Comprehensive metabolic panel; Future  Grief at loss of child Assessment & Plan: Iona Beard, her husband of 24 years,  passed in November.  Her children have been taking turns staying with her since then, and she is adjusting.  She has social interaction with [facility] friends and is scheduled to attend a course given the [employer].   Follow up in 3 months       I provided 30 minutes of face-to-face time during this encounter reviewing patient's last visit with me, patient's  most recent visit with cardiology,  , counseling on currently addressed issues,  and post visit ordering to diagnostics and therapeutics . ollow-up: Return in about 3 months (around 11/12/2022).   Crecencio Mc, MD

## 2022-08-12 NOTE — Assessment & Plan Note (Signed)
By prior MRI in 2013. She has difficulty walking long distsances; handicapped placquard application signed

## 2022-08-12 NOTE — Assessment & Plan Note (Signed)
Ana Cruz, her husband of 19 years,  passed in November.  Her children have been taking turns staying with her since then, and she is adjusting.  She has social interaction with [facility] friends and is scheduled to attend a course given the [employer].  Follow up in 3 months

## 2022-08-12 NOTE — Assessment & Plan Note (Addendum)
Episodes are rare .  She is anticoagulated   and rate is slow on metoprolol .  Requesting Transfer of care to [place] [facility] since Nehemiah Massed has retired.

## 2022-08-30 ENCOUNTER — Ambulatory Visit
Admission: RE | Admit: 2022-08-30 | Discharge: 2022-08-30 | Disposition: A | Discharge status: Home / Self Care | Payer: Medicare Other | Source: Ambulatory Visit | Attending: Internal Medicine | Admitting: Internal Medicine

## 2022-08-30 DIAGNOSIS — Z1231 Encounter for screening mammogram for malignant neoplasm of breast: Secondary | ICD-10-CM | POA: Insufficient documentation

## 2022-09-27 ENCOUNTER — Encounter: Payer: Self-pay | Admitting: Cardiology

## 2022-09-27 ENCOUNTER — Ambulatory Visit: Payer: Medicare Other | Attending: Cardiology | Admitting: Cardiology

## 2022-09-27 VITALS — BP 132/66 | HR 52 | Ht 63.0 in | Wt 150.0 lb

## 2022-09-27 DIAGNOSIS — I10 Essential (primary) hypertension: Secondary | ICD-10-CM | POA: Insufficient documentation

## 2022-09-27 DIAGNOSIS — E7800 Pure hypercholesterolemia, unspecified: Secondary | ICD-10-CM | POA: Diagnosis not present

## 2022-09-27 DIAGNOSIS — I480 Paroxysmal atrial fibrillation: Secondary | ICD-10-CM | POA: Diagnosis not present

## 2022-09-27 MED ORDER — METOPROLOL SUCCINATE ER 25 MG PO TB24: 12.5000 mg | ORAL_TABLET | Freq: Every day | ORAL | 3 refills | Status: DC

## 2022-09-27 NOTE — Progress Notes (Signed)
Cardiology Office Note:    Date:  09/27/2022   ID:  Ana Cruz, DOB 01-31-1934, MRN [id number]  PCP:  Sherlene Shams, MD   [facility] Providers Cardiologist:  Debbe Odea, MD     Referring MD: Sherlene Shams, MD   Chief Complaint  Patient presents with   New Patient (Initial Visit)    Patient denies new or acute cardiac problems/concerns today.      History of Present Illness:    Ana Cruz is a 89 y.o. female with a hx of hypertension, hyperlipidemia, paroxysmal atrial fibrillation who presents to establish care.  Previously seen by [facility] cardiology from a cardiac perspective.  Prior cardiologist retired.  Diagnosed with atrial fibrillation over 10 years ago, takes metoprolol for rate control, Coumadin for anticoagulation.  Denies any significant bleeding.  Denies palpitations, chest pain.  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   amLODipine (NORVASC) 5 MG tablet TAKE 1  TABLET (5 MG TOTAL) BY MOUTH DAILY.   amoxicillin (AMOXIL) 500 MG capsule Take 500 mg by mouth daily. For dental procedures   Artificial Tear Ointment (EYE LUBRICANT OP) Apply 1 drop to eye 2 (two) times daily.   Cholecalciferol (VITAMIN D3) 1000 UNITS CAPS Take 1,000 Units by mouth daily.   fluticasone (FLONASE) 50 MCG/ACT nasal spray Place 1 spray into both nostrils daily.   levothyroxine (SYNTHROID) 75 MCG tablet TAKE 1 TABLET BY MOUTH EVERY DAY   metoprolol succinate (TOPROL XL) 25 MG 24 hr tablet Take 0.5 tablets (12.5 mg total) by mouth daily.   Probiotic Product (PROBIOTIC-10) CHEW Chew 1 tablet by mouth daily.   rosuvastatin (CRESTOR) 20 MG tablet Take 1 tablet (20 mg total) by mouth daily.   telmisartan (MICARDIS) 80 MG tablet Take 80 mg by mouth daily.   vitamin B-12 (CYANOCOBALAMIN) 1000 MCG tablet Take 1 tablet (1,000 mcg total) by mouth daily.   vitamin E 180 MG (400 UNITS) capsule Take 800 Units by mouth daily.   warfarin (COUMADIN) 1 MG tablet Take 1 mg by mouth daily. Patient takes 1 mg 4 x a week then 2 mg on the other 3 days of the week and gets checked regularly   [DISCONTINUED] metoprolol tartrate (LOPRESSOR) 25 MG tablet Take 12.5 mg by mouth daily.     Allergies:   Pamelor [  nortriptyline], Chocolate flavor, Clindamycin/lincomycin, Onion, Other, Tape, and Hctz [hydrochlorothiazide]   Social History   Socioeconomic History   Marital status: Married    Spouse name: Not on file   Number of children: 4   Years of education: 13.5   Highest education level: Not on file  Occupational History   Occupation: retired  Tobacco Use   Smoking status: Never   Smokeless tobacco: Never  Vaping Use   Vaping Use: Never used  Substance and Sexual Activity   Alcohol use: Not Currently   Drug use: No   Sexual activity: Not on file  Other Topics Concern   Not on file  Social History Narrative   Married    [place]    Right handed   One story   Social Determinants of  Health   Financial Resource Strain: Low Risk  (11/20/2021)   Overall Financial Resource Strain (CARDIA)    Difficulty of Paying Living Expenses: Not hard at all  Food Insecurity: No Food Insecurity (11/20/2021)   Hunger Vital Sign    Worried About Running Out of Food in the Last Year: Never true    Ran Out of Food in the Last Year: Never true  Transportation Needs: No Transportation Needs (11/20/2021)   PRAPARE - Administrator, Civil Service (Medical): No    Lack of Transportation (Non-Medical): No  Physical Activity: Sufficiently Active (11/20/2021)   Exercise Vital Sign    Days of Exercise per Week: 3 days    Minutes of Exercise per Session: 60 min  Stress: No Stress Concern Present (11/20/2021)   [employer] of Occupational Health - Occupational Stress Questionnaire    Feeling of Stress : Not at all  Social Connections: Unknown (11/20/2021)   Social Connection and Isolation Panel [NHANES]    Frequency of Communication with Friends and Family: More than three times a week    Frequency of Social Gatherings with Friends and Family: More than three times a week    Attends Religious Services: Not on Marketing executive or Organizations: Not on file    Attends Banker Meetings: Not on file    Marital Status: Married     Family History: The patient's family history includes Cancer (age of onset: 47) in her paternal grandfather; Dementia in her mother; Heart disease (age of onset: 3) in her father; Hypertension in her mother. There is no history of Breast cancer.  ROS:   Please see the history of present illness.     All other systems reviewed and are negative.  EKGs/Labs/Other Studies Reviewed:    The following studies were reviewed today:   EKG:  EKG is  ordered today.  The ekg ordered today demonstrates sinus bradycardia, PACs.  Heart rate 52.  Recent Labs: 01/14/2022: TSH 1.60 05/30/2022: ALT 10; BUN 39; Creatinine, Ser 1.08; Potassium  4.2; Sodium 139  Recent Lipid Panel    Component Value Date/Time   CHOL 206 (H) 05/30/2022 1041   TRIG 74.0 05/30/2022 1041   HDL 64.40 05/30/2022 1041   CHOLHDL 3 05/30/2022 1041   VLDL 14.8 05/30/2022 1041   LDLCALC 127 (H) 05/30/2022 1041   LDLCALC 123 (H) 06/26/2018 1012   LDLDIRECT 114.0 05/30/2022 1041     Risk Assessment/Calculations:             Physical Exam:    VS:  BP 132/66 (BP Location: Left Arm, Patient Position: Sitting, Cuff Size: Normal)  Pulse (!) 52   Ht 5\' 3"  (1.6 m)   Wt 150 lb (68 kg)   SpO2 98%   BMI 26.57 kg/m     Wt Readings from Last 3 Encounters:  09/27/22 150 lb (68 kg)  08/12/22 148 lb 3.2 oz (67.2 kg)  05/30/22 147 lb (66.7 kg)     GEN:  Well nourished, well developed in no acute distress HEENT: Normal NECK: No JVD; No carotid bruits CARDIAC: Bradycardic, regular RESPIRATORY:  Clear to auscultation without rales, wheezing or rhonchi  ABDOMEN: Soft, non-tender, non-distended MUSCULOSKELETAL:  No edema; No deformity  SKIN: Warm and dry NEUROLOGIC:  Alert and oriented x 3 PSYCHIATRIC:  Normal affect   ASSESSMENT:    1. Paroxysmal atrial fibrillation (HCC)   2. Primary hypertension   3. Pure hypercholesterolemia    PLAN:    In order of problems listed above:  Paroxysmal atrial fibrillation, asymptomatic with no palpitations.  Switch Lopressor to Toprol-XL 12.5 mg daily.  Continue warfarin, establish care with Coumadin clinic.  Get echo. Hypertension, BP controlled.  Continue Norvasc, telmisartan, Toprol-XL. Hyperlipidemia, low-cholesterol diet, statin okay.  Follow-up after echo.  If echo is normal otherwise, plan for annual visits.      Medication Adjustments/Labs and Tests Ordered: Current medicines are reviewed at length with the patient today.  Concerns regarding medicines are outlined above.  Orders Placed This Encounter  Procedures   EKG 12-Lead   ECHOCARDIOGRAM COMPLETE   Meds ordered this encounter   Medications   metoprolol succinate (TOPROL XL) 25 MG 24 hr tablet    Sig: Take 0.5 tablets (12.5 mg total) by mouth daily.    Dispense:  45 tablet    Refill:  3    Patient Instructions  Medication Instructions:   STOP metoprolol tartrate (LOPRESSOR) 25 MG tablet  START metoprolol succinate (TOPROL XL) - take half tablet (12.5) by mouth daily.   *If you need a refill on your cardiac medications before your next appointment, please call your pharmacy*   Lab Work:  None Ordered  If you have labs (blood work) drawn today and your tests are completely normal, you will receive your results only by: MyChart Message (if you have MyChart) OR A paper copy in the mail If you have any lab test that is abnormal or we need to change your treatment, we will call you to review the results.   Testing/Procedures:  Your physician has requested that you have an echocardiogram. Echocardiography is a painless test that uses sound waves to create images of your heart. It provides your doctor with information about the size and shape of your heart and how well your heart's chambers and valves are working. This procedure takes approximately one hour. There are no restrictions for this procedure. Please do NOT wear cologne, perfume, aftershave, or lotions (deodorant is allowed). Please arrive 15 minutes prior to your appointment time.    Follow-Up: At [facility], you and your health needs are our priority.  As part of our continuing mission to provide you with exceptional heart care, we have created designated Provider Care Teams.  These Care Teams include your primary Cardiologist (physician) and Advanced Practice Providers (APPs -  Physician Assistants and Nurse Practitioners) who all work together to provide you with the care you need, when you need it.  We recommend signing up for the patient portal called "MyChart".  Sign up information is provided on this After Visit Summary.  MyChart  is used to connect with  patients for Virtual Visits (Telemedicine).  Patients are able to view lab/test results, encounter notes, upcoming appointments, etc.  Non-urgent messages can be sent to your provider as well.   To learn more about what you can do with MyChart, go to [email or web address].    Your next appointment:    After Echocardiogram  Provider:   You may see Debbe Odea, MD ONLY    Signed, Debbe Odea, MD  09/27/2022 9:50 AM    [facility]

## 2022-09-27 NOTE — Patient Instructions (Signed)
Medication Instructions:   STOP metoprolol tartrate (LOPRESSOR) 25 MG tablet  START metoprolol succinate (TOPROL XL) - take half tablet (12.5) by mouth daily.   *If you need a refill on your cardiac medications before your next appointment, please call your pharmacy*   Lab Work:  None Ordered  If you have labs (blood work) drawn today and your tests are completely normal, you will receive your results only by: MyChart Message (if you have MyChart) OR A paper copy in the mail If you have any lab test that is abnormal or we need to change your treatment, we will call you to review the results.   Testing/Procedures:  Your physician has requested that you have an echocardiogram. Echocardiography is a painless test that uses sound waves to create images of your heart. It provides your doctor with information about the size and shape of your heart and how well your heart's chambers and valves are working. This procedure takes approximately one hour. There are no restrictions for this procedure. Please do NOT wear cologne, perfume, aftershave, or lotions (deodorant is allowed). Please arrive 15 minutes prior to your appointment time.    Follow-Up: At [facility], you and your health needs are our priority.  As part of our continuing mission to provide you with exceptional heart care, we have created designated Provider Care Teams.  These Care Teams include your primary Cardiologist (physician) and Advanced Practice Providers (APPs -  Physician Assistants and Nurse Practitioners) who all work together to provide you with the care you need, when you need it.  We recommend signing up for the patient portal called "MyChart".  Sign up information is provided on this After Visit Summary.  MyChart is used to connect with patients for Virtual Visits (Telemedicine).  Patients are able to view lab/test results, encounter notes, upcoming appointments, etc.  Non-urgent messages can be sent to  your provider as well.   To learn more about what you can do with MyChart, go to [email or web address].    Your next appointment:    After Echocardiogram  Provider:   You may see Debbe Odea, MD ONLY

## 2022-10-01 ENCOUNTER — Telehealth: Payer: Self-pay | Admitting: Cardiology

## 2022-10-01 NOTE — Telephone Encounter (Signed)
Pt stated she had an appt with provider last week and forgot to mention her wanting to have her coumadin checked. She'd like a call back to see about having a INR exam done. Please advise.

## 2022-10-01 NOTE — Telephone Encounter (Signed)
Returned call to pt, pt states she has been having Warfarin managed by Minda Ditto at [facility], last INR 4/24, usually rechecks every 4 weeks.  Pt has been on Warfarin for many years.  Saw Dr Azucena Cecil last week and was in his note to establish in Coumadin Clinic. Scheduled new pt Coumadin Clinic appt on 10/23/22 at 10:30am, asked pt to bring Warfarin bottles with her as she is on 2 different tablets sizes.  Pt verbalized understanding and thanked me for the call back.

## 2022-10-23 ENCOUNTER — Other Ambulatory Visit: Payer: Self-pay

## 2022-10-23 ENCOUNTER — Ambulatory Visit: Payer: Medicare Other | Attending: Cardiovascular Disease

## 2022-10-23 DIAGNOSIS — Z7901 Long term (current) use of anticoagulants: Secondary | ICD-10-CM

## 2022-10-23 DIAGNOSIS — I4891 Unspecified atrial fibrillation: Secondary | ICD-10-CM | POA: Diagnosis not present

## 2022-10-23 DIAGNOSIS — I480 Paroxysmal atrial fibrillation: Secondary | ICD-10-CM

## 2022-10-23 LAB — POCT INR: INR: 3.1 — AB (ref 2.0–3.0)

## 2022-10-23 NOTE — Patient Instructions (Signed)
Continue taking the 1 mg tablet on Sunday, Monday, Wednesday and Friday and 2 mg tablet on Tuesday, Thursday and Saturday. INR in 4 weeks.  Eat greens tonight.   A full discussion of the nature of anticoagulants has been carried out.  A benefit risk analysis has been presented to the patient, so that they understand the justification for choosing anticoagulation at this time. The need for frequent and regular monitoring, precise dosage adjustment and compliance is stressed.  Side effects of potential bleeding are discussed.  The patient should avoid any OTC items containing aspirin or ibuprofen, and should avoid great swings in general diet.  Avoid alcohol consumption.  Call if any signs of abnormal bleeding.  [phone]

## 2022-10-25 ENCOUNTER — Ambulatory Visit: Payer: Medicare Other | Attending: Cardiology

## 2022-10-25 DIAGNOSIS — I480 Paroxysmal atrial fibrillation: Secondary | ICD-10-CM | POA: Insufficient documentation

## 2022-10-26 LAB — ECHOCARDIOGRAM COMPLETE
Area-P 1/2: 3.72 cm2
S' Lateral: 2.4 cm
Single Plane A4C EF: 62.5 %

## 2022-11-08 ENCOUNTER — Other Ambulatory Visit: Payer: Self-pay | Admitting: Internal Medicine

## 2022-11-08 ENCOUNTER — Other Ambulatory Visit (INDEPENDENT_AMBULATORY_CARE_PROVIDER_SITE_OTHER): Payer: Medicare Other

## 2022-11-08 DIAGNOSIS — E7800 Pure hypercholesterolemia, unspecified: Secondary | ICD-10-CM

## 2022-11-08 LAB — COMPREHENSIVE METABOLIC PANEL
ALT: 12 U/L (ref 0–35)
AST: 19 U/L (ref 0–37)
Albumin: 4 g/dL (ref 3.5–5.2)
Alkaline Phosphatase: 76 U/L (ref 39–117)
BUN: 43 mg/dL — ABNORMAL HIGH (ref 6–23)
CO2: 27 mEq/L (ref 19–32)
Calcium: 9.3 mg/dL (ref 8.4–10.5)
Chloride: 105 mEq/L (ref 96–112)
Creatinine, Ser: 1.24 mg/dL — ABNORMAL HIGH (ref 0.40–1.20)
GFR: 39.18 mL/min — ABNORMAL LOW (ref >60.00)
Glucose, Bld: 88 mg/dL (ref 70–99)
Potassium: 4.4 mEq/L (ref 3.5–5.1)
Sodium: 140 mEq/L (ref 135–145)
Total Bilirubin: 0.5 mg/dL (ref 0.2–1.2)
Total Protein: 7.4 g/dL (ref 6.0–8.3)

## 2022-11-08 LAB — LIPID PANEL
Cholesterol: 142 mg/dL (ref 0–200)
HDL: 71.1 mg/dL (ref >39.00)
LDL Cholesterol: 59 mg/dL (ref 0–99)
NonHDL: 70.82
Total CHOL/HDL Ratio: 2
Triglycerides: 61 mg/dL (ref 0.0–149.0)
VLDL: 12.2 mg/dL (ref 0.0–40.0)

## 2022-11-12 ENCOUNTER — Encounter: Payer: Self-pay | Admitting: Internal Medicine

## 2022-11-12 ENCOUNTER — Ambulatory Visit (INDEPENDENT_AMBULATORY_CARE_PROVIDER_SITE_OTHER): Payer: Medicare Other | Admitting: Internal Medicine

## 2022-11-12 VITALS — BP 128/52 | HR 47 | Temp 97.7°F | Ht 63.0 in | Wt 148.8 lb

## 2022-11-12 DIAGNOSIS — D6869 Other thrombophilia: Secondary | ICD-10-CM | POA: Diagnosis not present

## 2022-11-12 DIAGNOSIS — E039 Hypothyroidism, unspecified: Secondary | ICD-10-CM

## 2022-11-12 DIAGNOSIS — E7800 Pure hypercholesterolemia, unspecified: Secondary | ICD-10-CM | POA: Diagnosis not present

## 2022-11-12 DIAGNOSIS — F4321 Adjustment disorder with depressed mood: Secondary | ICD-10-CM

## 2022-11-12 DIAGNOSIS — I10 Essential (primary) hypertension: Secondary | ICD-10-CM

## 2022-11-12 DIAGNOSIS — N1832 Chronic kidney disease, stage 3b: Secondary | ICD-10-CM

## 2022-11-12 NOTE — Patient Instructions (Addendum)
You are doing well!  Remember:     "Be anxious for nothing,  But in everything , by prayer and supplication , let your request by made known to God. And the peace of God which surpasses all understanding, shall guard your hearts and minds through [employer]."    I'll see you in 6 months,  sooner if you need me.

## 2022-11-12 NOTE — Progress Notes (Signed)
Subjective:  Patient ID: Ana Cruz, female    DOB: [birth year]  Age: 89 y.o. MRN: [id number]  CC: The primary encounter diagnosis was Acquired hypothyroidism. Diagnoses of Acquired thrombophilia (HCC), Pure hypercholesterolemia, Stage 3b chronic kidney disease (HCC), Primary hypertension, and Grief reaction were also pertinent to this visit.   HPI [facility] presents for  Chief Complaint  Patient presents with   Medical Management of Chronic Issues    3 month follow up    Follow up on grief following the loss of her husband Greggory Stallion, of 66 years ,  in December .  She is feeling somewhat better after spending several months surrounded by her children,  but has been  worried about her  son Cleone Slim, 66   who lives in [place], and  is fighting prostate cancer,  and had to stop chemo at 9/18  having recurrent infections  involving his nephrostomy tubes. His wife had to return to [place] bc her mother died,  so her other son Jonny Ruiz  is staying with Rob  until wife returns   She has completed 6 weeks of grief counselling.  Not losing sleep . Planning to return to regular exercise  PAF : saw  Debbe Odea .  Lopressor changed to Toprol XL 1/2 tablet    Outpatient Medications Prior to Visit  Medication Sig Dispense Refill   acetaminophen (TYLENOL) 500 MG tablet Take 500 mg by mouth every 6 (six) hours as needed (As needed for pain).     amLODipine (NORVASC) 5 MG tablet TAKE 1 TABLET (5 MG TOTAL) BY MOUTH DAILY. 90 tablet 3   amoxicillin (AMOXIL) 500 MG capsule Take 500 mg by mouth daily. For dental procedures     Artificial Tear Ointment (EYE LUBRICANT OP) Apply 1 drop to eye 2 (two) times daily.     Cholecalciferol (VITAMIN D3) 1000 UNITS CAPS Take 1,000 Units by mouth daily.     fluticasone (FLONASE) 50 MCG/ACT nasal spray Place 1 spray into both nostrils daily. 16 g 0   levothyroxine (SYNTHROID) 75 MCG tablet TAKE 1 TABLET BY MOUTH EVERY DAY 90 tablet 1   metoprolol  succinate (TOPROL XL) 25 MG 24 hr tablet Take 0.5 tablets (12.5 mg total) by mouth daily. 45 tablet 3   Probiotic Product (PROBIOTIC-10) CHEW Chew 1 tablet by mouth daily.     rosuvastatin (CRESTOR) 20 MG tablet Take 1 tablet (20 mg total) by mouth daily. 90 tablet 3   telmisartan (MICARDIS) 80 MG tablet Take 80 mg by mouth daily.     vitamin B-12 (CYANOCOBALAMIN) 1000 MCG tablet Take 1 tablet (1,000 mcg total) by mouth daily. 90 tablet 3   vitamin E 180 MG (400 UNITS) capsule Take 800 Units by mouth daily.     warfarin (COUMADIN) 1 MG tablet Take 1 mg by mouth daily. Patient takes 1 mg 4 x a week then 2 mg on the other 3 days of the week and gets checked regularly     No facility-administered medications prior to visit.    Review of Systems;  Patient denies headache, fevers, malaise, unintentional weight loss, skin rash, eye pain, sinus congestion and sinus pain, sore throat, dysphagia,  hemoptysis , cough, dyspnea, wheezing, chest pain, palpitations, orthopnea, edema, abdominal pain, nausea, melena, diarrhea, constipation, flank pain, dysuria, hematuria, urinary  Frequency, nocturia, numbness, tingling, seizures,  Focal weakness, Loss of consciousness,  Tremor, insomnia, depression, anxiety, and suicidal ideation.      Objective:  BP [name])  128/52   Pulse (!) 47   Temp 97.7 F (36.5 C) (Oral)   Ht 5\' 3"  (1.6 m)   Wt 148 lb 12.8 oz (67.5 kg)   SpO2 95%   BMI 26.36 kg/m   BP Readings from Last 3 Encounters:  11/12/22 (!) 128/52  09/27/22 132/66  08/12/22 (!) 120/56    Wt Readings from Last 3 Encounters:  11/12/22 148 lb 12.8 oz (67.5 kg)  09/27/22 150 lb (68 kg)  08/12/22 148 lb 3.2 oz (67.2 kg)    Physical Exam Vitals reviewed.  Constitutional:      General: She is not in acute distress.    Appearance: Normal appearance. She is normal weight. She is not ill-appearing, toxic-appearing or diaphoretic.  HENT:     Head: Normocephalic.  Eyes:     General: No scleral icterus.        Right eye: No discharge.        Left eye: No discharge.     Conjunctiva/sclera: Conjunctivae normal.  Cardiovascular:     Rate and Rhythm: Normal rate and regular rhythm.     Heart sounds: Normal heart sounds.  Pulmonary:     Effort: Pulmonary effort is normal. No respiratory distress.     Breath sounds: Normal breath sounds.  Musculoskeletal:        General: Normal range of motion.  Skin:    General: Skin is warm and dry.  Neurological:     General: No focal deficit present.     Mental Status: She is alert and oriented to person, place, and time. Mental status is at baseline.  Psychiatric:        Mood and Affect: Mood normal.        Behavior: Behavior normal.        Thought Content: Thought content normal.        Judgment: Judgment normal.    Lab Results  Component Value Date   HGBA1C 6.0 11/28/2021   HGBA1C 5.7 (H) 06/29/2021   HGBA1C 5.7 05/22/2020    Lab Results  Component Value Date   CREATININE 1.24 (H) 11/08/2022   CREATININE 1.08 05/30/2022   CREATININE 1.64 (H) 11/28/2021    Lab Results  Component Value Date   WBC 6.9 06/28/2021   HGB 11.4 (L) 06/28/2021   HCT 35.5 (L) 06/28/2021   PLT 224.0 06/28/2021   GLUCOSE 88 11/08/2022   CHOL 142 11/08/2022   TRIG 61.0 11/08/2022   HDL 71.10 11/08/2022   LDLDIRECT 114.0 05/30/2022   LDLCALC 59 11/08/2022   ALT 12 11/08/2022   AST 19 11/08/2022   NA 140 11/08/2022   K 4.4 11/08/2022   CL 105 11/08/2022   CREATININE 1.24 (H) 11/08/2022   BUN 43 (H) 11/08/2022   CO2 27 11/08/2022   TSH 1.60 01/14/2022   INR 3.1 (A) 10/23/2022   HGBA1C 6.0 11/28/2021   MICROALBUR <0.7 12/17/2021    MM 3D SCREENING MAMMOGRAM BILATERAL BREAST  Result Date: 09/02/2022 CLINICAL DATA:  Screening. EXAM: DIGITAL SCREENING BILATERAL MAMMOGRAM WITH TOMOSYNTHESIS AND CAD TECHNIQUE: Bilateral screening digital craniocaudal and mediolateral oblique mammograms were obtained. Bilateral screening digital breast tomosynthesis was  performed. The images were evaluated with computer-aided detection. COMPARISON:  Previous exam(s). ACR Breast Density Category b: There are scattered areas of fibroglandular density. FINDINGS: There are no findings suspicious for malignancy. IMPRESSION: No mammographic evidence of malignancy. A result letter of this screening mammogram will be mailed directly to the patient. RECOMMENDATION: Screening mammogram in one year. (  Code:SM-B-01Y) BI-RADS CATEGORY  1: Negative. Electronically Signed   By: Baird Lyons M.D.   On: 09/02/2022 15:18    Assessment & Plan:  .Acquired hypothyroidism Assessment & Plan: Thyroid function is WNL on current dose of 75 mcg daily and she is asymptomatic .  No current changes needed.   Lab Results  Component Value Date   TSH 1.60 01/14/2022     Orders: -     TSH; Future  Acquired thrombophilia (HCC) Assessment & Plan: secondary to paroxysmal atrial fibrillation.  She is tolerating use of warfarin  for embolic stroke risk mitigation due to  atrial fibrillation. Patient has no signs of bleeding and is advised to notify her specialists prior to any procedure that may required suspension of warfarin   Orders: -     CBC with Differential/Platelet; Future  Pure hypercholesterolemia -     Comprehensive metabolic panel; Future -     Lipid Panel w/reflex Direct LDL; Future  Stage 3b chronic kidney disease (HCC) Assessment & Plan: Secondary to hypertension.  She is on an ARB for control of hypertension,, and statin for control of hyperlipidemia. GFR is stable and she follows up with Dr Thedore Mins every 6 months   .   Lab Results  Component Value Date   CREATININE 1.24 (H) 11/08/2022      Primary hypertension Assessment & Plan: Well controlled on metoprolol,  amlodipine and telmisartan regimen. Renal function stable, no changes today.  Lab Results  Component Value Date   CREATININE 1.24 (H) 11/08/2022   Lab Results  Component Value Date   NA 140 11/08/2022    K 4.4 11/08/2022   CL 105 11/08/2022   CO2 27 11/08/2022      Grief reaction Assessment & Plan: Secondary to loss of husband Greggory Stallion in December . She is recovering well and has wonderful support form her remaining children       I provided 30 minutes of face-to-face time during this encounter reviewing patient's last visit with me, patient's  most recent visit with cardiology and   nephrology,  counseling on currently addressed issues,  and post visit ordering to diagnostics and therapeutics .   Follow-up: Return in about 6 months (around 05/14/2023).   Sherlene Shams, MD

## 2022-11-13 ENCOUNTER — Ambulatory Visit: Payer: Medicare Other | Admitting: Cardiology

## 2022-11-15 ENCOUNTER — Encounter: Payer: Self-pay | Admitting: Internal Medicine

## 2022-11-15 NOTE — Assessment & Plan Note (Signed)
secondary to paroxysmal atrial fibrillation.  She is tolerating use of warfarin  for embolic stroke risk mitigation due to  atrial fibrillation. Patient has no signs of bleeding and is advised to notify her specialists prior to any procedure that may required suspension of warfarin  

## 2022-11-15 NOTE — Assessment & Plan Note (Signed)
Secondary to loss of husband Greggory Stallion in December . She is recovering well and has wonderful support form her remaining children

## 2022-11-15 NOTE — Assessment & Plan Note (Signed)
Well controlled on metoprolol,  amlodipine and telmisartan regimen. Renal function stable, no changes today.  Lab Results  Component Value Date   CREATININE 1.24 (H) 11/08/2022   Lab Results  Component Value Date   NA 140 11/08/2022   K 4.4 11/08/2022   CL 105 11/08/2022   CO2 27 11/08/2022

## 2022-11-15 NOTE — Assessment & Plan Note (Signed)
Secondary to hypertension.  She is on an ARB for control of hypertension,, and statin for control of hyperlipidemia. GFR is stable and she follows up with Dr Thedore Mins every 6 months   .   Lab Results  Component Value Date   CREATININE 1.24 (H) 11/08/2022

## 2022-11-15 NOTE — Assessment & Plan Note (Signed)
Thyroid function is WNL on current dose of 75 mcg daily and she is asymptomatic .  No current changes needed.   Lab Results  Component Value Date   TSH 1.60 01/14/2022

## 2022-11-18 ENCOUNTER — Encounter: Payer: Self-pay | Admitting: Cardiology

## 2022-11-18 ENCOUNTER — Ambulatory Visit: Payer: Medicare Other | Attending: Cardiology | Admitting: Cardiology

## 2022-11-18 VITALS — BP 122/56 | HR 58 | Ht 63.0 in | Wt 149.4 lb

## 2022-11-18 DIAGNOSIS — I10 Essential (primary) hypertension: Secondary | ICD-10-CM | POA: Diagnosis present

## 2022-11-18 DIAGNOSIS — I480 Paroxysmal atrial fibrillation: Secondary | ICD-10-CM | POA: Diagnosis present

## 2022-11-18 DIAGNOSIS — E7800 Pure hypercholesterolemia, unspecified: Secondary | ICD-10-CM | POA: Diagnosis present

## 2022-11-18 NOTE — Progress Notes (Signed)
Cardiology Office Note:    Date:  11/18/2022   ID:  Ana Cruz, DOB 02-13-1934, MRN [id number]  PCP:  Sherlene Shams, MD   [facility] Providers Cardiologist:  Debbe Odea, MD     Referring MD: Sherlene Shams, MD   Chief Complaint  Patient presents with   Follow-up    Discuss echo results.  Patient denies new or acute cardiac problems/concerns today.      History of Present Illness:    Ana Cruz is a 89 y.o. female with a hx of hypertension, hyperlipidemia, paroxysmal atrial fibrillation who presents for follow-up.  Previously seen to establish care due to atrial fibrillation.  Echocardiogram was obtained to evaluate any significant structural abnormalities.  Patient did establish care with Coumadin clinic.  She denies chest pain, palpitations.  Blood pressure adequately controlled, compliant with Toprol-XL, Coumadin as prescribed.  Feels well, no new concerns or issues at this time.   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   amLODipine (NORVASC) 5 MG tablet TAKE 1 TABLET (5 MG TOTAL) BY MOUTH DAILY.   amoxicillin (AMOXIL) 500 MG capsule Take 500 mg by mouth daily. For dental procedures   Artificial Tear Ointment (EYE LUBRICANT OP) Apply 1 drop to eye 2 (two) times daily.   Cholecalciferol (VITAMIN D3) 1000 UNITS CAPS Take 1,000 Units by mouth daily.   fluticasone (FLONASE) 50 MCG/ACT nasal spray Place 1 spray into both nostrils daily.   levothyroxine (SYNTHROID) 75 MCG tablet TAKE 1 TABLET BY MOUTH EVERY DAY   metoprolol succinate (TOPROL XL) 25 MG 24 hr tablet Take 0.5 tablets (12.5 mg total) by mouth daily.   Probiotic Product (PROBIOTIC-10) CHEW Chew 1 tablet by mouth daily.   rosuvastatin (CRESTOR) 20 MG tablet Take 1 tablet (20 mg total) by mouth daily.   telmisartan (MICARDIS) 80 MG tablet Take 80 mg by mouth daily.   vitamin B-12 (CYANOCOBALAMIN) 1000 MCG tablet Take 1 tablet (1,000 mcg total) by mouth daily.   vitamin E 180 MG (400 UNITS) capsule Take 800 Units by mouth daily.   warfarin (COUMADIN) 1 MG tablet Take 1 mg by mouth daily. Patient takes 1 mg 4 x a week then 2 mg on the other 3 days of the week and gets checked  regularly     Allergies:   Pamelor [nortriptyline], Chocolate flavor, Clindamycin/lincomycin, Onion, Other, Tape, and Hctz [hydrochlorothiazide]   Social History   Socioeconomic History   Marital status: Widowed    Spouse name: Not on file   Number of children: 4   Years of education: 13.5   Highest education level: Some college, no degree  Occupational History   Occupation: retired  Tobacco Use   Smoking status: Never   Smokeless tobacco: Never  Vaping Use   Vaping Use: Never used  Substance and Sexual Activity   Alcohol use: Not Currently   Drug use: No   Sexual activity: Not on file  Other Topics Concern   Not on file  Social History Narrative   Married    [place]    Right handed   One story   Social Determinants of Health   Financial Resource Strain: Low Risk  (11/08/2022)   Overall Financial Resource Strain (CARDIA)    Difficulty of Paying Living Expenses: Not hard at all  Food Insecurity: No Food Insecurity (11/08/2022)   Hunger Vital Sign    Worried About Running Out of Food in the Last Year: Never true    Ran Out of Food in the Last Year: Never true  Transportation Needs: No Transportation Needs (11/08/2022)   PRAPARE - Administrator, Civil Service (Medical): No    Lack of Transportation (Non-Medical): No  Physical Activity: Insufficiently Active (11/08/2022)   Exercise Vital Sign    Days of Exercise per Week: 2 days    Minutes of Exercise per Session: 30 min  Stress: No Stress Concern Present (11/08/2022)   [employer] of Occupational Health - Occupational Stress Questionnaire    Feeling of Stress : Only a little  Social Connections: Moderately Isolated (11/08/2022)   Social Connection and Isolation Panel [NHANES]    Frequency of Communication with Friends and Family: More than three times a week    Frequency of Social Gatherings with Friends and Family: Three times a week    Attends Religious Services: More than 4 times per year    Active Member of Clubs or Organizations: No    Attends Banker Meetings: Not on file    Marital Status: Widowed     Family History: The patient's family history includes Cancer (age of onset: 32) in her paternal grandfather; Dementia in her mother; Heart disease (age of onset: 55) in her father; Hypertension in her mother. There is no history of Breast cancer.  ROS:   Please see the history of present illness.     All other systems reviewed and are negative.  EKGs/Labs/Other Studies Reviewed:    The following studies were reviewed today:   EKG:  EKG not ordered today.   Recent Labs: 01/14/2022: TSH 1.60 11/08/2022: ALT 12; BUN 43; Creatinine, Ser 1.24; Potassium 4.4;  Sodium 140  Recent Lipid Panel    Component Value Date/Time   CHOL 142 11/08/2022 0909   TRIG 61.0 11/08/2022 0909   HDL 71.10 11/08/2022 0909   CHOLHDL 2 11/08/2022 0909   VLDL 12.2 11/08/2022 0909   LDLCALC 59 11/08/2022 0909   LDLCALC 123 (H) 06/26/2018 1012   LDLDIRECT 114.0 05/30/2022 1041     Risk Assessment/Calculations:             Physical Exam:    VS:  BP (!) 122/56 (BP Location: Left Arm, Patient Position: Sitting, Cuff Size: Normal)   Pulse (!) 58  Ht 5\' 3"  (1.6 m)   Wt 149 lb 6.4 oz (67.8 kg)   SpO2 98%   BMI 26.47 kg/m     Wt Readings from Last 3 Encounters:  11/18/22 149 lb 6.4 oz (67.8 kg)  11/12/22 148 lb 12.8 oz (67.5 kg)  09/27/22 150 lb (68 kg)     GEN:  Well nourished, well developed in no acute distress HEENT: Normal NECK: No JVD; No carotid bruits CARDIAC: Bradycardic, regular RESPIRATORY:  Clear to auscultation without rales, wheezing or rhonchi  ABDOMEN: Soft, non-tender, non-distended MUSCULOSKELETAL:  No edema; No deformity  SKIN: Warm and dry NEUROLOGIC:  Alert and oriented x 3 PSYCHIATRIC:  Normal affect   ASSESSMENT:    1. Paroxysmal atrial fibrillation (HCC)   2. Primary hypertension   3. Pure hypercholesterolemia     PLAN:    In order of problems listed above:  Paroxysmal atrial fibrillation, asymptomatic with no palpitations.  Echo 5/24 EF 60 to 65%. continue Toprol-XL 12.5 mg daily, warfarin. Hypertension, BP controlled.  Continue Norvasc, telmisartan, Toprol-XL. Hyperlipidemia, low-cholesterol diet, statin okay.  Follow-up yearly      Medication Adjustments/Labs and Tests Ordered: Current medicines are reviewed at length with the patient today.  Concerns regarding medicines are outlined above.  No orders of the defined types were placed in this encounter.  No orders of the defined types were placed in this encounter.   Patient Instructions  Medication Instructions:   Your physician recommends that you  continue on your current medications as directed. Please refer to the Current Medication list given to you today.  *If you need a refill on your cardiac medications before your next appointment, please call your pharmacy*   Lab Work:  None Ordered  If you have labs (blood work) drawn today and your tests are completely normal, you will receive your results only by: MyChart Message (if you have MyChart) OR A paper copy in the mail If you have any lab test that is abnormal or we need to change your treatment, we will call you to review the results.   Testing/Procedures:  None Ordered   Follow-Up: At [facility], you and your health needs are our priority.  As part of our continuing mission to provide you with exceptional heart care, we have created designated Provider Care Teams.  These Care Teams include your primary Cardiologist (physician) and Advanced Practice Providers (APPs -  Physician Assistants and Nurse Practitioners) who all work together to provide you with the care you need, when you need it.  We recommend signing up for the patient portal called "MyChart".  Sign up information is provided on this After Visit Summary.  MyChart is used to connect with patients for Virtual Visits (Telemedicine).  Patients are able to view lab/test results, encounter notes, upcoming appointments, etc.  Non-urgent messages can be sent to your provider as well.   To learn more about what you can do with MyChart, go to [email or web address].    Your next appointment:   12 month(s)  Provider:   You may see Debbe Odea, MD or one of the following Advanced Practice Providers on your designated Care Team:   Nicolasa Ducking, NP Eula Listen, PA-C Cadence Fransico Michael, PA-C Charlsie Quest, NP   Signed, Debbe Odea, MD  11/18/2022 2:17 PM    [facility]

## 2022-11-18 NOTE — Patient Instructions (Signed)
Medication Instructions:   Your physician recommends that you continue on your current medications as directed. Please refer to the Current Medication list given to you today.  *If you need a refill on your cardiac medications before your next appointment, please call your pharmacy*   Lab Work:  None Ordered  If you have labs (blood work) drawn today and your tests are completely normal, you will receive your results only by: MyChart Message (if you have MyChart) OR A paper copy in the mail If you have any lab test that is abnormal or we need to change your treatment, we will call you to review the results.   Testing/Procedures:  None Ordered    Follow-Up: At [facility], you and your health needs are our priority.  As part of our continuing mission to provide you with exceptional heart care, we have created designated Provider Care Teams.  These Care Teams include your primary Cardiologist (physician) and Advanced Practice Providers (APPs -  Physician Assistants and Nurse Practitioners) who all work together to provide you with the care you need, when you need it.  We recommend signing up for the patient portal called "MyChart".  Sign up information is provided on this After Visit Summary.  MyChart is used to connect with patients for Virtual Visits (Telemedicine).  Patients are able to view lab/test results, encounter notes, upcoming appointments, etc.  Non-urgent messages can be sent to your provider as well.   To learn more about what you can do with MyChart, go to [email or web address].    Your next appointment:   12 month(s)  Provider:   You may see Brian Agbor-Etang, MD or one of the following Advanced Practice Providers on your designated Care Team:   Christopher Berge, NP Ryan Dunn, PA-C Cadence Furth, PA-C Sheri Hammock, NP  

## 2022-11-20 ENCOUNTER — Ambulatory Visit: Payer: Medicare Other | Attending: Cardiology

## 2022-11-20 DIAGNOSIS — I480 Paroxysmal atrial fibrillation: Secondary | ICD-10-CM | POA: Diagnosis present

## 2022-11-20 DIAGNOSIS — Z7901 Long term (current) use of anticoagulants: Secondary | ICD-10-CM | POA: Insufficient documentation

## 2022-11-20 LAB — POCT INR: INR: 2.8 (ref 2.0–3.0)

## 2022-11-20 NOTE — Patient Instructions (Signed)
Continue taking the 1 mg tablet on Sunday, Monday, Wednesday and Friday and 2 mg tablet on Tuesday, Thursday and Saturday. INR in 6 weeks     [phone]

## 2022-11-22 ENCOUNTER — Ambulatory Visit: Payer: Medicare Other

## 2022-12-13 ENCOUNTER — Ambulatory Visit: Payer: Medicare Other | Admitting: Cardiology

## 2022-12-16 ENCOUNTER — Ambulatory Visit: Payer: Medicare Other | Admitting: *Deleted

## 2022-12-16 VITALS — Ht 63.0 in | Wt 145.0 lb

## 2022-12-16 DIAGNOSIS — Z0000 Encounter for general adult medical examination without abnormal findings: Secondary | ICD-10-CM

## 2022-12-16 NOTE — Progress Notes (Addendum)
Subjective:   Ana Cruz is a 89 y.o. female who presents for Medicare Annual (Subsequent) preventive examination.  Visit Complete: Virtual  I connected with  Ana Cruz on 12/16/22 by a audio enabled telemedicine application and verified that I am speaking with the correct person using two identifiers.  Patient Location: Home  Provider Location: Office/Clinic  I discussed the limitations of evaluation and management by telemedicine. The patient expressed understanding and agreed to proceed.  Review of Systems     Cardiac Risk Factors include: advanced age (>62men, >57 women);hypertension;dyslipidemia;Other (see comment), Risk factor comments: AFib     Objective:    Today's Vitals   12/16/22 1506  Weight: 145 lb (65.8 kg)  Height: 5\' 3"  (1.6 m)   Body mass index is 25.69 kg/m.     12/16/2022    3:19 PM 11/20/2021   10:33 AM 11/17/2020   11:34 AM 06/12/2020    4:31 PM 11/17/2019   11:45 AM 02/22/2019   12:41 PM 11/16/2018   11:48 AM  Advanced Directives  Does Patient Have a Medical Advance Directive? Yes Yes Yes No Yes Yes Yes  Type of Estate agent of [place];Living will Healthcare Power of [place];Living will Healthcare Power of [place];Living will  Healthcare Power of [place];Living will  Healthcare Power of [place];Living will  Does patient want to make changes to medical advance directive? No - Patient declined No - Patient declined No - Patient declined  No - Patient declined  No - Patient declined  Copy of Healthcare Power of Attorney in Chart? Yes - validated most recent copy scanned in chart (See row information) Yes - validated most recent copy scanned in chart (See row information) Yes - validated most recent copy scanned in chart (See row information)  No - copy requested  No - copy requested  Would patient like information on creating a medical advance directive?    No - Patient declined       Current Medications  (verified) Outpatient Encounter Medications as of 12/16/2022  Medication Sig   acetaminophen (TYLENOL) 500 MG tablet Take 500 mg by mouth every 6 (six) hours as needed (As needed for pain).   amLODipine (NORVASC) 5 MG tablet TAKE 1 TABLET (5 MG TOTAL) BY MOUTH DAILY.   amoxicillin (AMOXIL) 500 MG capsule Take 500 mg by mouth daily. For dental procedures   Artificial Tear Ointment (EYE LUBRICANT OP) Apply 1 drop to eye 2 (two) times daily.   Cholecalciferol (VITAMIN D3) 1000 UNITS CAPS Take 1,000 Units by mouth daily.   levothyroxine (SYNTHROID) 75 MCG tablet TAKE 1 TABLET BY MOUTH EVERY DAY   metoprolol succinate (TOPROL XL) 25 MG 24 hr tablet Take 0.5 tablets (12.5 mg total) by mouth daily.   Probiotic Product (PROBIOTIC-10) CHEW Chew 1 tablet by mouth daily.   rosuvastatin (CRESTOR) 20 MG tablet Take 1 tablet (20 mg total) by mouth daily.   telmisartan (MICARDIS) 80 MG tablet Take 80 mg by mouth daily.   vitamin B-12 (CYANOCOBALAMIN) 1000 MCG tablet Take 1 tablet (1,000 mcg total) by mouth daily.   vitamin E 180 MG (400 UNITS) capsule Take 800 Units by mouth daily.   warfarin (COUMADIN) 1 MG tablet Take 1 mg by mouth daily. Patient takes 1 mg 4 x a week then 2 mg on the other 3 days of the week and gets checked regularly   fluticasone (FLONASE) 50 MCG/ACT nasal spray Place 1 spray into both nostrils daily.   No facility-administered  encounter medications on file as of 12/16/2022.    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   Past Surgical History:  Procedure Laterality Date   ABDOMINAL HYSTERECTOMY  1979   APPENDECTOMY  1969   BILATERAL OOPHORECTOMY     squential   BLADDER REPAIR  1980   OVARIAN CYST REMOVAL  April 1969   REPLACEMENT TOTAL KNEE BILATERAL  2009   reverse shoulder surgery  04/05/2016   ROTATOR CUFF REPAIR  Jan 2000   ROTATOR CUFF REPAIR  2000   Family History  Problem Relation Age of Onset   Hypertension Mother    Dementia Mother    Heart disease Father 66       MI   Cancer Paternal Grandfather 11       colon   Breast cancer Neg Hx    Social History   Socioeconomic History   Marital status: Widowed    Spouse name: Not on file   Number of children: 4   Years of education: 13.5   Highest education level: Some college, no degree  Occupational History   Occupation: retired  Tobacco Use   Smoking status: Never   Smokeless tobacco: Never  Vaping Use   Vaping status: Never Used  Substance and Sexual Activity   Alcohol use: Not Currently   Drug use: No   Sexual activity: Not on file  Other Topics Concern   Not on file  Social History Narrative   Married    [place]ives [place]    Right handed   One story   Social Determinants of Health   Financial Resource Strain: Low Risk  (12/16/2022)   Overall Financial Resource Strain (CARDIA)    Difficulty of Paying Living Expenses: Not hard at all  Food Insecurity: No Food Insecurity (12/16/2022)   Hunger Vital Sign    Worried About Running Out of Food in the Last Year: Never true    Ran Out of Food in the Last Year: Never true  Transportation Needs: No Transportation Needs (12/16/2022)   PRAPARE - Administrator, Civil Service (Medical): No    Lack of Transportation (Non-Medical): No  Physical Activity: Insufficiently Active (12/16/2022)   Exercise Vital Sign    Days of Exercise per Week: 2 days    Minutes of Exercise  per Session: 30 min  Stress: No Stress Concern Present (12/16/2022)   [employer] of Occupational Health - Occupational Stress Questionnaire    Feeling of Stress : Not at all  Social Connections: Moderately Isolated (12/16/2022)   Social Connection and Isolation Panel [NHANES]    Frequency of Communication with Friends and Family: More than three times a week    Frequency of Social Gatherings with Friends and Family: More than three times a week    Attends Religious Services: More than 4 times per year    Active Member of Clubs or Organizations: No  Attends Banker Meetings: Never    Marital Status: Widowed    Tobacco Counseling Counseling given: Not Answered   Clinical Intake:  Pre-visit preparation completed: Yes  Pain : No/denies pain     BMI - recorded: 25.69 Nutritional Status: BMI 25 -29 Overweight Nutritional Risks: None Diabetes: No  How often do you need to have someone help you when you read instructions, pamphlets, or other written materials from your doctor or pharmacy?: 1 - Never  Interpreter Needed?: No  Information entered by :: R. Blakelyn Dinges LPN   Activities of Daily Living    12/16/2022    3:08 PM 12/10/2022    8:46 AM  In your present state of health, do you have any difficulty performing the following activities:  Hearing? 0 0  Vision? 0 0  Comment glasses   Difficulty concentrating or making decisions? 0 0  Walking or climbing stairs? 1 1  Comment uses a cane   Dressing or bathing? 0 0  Doing errands, shopping? 0 0  Preparing Food and eating ? N N  Using the Toilet? N N  In the past six months, have you accidently leaked urine? N N  Do you have problems with loss of bowel control? N N  Managing your Medications? N N  Managing your Finances? N N  Housekeeping or managing your Housekeeping? Y N  Comment Has some help with this     Patient Care Team: Sherlene Shams, MD as PCP - General (Internal Medicine) Debbe Odea,  MD as PCP - Cardiology (Cardiology) Glendale Chard, DO as Consulting Physician (Neurology)  Indicate any recent Medical Services you may have received from other than [facility] providers in the past year (date may be approximate).     Assessment:   This is a routine wellness examination for Alaira.  Hearing/Vision screen Hearing Screening - Comments:: No issues Vision Screening - Comments:: glasses  Dietary issues and exercise activities discussed:     Goals Addressed             This Visit's Progress    Patient Stated       None       Depression Screen    12/16/2022    3:13 PM 11/12/2022    2:16 PM 08/12/2022   10:18 AM 04/02/2022    4:20 PM 11/28/2021    3:13 PM 11/20/2021   10:28 AM 05/30/2021    3:13 PM  PHQ 2/9 Scores  PHQ - 2 Score 0 0 0 0 0 0 0  PHQ- 9 Score 0          Fall Risk    12/16/2022    3:16 PM 12/10/2022    8:46 AM 11/18/2022    8:42 AM 11/12/2022    2:15 PM 08/12/2022   10:18 AM  Fall Risk   Falls in the past year? 0 0 0 0 0  Number falls in past yr: 0   0 0  Injury with Fall? 0   0 0  Risk for fall due to : No Fall Risks   No Fall Risks No Fall Risks  Follow up Falls prevention discussed;Falls evaluation completed   Falls evaluation completed Falls evaluation completed    MEDICARE RISK AT HOME:  Medicare Risk at Home - 12/16/22 1516     Any stairs in or around the home? Yes    If so, are there any without handrails? Yes    Home free of loose throw rugs in  walkways, pet beds, electrical cords, etc? Yes    Adequate lighting in your home to reduce risk of falls? Yes    Life alert? Yes    Use of a cane, walker or w/c? Yes   cane   Grab bars in the bathroom? Yes    Shower chair or bench in shower? No    Elevated toilet seat or a handicapped toilet? Yes              Cognitive Function:        12/16/2022    3:20 PM 11/17/2019   12:02 PM 11/16/2018   11:52 AM 09/10/2016    1:47 PM  6CIT Screen  What Year? 0 points 0 points 0 points 0  points  What month? 0 points 0 points 0 points 0 points  What time? 0 points  0 points 0 points  Count back from 20 0 points  0 points 0 points  Months in reverse 0 points 0 points 0 points 0 points  Repeat phrase 0 points 0 points 0 points 0 points  Total Score 0 points  0 points 0 points    Immunizations Immunization History  Administered Date(s) Administered   Influenza Split 03/31/2012, 04/01/2013, 02/22/2014   Influenza Whole 03/05/2011   Influenza, High Dose Seasonal PF 02/20/2016, 03/14/2017   Influenza,inj,Quad PF,6+ Mos 02/21/2015   Influenza-Unspecified 03/19/2018, 03/18/2019, 03/02/2020, 03/30/2021   Moderna Covid-19 Vaccine Bivalent Booster 20yrs & up 02/28/2021   Moderna Sars-Covid-2 Vaccination 06/18/2019, 07/16/2019, 04/18/2020, 10/19/2020   Pneumococcal Conjugate-13 08/02/2013   Pneumococcal Polysaccharide-23 04/09/1999, 04/09/2011   Tdap 09/19/2008, 08/14/2021   Zoster Recombinant(Shingrix) 02/15/2019, 03/17/2019   Zoster, Live 01/20/2007    TDAP status: Up to date  Flu Vaccine status: Up to date Patient stated she had at [facility] and will get the date  Pneumococcal vaccine status: Up to date  Covid-19 vaccine status: Completed vaccines  Qualifies for Shingles Vaccine? Yes   Zostavax completed Yes   Shingrix Completed?: Yes  Screening Tests Health Maintenance  Topic Date Due   COVID-19 Vaccine (6 - 2023-24 season) 02/01/2022   Medicare Annual Wellness (AWV)  11/21/2022   INFLUENZA VACCINE  01/02/2023   DTaP/Tdap/Td (3 - Td or Tdap) 08/15/2031   Pneumonia Vaccine 21+ Years old  Completed   DEXA SCAN  Completed   Zoster Vaccines- Shingrix  Completed   HPV VACCINES  Aged Out    Health Maintenance  Health Maintenance Due  Topic Date Due   COVID-19 Vaccine (6 - 2023-24 season) 02/01/2022   Medicare Annual Wellness (AWV)  11/21/2022    Colorectal cancer screening: No longer required. Wants to continue Cologuard  Mammogram status: Completed  2024. Repeat every year  Bone Density status: Completed 2. Results reflect: Bone density results: OSTEOPENIA. Repeat every 2 years. Wants to discuss with PCP at next appointment  Lung Cancer Screening: (Low Dose CT Chest recommended if Age 48-80 years, 20 pack-year currently smoking OR have quit w/in 15years.) does not qualify.     Additional Screening:  Hepatitis C Screening: does not qualify; Completed not needed because of age  Vision Screening: Recommended annual ophthalmology exams for early detection of glaucoma and other disorders of the eye. Is the patient up to date with their annual eye exam?  No  Who is the provider or what is the name of the office in which the patient attends annual eye exams?  Eye If pt is not established with a provider, would they like to be referred  to a provider to establish care? No .   Dental Screening: Recommended annual dental exams for proper oral hygiene    Community Resource Referral / Chronic Care Management: CRR required this visit?  No   CCM required this visit?  No     Plan:     I have personally reviewed and noted the following in the patient's chart:   Medical and social history Use of alcohol, tobacco or illicit drugs  Current medications and supplements including opioid prescriptions. Patient is not currently taking opioid prescriptions. Functional ability and status Nutritional status Physical activity Advanced directives List of other physicians Hospitalizations, surgeries, and ER visits in previous 12 months Vitals Screenings to include cognitive, depression, and falls Referrals and appointments  In addition, I have reviewed and discussed with patient certain preventive protocols, quality metrics, and best practice recommendations. A written personalized care plan for preventive services as well as general preventive health recommendations were provided to patient.     Sydell Axon, LPN   1/61/0960   After  Visit Summary: (MyChart) Due to this being a telephonic visit, the after visit summary with patients personalized plan was offered to patient via MyChart   Nurse Notes: None      I have reviewed the above information and agree with above.   Duncan Dull, MD

## 2022-12-16 NOTE — Patient Instructions (Signed)
Ana Cruz , Thank you for taking time to come for your Medicare Wellness Visit. I appreciate your ongoing commitment to your health goals. Please review the following plan we discussed and let me know if I can assist you in the future.   These are the goals we discussed:  Goals       care management activities      Care Coordination Interventions: Care Management program discussed and offered Patient appreciated the call, however would like to discuss program offerings with her provider Patient scheduled with PCP on 05/30/22 AWV confirmed 11/22/22         Patient Stated      None      Weight goal 145lb (pt-stated)      Portion control  Reduce sugar intake         This is a list of the screening recommended for you and due dates:  Health Maintenance  Topic Date Due   COVID-19 Vaccine (6 - 2023-24 season) 02/01/2022   Flu Shot  01/02/2023   Medicare Annual Wellness Visit  12/16/2023   DTaP/Tdap/Td vaccine (3 - Td or Tdap) 08/15/2031   Pneumonia Vaccine  Completed   DEXA scan (bone density measurement)  Completed   Zoster (Shingles) Vaccine  Completed   HPV Vaccine  Aged Out    Advanced directives: On file  Conditions/risks identified: None  Next appointment: Follow up in one year for your annual wellness visit 12/23/23 @ 1:00   Preventive Care 65 Years and Older, Female Preventive care refers to lifestyle choices and visits with your health care provider that can promote health and wellness. What does preventive care include? A yearly physical exam. This is also called an annual well check. Dental exams once or twice a year. Routine eye exams. Ask your health care provider how often you should have your eyes checked. Personal lifestyle choices, including: Daily care of your teeth and gums. Regular physical activity. Eating a healthy diet. Avoiding tobacco and drug use. Limiting alcohol use. Practicing safe sex. Taking low-dose aspirin every day. Taking  vitamin and mineral supplements as recommended by your health care provider. What happens during an annual well check? The services and screenings done by your health care provider during your annual well check will depend on your age, overall health, lifestyle risk factors, and family history of disease. Counseling  Your health care provider may ask you questions about your: Alcohol use. Tobacco use. Drug use. Emotional well-being. Home and relationship well-being. Sexual activity. Eating habits. History of falls. Memory and ability to understand (cognition). Work and work Astronomer. Reproductive health. Screening  You may have the following tests or measurements: Height, weight, and BMI. Blood pressure. Lipid and cholesterol levels. These may be checked every 5 years, or more frequently if you are over 85 years old. Skin check. Lung cancer screening. You may have this screening every year starting at age 46 if you have a 30-pack-year history of smoking and currently smoke or have quit within the past 15 years. Fecal occult blood test (FOBT) of the stool. You may have this test every year starting at age 47. Flexible sigmoidoscopy or colonoscopy. You may have a sigmoidoscopy every 5 years or a colonoscopy every 10 years starting at age 71. Hepatitis C blood test. Hepatitis B blood test. Sexually transmitted disease (STD) testing. Diabetes screening. This is done by checking your blood sugar (glucose) after you have not eaten for a while (fasting). You may have this done every 1-3  years. Bone density scan. This is done to screen for osteoporosis. You may have this done starting at age 73. Mammogram. This may be done every 1-2 years. Talk to your health care provider about how often you should have regular mammograms. Talk with your health care provider about your test results, treatment options, and if necessary, the need for more tests. Vaccines  Your health care provider may  recommend certain vaccines, such as: Influenza vaccine. This is recommended every year. Tetanus, diphtheria, and acellular pertussis (Tdap, Td) vaccine. You may need a Td booster every 10 years. Zoster vaccine. You may need this after age 80. Pneumococcal 13-valent conjugate (PCV13) vaccine. One dose is recommended after age 49. Pneumococcal polysaccharide (PPSV23) vaccine. One dose is recommended after age 37. Talk to your health care provider about which screenings and vaccines you need and how often you need them. This information is not intended to replace advice given to you by your health care provider. Make sure you discuss any questions you have with your health care provider. Document Released: 06/16/2015 Document Revised: 02/07/2016 Document Reviewed: 03/21/2015 Elsevier Interactive Patient Education  2017 [employer].  Fall Prevention in the Home Falls can cause injuries. They can happen to people of all ages. There are many things you can do to make your home safe and to help prevent falls. What can I do on the outside of my home? Regularly fix the edges of walkways and driveways and fix any cracks. Remove anything that might make you trip as you walk through a door, such as a raised step or threshold. Trim any bushes or trees on the path to your home. Use bright outdoor lighting. Clear any walking paths of anything that might make someone trip, such as rocks or tools. Regularly check to see if handrails are loose or broken. Make sure that both sides of any steps have handrails. Any raised decks and porches should have guardrails on the edges. Have any leaves, snow, or ice cleared regularly. Use sand or salt on walking paths during winter. Clean up any spills in your garage right away. This includes oil or grease spills. What can I do in the bathroom? Use night lights. Install grab bars by the toilet and in the tub and shower. Do not use towel bars as grab bars. Use non-skid  mats or decals in the tub or shower. If you need to sit down in the shower, use a plastic, non-slip stool. Keep the floor dry. Clean up any water that spills on the floor as soon as it happens. Remove soap buildup in the tub or shower regularly. Attach bath mats securely with double-sided non-slip rug tape. Do not have throw rugs and other things on the floor that can make you trip. What can I do in the bedroom? Use night lights. Make sure that you have a light by your bed that is easy to reach. Do not use any sheets or blankets that are too big for your bed. They should not hang down onto the floor. Have a firm chair that has side arms. You can use this for support while you get dressed. Do not have throw rugs and other things on the floor that can make you trip. What can I do in the kitchen? Clean up any spills right away. Avoid walking on wet floors. Keep items that you use a lot in easy-to-reach places. If you need to reach something above you, use a strong step stool that has a grab  bar. Keep electrical cords out of the way. Do not use floor polish or wax that makes floors slippery. If you must use wax, use non-skid floor wax. Do not have throw rugs and other things on the floor that can make you trip. What can I do with my stairs? Do not leave any items on the stairs. Make sure that there are handrails on both sides of the stairs and use them. Fix handrails that are broken or loose. Make sure that handrails are as long as the stairways. Check any carpeting to make sure that it is firmly attached to the stairs. Fix any carpet that is loose or worn. Avoid having throw rugs at the top or bottom of the stairs. If you do have throw rugs, attach them to the floor with carpet tape. Make sure that you have a light switch at the top of the stairs and the bottom of the stairs. If you do not have them, ask someone to add them for you. What else can I do to help prevent falls? Wear shoes  that: Do not have high heels. Have rubber bottoms. Are comfortable and fit you well. Are closed at the toe. Do not wear sandals. If you use a stepladder: Make sure that it is fully opened. Do not climb a closed stepladder. Make sure that both sides of the stepladder are locked into place. Ask someone to hold it for you, if possible. Clearly mark and make sure that you can see: Any grab bars or handrails. First and last steps. Where the edge of each step is. Use tools that help you move around (mobility aids) if they are needed. These include: Canes. Walkers. Scooters. Crutches. Turn on the lights when you go into a dark area. Replace any light bulbs as soon as they burn out. Set up your furniture so you have a clear path. Avoid moving your furniture around. If any of your floors are uneven, fix them. If there are any pets around you, be aware of where they are. Review your medicines with your doctor. Some medicines can make you feel dizzy. This can increase your chance of falling. Ask your doctor what other things that you can do to help prevent falls. This information is not intended to replace advice given to you by your health care provider. Make sure you discuss any questions you have with your health care provider. Document Released: 03/16/2009 Document Revised: 10/26/2015 Document Reviewed: 06/24/2014 Elsevier Interactive Patient Education  2017 [employer].  Per patient no change in vitals since last visit, unable to obtain new vitals due to telehealth visit

## 2023-01-01 ENCOUNTER — Ambulatory Visit: Payer: Medicare Other

## 2023-01-01 DIAGNOSIS — I480 Paroxysmal atrial fibrillation: Secondary | ICD-10-CM | POA: Diagnosis not present

## 2023-01-01 DIAGNOSIS — Z7901 Long term (current) use of anticoagulants: Secondary | ICD-10-CM | POA: Insufficient documentation

## 2023-01-01 LAB — POCT INR: INR: 2.7 (ref 2.0–3.0)

## 2023-01-01 NOTE — Patient Instructions (Signed)
Continue taking the 1 mg tablet on Sunday, Monday, Wednesday and Friday and 2 mg tablet on Tuesday, Thursday and Saturday. INR in 6 weeks     [phone]

## 2023-01-21 ENCOUNTER — Other Ambulatory Visit: Payer: Self-pay

## 2023-01-21 ENCOUNTER — Telehealth: Payer: Self-pay | Admitting: Cardiology

## 2023-01-21 MED ORDER — WARFARIN SODIUM 1 MG PO TABS: ORAL_TABLET | ORAL | 1 refills | Status: DC

## 2023-01-21 NOTE — Telephone Encounter (Signed)
 *  STAT* If patient is at the pharmacy, call can be transferred to refill team.   1. Which medications need to be refilled? (please list name of each medication and dose if known) warfarin (COUMADIN) 1 MG tablet    2. Would you like to learn more about the convenience, safety, & potential cost savings by using the [facility]? No      3. Are you open to using the [facility] (Type [facility]. No    4. Which pharmacy/location (including street and city if local pharmacy) is medication to be sent to? [id number] [address], [place] [phone]     5. Do they need a 30 day or 90 day supply? 90 days

## 2023-01-21 NOTE — Telephone Encounter (Signed)
Refill request

## 2023-02-03 ENCOUNTER — Other Ambulatory Visit: Payer: Self-pay | Admitting: Internal Medicine

## 2023-02-12 ENCOUNTER — Ambulatory Visit: Payer: Medicare Other | Attending: Cardiology

## 2023-02-12 DIAGNOSIS — Z7901 Long term (current) use of anticoagulants: Secondary | ICD-10-CM | POA: Diagnosis present

## 2023-02-12 DIAGNOSIS — I480 Paroxysmal atrial fibrillation: Secondary | ICD-10-CM | POA: Insufficient documentation

## 2023-02-12 LAB — POCT INR: INR: 3.5 — AB (ref 2.0–3.0)

## 2023-02-12 NOTE — Patient Instructions (Signed)
HOLD TONIGHT ONLY THEN Continue taking the 1 mg tablet on Sunday, Monday, Wednesday and Friday and 2 mg tablet on Tuesday, Thursday and Saturday. INR in 3 weeks     [phone]

## 2023-02-16 ENCOUNTER — Other Ambulatory Visit: Payer: Self-pay | Admitting: Internal Medicine

## 2023-02-25 ENCOUNTER — Ambulatory Visit: Payer: Medicare Other | Admitting: Family Medicine

## 2023-02-25 ENCOUNTER — Ambulatory Visit (INDEPENDENT_AMBULATORY_CARE_PROVIDER_SITE_OTHER): Payer: Medicare Other

## 2023-02-25 ENCOUNTER — Ambulatory Visit (INDEPENDENT_AMBULATORY_CARE_PROVIDER_SITE_OTHER): Payer: Medicare Other | Admitting: Family Medicine

## 2023-02-25 VITALS — BP 116/68 | HR 58 | Temp 98.2°F | Ht 63.0 in | Wt 151.8 lb

## 2023-02-25 DIAGNOSIS — M25552 Pain in left hip: Secondary | ICD-10-CM | POA: Insufficient documentation

## 2023-02-25 MED ORDER — HYDROCODONE-ACETAMINOPHEN 5-325 MG PO TABS: 1.0000 | ORAL_TABLET | Freq: Three times a day (TID) | ORAL | 0 refills | Status: DC | PRN

## 2023-02-25 NOTE — Assessment & Plan Note (Addendum)
Acute issue.  Discussed possibility of tendon tear versus labral tear.  Unlikely to be fracture.  We will get an x-ray today.  Will treat pain with Vicodin 5-325 mg every 8 hours as needed.  Discussed risk of constipation and drowsiness with this.  Encourage patient to use MiraLAX to help with constipation if that occurs.  Advised to discontinue use of hydrocodone if she is drowsy with this.  She we will discontinue Tylenol.  We will also refer her to orthopedics.  Advised if symptoms are not improving or if they worsen at all she should [facility].

## 2023-02-25 NOTE — Progress Notes (Signed)
Marikay Alar, MD Phone: [phone]  Ana Cruz is a 89 y.o. female who presents today for same-day visit.  Hip pain: Patient notes 2 days ago she got up from her sofa and felt a pop in her left hip and had 10 out of 10 pain.  Notes the pain is now 5 out of 10.  She has been doing Tylenol 1000 mg every 8 hours and is unsure if this is beneficial.  She notes no additional popping.  Her leg does not give out on her.  Notes it hurts in her left hip to walk forward though not backwards.  She does not want to take tramadol for this given prior adverse reaction.  She does not remember the adverse reaction.  Social History   Tobacco Use  Smoking Status Never  Smokeless Tobacco Never    Current Outpatient Medications on File Prior to Visit  Medication Sig Dispense Refill   acetaminophen (TYLENOL) 500 MG tablet Take 500 mg by mouth every 6 (six) hours as needed (As needed for pain).     amLODipine (NORVASC) 5 MG tablet TAKE 1 TABLET (5 MG TOTAL) BY MOUTH DAILY. 90 tablet 3   amoxicillin (AMOXIL) 500 MG capsule Take 500 mg by mouth daily. For dental procedures     Artificial Tear Ointment (EYE LUBRICANT OP) Apply 1 drop to eye 2 (two) times daily.     Cholecalciferol (VITAMIN D3) 1000 UNITS CAPS Take 1,000 Units by mouth daily.     levothyroxine (SYNTHROID) 75 MCG tablet TAKE 1 TABLET BY MOUTH EVERY DAY 90 tablet 1   metoprolol succinate (TOPROL XL) 25 MG 24 hr tablet Take 0.5 tablets (12.5 mg total) by mouth daily. 45 tablet 3   Probiotic Product (PROBIOTIC-10) CHEW Chew 1 tablet by mouth daily.     rosuvastatin (CRESTOR) 20 MG tablet TAKE 1 TABLET BY MOUTH EVERY DAY 90 tablet 3   telmisartan (MICARDIS) 80 MG tablet Take 80 mg by mouth daily.     vitamin B-12 (CYANOCOBALAMIN) 1000 MCG tablet Take 1 tablet (1,000 mcg total) by mouth daily. 90 tablet 3   vitamin E 180 MG (400 UNITS) capsule Take 800 Units by mouth daily.     warfarin (COUMADIN) 1 MG tablet Take 1-2 tablets Daily or  as prescribed by [facility] 90 tablet 1   fluticasone (FLONASE) 50 MCG/ACT nasal spray Place 1 spray into both nostrils daily. 16 g 0   No current facility-administered medications on file prior to visit.     ROS see history of present illness  Objective  Physical Exam Vitals:   02/25/23 1120  BP: 116/68  Pulse: (!) 58  Temp: 98.2 F (36.8 C)  SpO2: 96%    BP Readings from Last 3 Encounters:  02/25/23 116/68  11/18/22 (!) 122/56  11/12/22 (!) 128/52   Wt Readings from Last 3 Encounters:  02/25/23 151 lb 12.8 oz (68.9 kg)  12/16/22 145 lb (65.8 kg)  11/18/22 149 lb 6.4 oz (67.8 kg)    Physical Exam Musculoskeletal:     Comments: Good internal and external range of motion left hip, good flexion of left hip passively, 5/5 strength bilateral hips though patient does report more difficulty in the left hip, anterior left hip is nontender, there is no overlying bruising, Prince Solian, CMA served as Biomedical engineer.      Assessment/Plan: Please see individual problem list.  Left hip pain Assessment & Plan: Acute issue.  Discussed possibility of tendon tear versus labral tear.  Unlikely to be fracture.  We will get an x-ray today.  Will treat pain with Vicodin 5-325 mg every 8 hours as needed.  Discussed risk of constipation and drowsiness with this.  Encourage patient to use MiraLAX to help with constipation if that occurs.  Advised to discontinue use of hydrocodone if she is drowsy with this.  She we will discontinue Tylenol.  We will also refer her to orthopedics.  Advised if symptoms are not improving or if they worsen at all [facility] should [facility].  Orders: -     DG HIP UNILAT W OR W/O PELVIS 2-3 VIEWS LEFT; Future -     HYDROcodone-Acetaminophen; Take 1 tablet by mouth every 8 (eight) hours as needed for moderate pain.  Dispense: 10 tablet; Refill: 0 -     Ambulatory referral to Orthopedic Surgery    Return if symptoms worsen or  fail to improve.   Marikay Alar, MD [facility]

## 2023-02-25 NOTE — Patient Instructions (Signed)
Nice to see you. Will get an x-ray today and contact you with the results. You can take the hydrocodone 1 tablet every 8 hours as needed for moderate pain. Please do not take any Tylenol while you are on this. Orthopedic should contact you to schedule a visit.  If your pain is not improving or if it is worsening at all please go to the orthopedic walk-in clinic.

## 2023-03-05 ENCOUNTER — Ambulatory Visit: Payer: Medicare Other | Attending: Cardiology

## 2023-03-05 DIAGNOSIS — I480 Paroxysmal atrial fibrillation: Secondary | ICD-10-CM | POA: Insufficient documentation

## 2023-03-05 DIAGNOSIS — Z7901 Long term (current) use of anticoagulants: Secondary | ICD-10-CM | POA: Diagnosis not present

## 2023-03-05 LAB — POCT INR: INR: 4.3 — AB (ref 2.0–3.0)

## 2023-03-05 NOTE — Patient Instructions (Signed)
HOLD TODAY, THURSDAY and FRIDAY, THEN DECREASE TO 1mg  DAILY.  INR in 2 weeks.  Prednisone Taper for 5 days 10/2-10/6.     [phone]

## 2023-03-11 ENCOUNTER — Telehealth: Payer: Self-pay | Admitting: Cardiology

## 2023-03-11 MED ORDER — TELMISARTAN 80 MG PO TABS: 80.0000 mg | ORAL_TABLET | Freq: Every day | ORAL | 1 refills | Status: DC

## 2023-03-11 NOTE — Telephone Encounter (Signed)
*  STAT* If patient is at the pharmacy, call can be transferred to refill team.   1. Which medications need to be refilled? (please list name of each medication and dose if known)   telmisartan (MICARDIS) 80 MG tablet    2. Which pharmacy/location (including street and city if local pharmacy) is medication to be sent to?  [id number] [address], [place] [phone]      3. Do they need a 30 day or 90 day supply? 90 day

## 2023-03-12 ENCOUNTER — Encounter: Payer: Self-pay | Admitting: Internal Medicine

## 2023-03-17 NOTE — Telephone Encounter (Signed)
Patient called regarding the question that was asked on 10/9. Please advise

## 2023-03-19 ENCOUNTER — Ambulatory Visit: Payer: Medicare Other | Attending: Cardiology

## 2023-03-19 DIAGNOSIS — Z7901 Long term (current) use of anticoagulants: Secondary | ICD-10-CM | POA: Insufficient documentation

## 2023-03-19 DIAGNOSIS — I480 Paroxysmal atrial fibrillation: Secondary | ICD-10-CM | POA: Insufficient documentation

## 2023-03-19 LAB — POCT INR: INR: 1.5 — AB (ref 2.0–3.0)

## 2023-03-19 NOTE — Patient Instructions (Signed)
TAKE 2 TABLETS TODAY THEN CONTINUE 1mg  DAILY.  INR in 2 weeks.  Eat 3 helpings of greens per week.     [phone]

## 2023-03-30 ENCOUNTER — Other Ambulatory Visit: Payer: Self-pay | Admitting: Internal Medicine

## 2023-04-16 ENCOUNTER — Ambulatory Visit: Payer: Medicare Other | Attending: Cardiology

## 2023-04-16 DIAGNOSIS — Z7901 Long term (current) use of anticoagulants: Secondary | ICD-10-CM | POA: Diagnosis not present

## 2023-04-16 DIAGNOSIS — I480 Paroxysmal atrial fibrillation: Secondary | ICD-10-CM | POA: Diagnosis not present

## 2023-04-16 LAB — POCT INR: INR: 1.5 — AB (ref 2.0–3.0)

## 2023-04-16 NOTE — Patient Instructions (Signed)
TAKE 2 TABLETS TODAY THEN increase to 1mg  DAILY, EXCEPT 1.5 mg on Mondays AND Fridays.  INR in 2 weeks.  Eat 3 helpings of greens per week.     [phone]

## 2023-04-30 ENCOUNTER — Ambulatory Visit: Payer: Medicare Other | Attending: Cardiology

## 2023-04-30 DIAGNOSIS — Z7901 Long term (current) use of anticoagulants: Secondary | ICD-10-CM | POA: Diagnosis present

## 2023-04-30 DIAGNOSIS — I480 Paroxysmal atrial fibrillation: Secondary | ICD-10-CM | POA: Diagnosis not present

## 2023-04-30 LAB — POCT INR: INR: 2.2 (ref 2.0–3.0)

## 2023-04-30 NOTE — Patient Instructions (Signed)
Continue 1mg  DAILY, EXCEPT 1.5 mg on Mondays AND Fridays.  INR in 4 weeks.  Eat 3 helpings of greens per week.     [phone]

## 2023-05-08 ENCOUNTER — Ambulatory Visit (INDEPENDENT_AMBULATORY_CARE_PROVIDER_SITE_OTHER): Payer: Medicare Other | Admitting: Podiatry

## 2023-05-08 ENCOUNTER — Encounter: Payer: Self-pay | Admitting: Podiatry

## 2023-05-08 DIAGNOSIS — M79674 Pain in right toe(s): Secondary | ICD-10-CM | POA: Diagnosis not present

## 2023-05-08 DIAGNOSIS — S91115A Laceration without foreign body of left lesser toe(s) without damage to nail, initial encounter: Secondary | ICD-10-CM

## 2023-05-08 DIAGNOSIS — B351 Tinea unguium: Secondary | ICD-10-CM | POA: Diagnosis not present

## 2023-05-08 DIAGNOSIS — M79675 Pain in left toe(s): Secondary | ICD-10-CM

## 2023-05-11 NOTE — Progress Notes (Signed)
  Subjective:  Patient ID: Ana Cruz, female    DOB: [birth year],  MRN: [id number]  89 y.o. female presents painful elongated mycotic toenails 1-5 bilaterally which are tender when wearing enclosed shoe gear. Pain is relieved with periodic professional debridement.  New problem(s): Patient states she attempted to trim her toenails 2-3 days ago and nicked her left 2nd toe.  PCP is Ana Shams, MD , and last visit was November 12, 2022.  Allergies  Allergen Reactions   Pamelor [Nortriptyline] Other (See Comments)    Urinary incontinence   Chocolate Flavoring Agent (Non-Screening) Hives   Clindamycin/Lincomycin     Tongue swelled,  Throat felt funny   Onion Swelling    Throat swelling   Other Hives    Raw Onions, Spring Time causes eye itching and runny nose. *Pt. Also Had Large Blisters from Tourniquets applied to her legs during her knee replacement surgery*   Tape    Hctz [Hydrochlorothiazide] Rash    rash   Review of Systems: Negative except as noted in the HPI.   Objective:  Ana Cruz is a pleasant 89 y.o. female WD, WN in NAD.[name] AAO x 3.  Vascular Examination: Vascular status intact b/l with palpable pedal pulses. CFT immediate b/l. Pedal hair present. No edema. No pain with calf compression b/l. Skin temperature gradient WNL b/l. No varicosities noted. No cyanosis or clubbing noted.  Neurological Examination: Sensation grossly intact b/l with 10 gram monofilament. Vibratory sensation intact b/l.  Dermatological Examination: Healing laceration noted left 2nd digit. No erythema, no edema, no drainage.   Pedal skin with normal turgor, texture and tone b/l. No interdigital macerations noted. Toenails 1-5 b/l thick, discolored, elongated with subungual debris and pain on dorsal palpation. No hyperkeratotic lesions noted b/l.   Musculoskeletal Examination: Muscle strength 5/5 to b/l LE.  No pain, crepitus noted b/l. Hammertoes 2-5 b/l. Limited ROM 1st MPJ  b/l. Patient ambulates independently without assistive aids.   Radiographs: None  Last A1c:       No data to display          Assessment:   1. Pain due to onychomycosis of toenails of both feet   2. Laceration of second toe of left foot, initial encounter    Plan:  -Consent given for treatment as described below: -Examined patient. -Patient to apply Neosporin to left 2nd digit once daily. -Patient to continue soft, supportive shoe gear daily. -Toenails 1-5 b/l were debrided in length and girth with sterile nail nippers and dremel without iatrogenic bleeding.  -Patient/POA to call should there be question/concern in the interim.  Return in about 10 weeks (around 07/17/2023).  Ana Cruz, DPM      [place] LOCATION: [address] [place] [address]                   Office [phone]   [facility] LOCATION: [address] [place], [place] [address] Office [phone]

## 2023-05-12 ENCOUNTER — Other Ambulatory Visit (INDEPENDENT_AMBULATORY_CARE_PROVIDER_SITE_OTHER): Payer: Medicare Other

## 2023-05-12 DIAGNOSIS — E7800 Pure hypercholesterolemia, unspecified: Secondary | ICD-10-CM

## 2023-05-12 DIAGNOSIS — D6869 Other thrombophilia: Secondary | ICD-10-CM | POA: Diagnosis not present

## 2023-05-12 DIAGNOSIS — E039 Hypothyroidism, unspecified: Secondary | ICD-10-CM | POA: Diagnosis not present

## 2023-05-12 LAB — CBC WITH DIFFERENTIAL/PLATELET
Basophils Absolute: 0 10*3/uL (ref 0.0–0.1)
Basophils Relative: 0.4 % (ref 0.0–3.0)
Eosinophils Absolute: 0.3 10*3/uL (ref 0.0–0.7)
Eosinophils Relative: 5.4 % — ABNORMAL HIGH (ref 0.0–5.0)
HCT: 31.2 % — ABNORMAL LOW (ref 36.0–46.0)
Hemoglobin: 10.1 g/dL — ABNORMAL LOW (ref 12.0–15.0)
Lymphocytes Relative: 27.4 % (ref 12.0–46.0)
Lymphs Abs: 1.5 10*3/uL (ref 0.7–4.0)
MCHC: 32.4 g/dL (ref 30.0–36.0)
MCV: 96.8 fL (ref 78.0–100.0)
Monocytes Absolute: 0.5 10*3/uL (ref 0.1–1.0)
Monocytes Relative: 9.8 % (ref 3.0–12.0)
Neutro Abs: 3.1 10*3/uL (ref 1.4–7.7)
Neutrophils Relative %: 57 % (ref 43.0–77.0)
Platelets: 194 10*3/uL (ref 150.0–400.0)
RBC: 3.22 Mil/uL — ABNORMAL LOW (ref 3.87–5.11)
RDW: 14.6 % (ref 11.5–15.5)
WBC: 5.5 10*3/uL (ref 4.0–10.5)

## 2023-05-12 LAB — COMPREHENSIVE METABOLIC PANEL
ALT: 13 U/L (ref 0–35)
AST: 22 U/L (ref 0–37)
Albumin: 3.8 g/dL (ref 3.5–5.2)
Alkaline Phosphatase: 80 U/L (ref 39–117)
BUN: 35 mg/dL — ABNORMAL HIGH (ref 6–23)
CO2: 25 meq/L (ref 19–32)
Calcium: 9 mg/dL (ref 8.4–10.5)
Chloride: 106 meq/L (ref 96–112)
Creatinine, Ser: 1.06 mg/dL (ref 0.40–1.20)
GFR: 47.13 mL/min — ABNORMAL LOW (ref >60.00)
Glucose, Bld: 91 mg/dL (ref 70–99)
Potassium: 4.1 meq/L (ref 3.5–5.1)
Sodium: 140 meq/L (ref 135–145)
Total Bilirubin: 0.4 mg/dL (ref 0.2–1.2)
Total Protein: 6.8 g/dL (ref 6.0–8.3)

## 2023-05-12 LAB — TSH: TSH: 1.79 u[IU]/mL (ref 0.35–5.50)

## 2023-05-13 LAB — LIPID PANEL W/REFLEX DIRECT LDL
Cholesterol: 136 mg/dL (ref <200)
HDL: 72 mg/dL (ref >=50)
LDL Cholesterol (Calc): 51 mg/dL
Non-HDL Cholesterol (Calc): 64 mg/dL (ref <130)
Total CHOL/HDL Ratio: 1.9 (calc) (ref <5.0)
Triglycerides: 53 mg/dL (ref <150)

## 2023-05-14 ENCOUNTER — Encounter: Payer: Self-pay | Admitting: Internal Medicine

## 2023-05-14 ENCOUNTER — Ambulatory Visit: Payer: Medicare Other | Admitting: Internal Medicine

## 2023-05-14 VITALS — BP 136/58 | HR 76 | Ht 63.0 in | Wt 151.0 lb

## 2023-05-14 DIAGNOSIS — M25552 Pain in left hip: Secondary | ICD-10-CM | POA: Diagnosis not present

## 2023-05-14 DIAGNOSIS — M5416 Radiculopathy, lumbar region: Secondary | ICD-10-CM

## 2023-05-14 DIAGNOSIS — D631 Anemia in chronic kidney disease: Secondary | ICD-10-CM | POA: Diagnosis not present

## 2023-05-14 DIAGNOSIS — M48061 Spinal stenosis, lumbar region without neurogenic claudication: Secondary | ICD-10-CM

## 2023-05-14 DIAGNOSIS — I10 Essential (primary) hypertension: Secondary | ICD-10-CM

## 2023-05-14 DIAGNOSIS — Z1231 Encounter for screening mammogram for malignant neoplasm of breast: Secondary | ICD-10-CM

## 2023-05-14 DIAGNOSIS — R7303 Prediabetes: Secondary | ICD-10-CM | POA: Diagnosis not present

## 2023-05-14 DIAGNOSIS — N1831 Chronic kidney disease, stage 3a: Secondary | ICD-10-CM | POA: Diagnosis not present

## 2023-05-14 DIAGNOSIS — F4320 Adjustment disorder, unspecified: Secondary | ICD-10-CM

## 2023-05-14 DIAGNOSIS — F4321 Adjustment disorder with depressed mood: Secondary | ICD-10-CM

## 2023-05-14 LAB — B12 AND FOLATE PANEL
Folate: 16.7 ng/mL (ref >5.9)
Vitamin B-12: 1364 pg/mL — ABNORMAL HIGH (ref 211–911)

## 2023-05-14 NOTE — Assessment & Plan Note (Signed)
Improving with PT for tendonitis.  Groin area.  Using heat

## 2023-05-14 NOTE — Progress Notes (Unsigned)
Subjective:  Patient ID: Ana Cruz, female    DOB: 1933/06/13  Age: 89 y.o. MRN: [id number]  CC: The primary encounter diagnosis was Encounter for screening mammogram for malignant neoplasm of breast. Diagnoses of Left hip pain, Anemia due to stage 3a chronic kidney disease (HCC), Spinal stenosis of lumbar region with radiculopathy, Prediabetes, Primary hypertension, and Grief reaction were also pertinent to this visit.   HPI Ana Cruz presents for  Chief Complaint  Patient presents with   Medical Management of Chronic Issues    6 month follow up    1) left hip tendonitis: completed PT and doing the exercises at home .  Slow but steady recovery   2) anemia : new onset .  Asymptomatic.  Ot donating blood  3)chronic atrial fibrillation :  tolerating  chronic anticoagulation: she has had difficulty regulating her INR in the past several months but more recently has had stability of INR   4) hypothyroid:  taking levothyroxine   5) Hypertension: patient checking her  blood pressure twice weekly at home.  Readings have been for the most part <130/80 at rest . Patient is following a reduced salt diet most days and is taking medications as prescribed       Outpatient Medications Prior to Visit  Medication Sig Dispense Refill   acetaminophen (TYLENOL) 500 MG tablet Take 500 mg by mouth every 6 (six) hours as needed (As needed for pain).     amLODipine (NORVASC) 5 MG tablet TAKE 1 TABLET (5 MG TOTAL) BY MOUTH DAILY. 90 tablet 3   amoxicillin (AMOXIL) 500 MG capsule Take 500 mg by mouth daily. For dental procedures     Artificial Tear Ointment (EYE LUBRICANT OP) Apply 1 drop to eye 2 (two) times daily.     Cholecalciferol (VITAMIN D3) 1000 UNITS CAPS Take 1,000 Units by mouth daily.     fluticasone (FLONASE) 50 MCG/ACT nasal spray Place 1 spray into both nostrils daily. 16 g 0   levothyroxine (SYNTHROID) 75 MCG tablet TAKE 1 TABLET BY MOUTH EVERY DAY 90 tablet 1    metoprolol succinate (TOPROL XL) 25 MG 24 hr tablet Take 0.5 tablets (12.5 mg total) by mouth daily. 45 tablet 3   Probiotic Product (PROBIOTIC-10) CHEW Chew 1 tablet by mouth daily.     rosuvastatin (CRESTOR) 20 MG tablet TAKE 1 TABLET BY MOUTH EVERY DAY 90 tablet 3   telmisartan (MICARDIS) 80 MG tablet Take 1 tablet (80 mg total) by mouth daily. 90 tablet 1   vitamin B-12 (CYANOCOBALAMIN) 1000 MCG tablet Take 1 tablet (1,000 mcg total) by mouth daily. 90 tablet 3   vitamin E 180 MG (400 UNITS) capsule Take 800 Units by mouth daily.     warfarin (COUMADIN) 1 MG tablet Take 1-2 tablets Daily or as prescribed by [facility] 90 tablet 1   HYDROcodone-acetaminophen (NORCO/VICODIN) 5-325 MG tablet Take 1 tablet by mouth every 8 (eight) hours as needed for moderate pain. 10 tablet 0   No facility-administered medications prior to visit.    Review of Systems;  Patient denies headache, fevers, malaise, unintentional weight loss, skin rash, eye pain, sinus congestion and sinus pain, sore throat, dysphagia,  hemoptysis , cough, dyspnea, wheezing, chest pain, palpitations, orthopnea, edema, abdominal pain, nausea, melena, diarrhea, constipation, flank pain, dysuria, hematuria, urinary  Frequency, nocturia, numbness, tingling, seizures,  Focal weakness, Loss of consciousness,  Tremor, insomnia, depression, anxiety, and suicidal ideation.      Objective:  BP (!) 136/58  Pulse 76   Ht 5\' 3"  (1.6 m)   Wt 151 lb (68.5 kg)   SpO2 97%   BMI 26.75 kg/m   BP Readings from Last 3 Encounters:  05/14/23 (!) 136/58  02/25/23 116/68  11/18/22 (!) 122/56    Wt Readings from Last 3 Encounters:  05/14/23 151 lb (68.5 kg)  02/25/23 151 lb 12.8 oz (68.9 kg)  12/16/22 145 lb (65.8 kg)    Physical Exam  Lab Results  Component Value Date   HGBA1C 6.0 11/28/2021   HGBA1C 5.7 (H) 06/29/2021   HGBA1C 5.7 05/22/2020    Lab Results  Component Value Date   CREATININE 1.06 05/12/2023   CREATININE  1.24 (H) 11/08/2022   CREATININE 1.08 05/30/2022    Lab Results  Component Value Date   WBC 5.5 05/12/2023   HGB 10.1 (L) 05/12/2023   HCT 31.2 (L) 05/12/2023   PLT 194.0 05/12/2023   GLUCOSE 91 05/12/2023   CHOL 136 05/12/2023   TRIG 53 05/12/2023   HDL 72 05/12/2023   LDLDIRECT 114.0 05/30/2022   LDLCALC 51 05/12/2023   ALT 13 05/12/2023   AST 22 05/12/2023   NA 140 05/12/2023   K 4.1 05/12/2023   CL 106 05/12/2023   CREATININE 1.06 05/12/2023   BUN 35 (H) 05/12/2023   CO2 25 05/12/2023   TSH 1.79 05/12/2023   INR 2.2 04/30/2023   HGBA1C 6.0 11/28/2021   MICROALBUR <0.7 12/17/2021    MM 3D SCREENING MAMMOGRAM BILATERAL BREAST  Result Date: 09/02/2022 CLINICAL DATA:  Screening. EXAM: DIGITAL SCREENING BILATERAL MAMMOGRAM WITH TOMOSYNTHESIS AND CAD TECHNIQUE: Bilateral screening digital craniocaudal and mediolateral oblique mammograms were obtained. Bilateral screening digital breast tomosynthesis was performed. The images were evaluated with computer-aided detection. COMPARISON:  Previous exam(s). ACR Breast Density Category b: There are scattered areas of fibroglandular density. FINDINGS: There are no findings suspicious for malignancy. IMPRESSION: No mammographic evidence of malignancy. A result letter of this screening mammogram will be mailed directly to the patient. RECOMMENDATION: Screening mammogram in one year. (Code:SM-B-01Y) BI-RADS CATEGORY  1: Negative. Electronically Signed   By: Baird Lyons M.D.   On: 09/02/2022 15:18    Assessment & Plan:  .Encounter for screening mammogram for malignant neoplasm of breast -     3D Screening Mammogram, Left and Right; Future  Left hip pain Assessment & Plan: Improving with PT for tendonitis.  Groin area.  Using heat    Anemia due to stage 3a chronic kidney disease (HCC) Assessment & Plan: 1 pt drop noted. .  Iron  b12 folate are normal.  .  She denies any bleeding and no longer donates blood .  Lab Results  Component  Value Date   IRON 71 05/14/2023   TIBC 279 05/14/2023   FERRITIN 134 05/14/2023   Lab Results  Component Value Date   VITAMINB12 1,364 (H) 05/14/2023   Lab Results  Component Value Date   FOLATE 16.7 05/14/2023     Orders: -     Iron, TIBC and Ferritin Panel -     B12 and Folate Panel -     Erythropoietin  Spinal stenosis of lumbar region with radiculopathy Assessment & Plan: She has a history of lumbar radiculitis that resolved in 2016 with prednisone and tramadol, prescribed by Dr Yves Dill , and did not receive an ESI.  Last MRI of lumbar spine was in 2016 and noted borderline mild central stenosis at L3 . The last time she developed lower extremity weakness ,  she benefitted  From PT at [facility] to improve her balance and proximal muscle strength. She is encouraged to continue walking in the pool three times per week and using a recumbent bike two times weekly to maintain proximal muscle strength.    Prediabetes Assessment & Plan: Her  random glucose and A1c  suggest she remains at risk for developing diabetes.  I recommend  that she continue to follow a low glycemic index diet and particpate regularly in an aerobic  exercise activity.     Primary hypertension Assessment & Plan: Well controlled on metoprolol,  amlodipine and telmisartan regimen. Renal function stable, no changes today.  Lab Results  Component Value Date   CREATININE 1.06 05/12/2023   Lab Results  Component Value Date   NA 140 05/12/2023   K 4.1 05/12/2023   CL 106 05/12/2023   CO2 25 05/12/2023      Grief reaction Assessment & Plan: Secondary to loss of husband Greggory Stallion in December 2023 .  Her children have continue to be supportive and involved in her life      Follow-up: No follow-ups on file.   Sherlene Shams, MD

## 2023-05-14 NOTE — Assessment & Plan Note (Signed)
1 pt drop noted. .  Iron  b12 folate are normal.  .  She denies any bleeding and no longer donates blood .  Lab Results  Component Value Date   IRON 71 05/14/2023   TIBC 279 05/14/2023   FERRITIN 134 05/14/2023   Lab Results  Component Value Date   VITAMINB12 1,364 (H) 05/14/2023   Lab Results  Component Value Date   FOLATE 16.7 05/14/2023

## 2023-05-14 NOTE — Patient Instructions (Addendum)
YOUR MAMMOGRAM IS DUE in March 2025, PLEASE CALL AND GET THIS SCHEDULED! [facility] - call [phone]   Dr Thedore Mins and I both noticed that your are more anemic than you were in June.  I am checking into the cause today   .  Best wishes for a joyous Christmas season and a happy New Year! May the Lord give you peace and joy during this holiday season, and may the promise of His return bring you comfort and hope for the future.  Regards,   Duncan Dull, MD

## 2023-05-15 LAB — IRON,TIBC AND FERRITIN PANEL
%SAT: 25 % (ref 16–45)
Ferritin: 134 ng/mL (ref 16–288)
Iron: 71 ug/dL (ref 45–160)
TIBC: 279 ug/dL (ref 250–450)

## 2023-05-15 LAB — ERYTHROPOIETIN: Erythropoietin: 11.1 m[IU]/mL (ref 2.6–18.5)

## 2023-05-15 NOTE — Assessment & Plan Note (Signed)
She has a history of lumbar radiculitis that resolved in 2016 with prednisone and tramadol, prescribed by Dr Yves Dill , and did not receive an ESI.  Last MRI of lumbar spine was in 2016 and noted borderline mild central stenosis at L3 . The last time she developed lower extremity weakness , she benefitted  From PT at [facility] to improve her balance and proximal muscle strength. She is encouraged to continue walking in the pool three times per week and using a recumbent bike two times weekly to maintain proximal muscle strength.

## 2023-05-15 NOTE — Assessment & Plan Note (Signed)
Her  random glucose and A1c  suggest she remains at risk for developing diabetes.  I recommend  that she continue to follow a low glycemic index diet and particpate regularly in an aerobic  exercise activity.

## 2023-05-15 NOTE — Assessment & Plan Note (Signed)
Secondary to loss of husband Ana Cruz in December 2023 .  Her children have continue to be supportive and involved in her life

## 2023-05-15 NOTE — Assessment & Plan Note (Signed)
Well controlled on metoprolol,  amlodipine and telmisartan regimen. Renal function stable, no changes today.  Lab Results  Component Value Date   CREATININE 1.06 05/12/2023   Lab Results  Component Value Date   NA 140 05/12/2023   K 4.1 05/12/2023   CL 106 05/12/2023   CO2 25 05/12/2023

## 2023-05-27 ENCOUNTER — Ambulatory Visit: Payer: Medicare Other | Attending: Cardiology

## 2023-05-27 DIAGNOSIS — I480 Paroxysmal atrial fibrillation: Secondary | ICD-10-CM | POA: Diagnosis present

## 2023-05-27 DIAGNOSIS — Z7901 Long term (current) use of anticoagulants: Secondary | ICD-10-CM | POA: Diagnosis present

## 2023-05-27 LAB — POCT INR: INR: 2.1 (ref 2.0–3.0)

## 2023-05-27 NOTE — Patient Instructions (Signed)
Continue 1mg  DAILY, EXCEPT 1.5 mg on Mondays AND Fridays.  INR in 6 weeks.  Eat 3 helpings of greens per week.     [phone]

## 2023-07-09 ENCOUNTER — Ambulatory Visit: Payer: Medicare Other | Attending: Cardiology

## 2023-07-09 DIAGNOSIS — I480 Paroxysmal atrial fibrillation: Secondary | ICD-10-CM | POA: Insufficient documentation

## 2023-07-09 DIAGNOSIS — Z7901 Long term (current) use of anticoagulants: Secondary | ICD-10-CM | POA: Diagnosis present

## 2023-07-09 LAB — POCT INR: INR: 2.9 (ref 2.0–3.0)

## 2023-07-09 NOTE — Patient Instructions (Signed)
 Continue 1mg  DAILY, EXCEPT 1.5 mg on Mondays AND Fridays.  INR in 6 weeks.  Eat 3 helpings of greens per week.     [phone]

## 2023-07-28 ENCOUNTER — Telehealth: Payer: Self-pay | Admitting: Cardiology

## 2023-07-28 DIAGNOSIS — I480 Paroxysmal atrial fibrillation: Secondary | ICD-10-CM

## 2023-07-28 MED ORDER — WARFARIN SODIUM 1 MG PO TABS: ORAL_TABLET | ORAL | 0 refills | Status: DC

## 2023-07-28 MED ORDER — METOPROLOL SUCCINATE ER 25 MG PO TB24
12.5000 mg | ORAL_TABLET | Freq: Every day | ORAL | 2 refills | Status: AC
Start: 2023-07-28 — End: ?

## 2023-07-28 NOTE — Telephone Encounter (Signed)
 Prescription refill request received for warfarin Lov: 6/14/224 ([place])  Next INR check: 08/20/23 Warfarin tablet strength: 1mg   Appropriate dose. Refill sent.

## 2023-07-28 NOTE — Telephone Encounter (Signed)
*  STAT* If patient is at the pharmacy, call can be transferred to refill team.   1. Which medications need to be refilled? (please list name of each medication and dose if known) metoprolol succinate (TOPROL XL) 25 MG 24 hr tablet ; warfarin (COUMADIN) 1 MG tablet     2. Would you like to learn more about the convenience, safety, & potential cost savings by using the [facility]?     3. Are you open to using the [facility] (Type [facility].   4. Which pharmacy/location (including street and city if local pharmacy) is medication to be sent to? [id number] [address],  [phone]    5. Do they need a 30 day or 90 day supply? 90

## 2023-07-28 NOTE — Telephone Encounter (Signed)
 Refill Request.

## 2023-07-28 NOTE — Telephone Encounter (Signed)
 Requested Prescriptions   Signed Prescriptions Disp Refills   metoprolol succinate (TOPROL XL) 25 MG 24 hr tablet 45 tablet 2    Sig: Take 0.5 tablets (12.5 mg total) by mouth daily.    Authorizing Provider: Debbe Odea    Ordering User: Kendrick Fries

## 2023-07-28 NOTE — Addendum Note (Signed)
 Addended by: Betsy Coder B on: 07/28/2023 12:34 PM   Modules accepted: Orders

## 2023-07-31 ENCOUNTER — Ambulatory Visit (INDEPENDENT_AMBULATORY_CARE_PROVIDER_SITE_OTHER): Payer: Medicare Other | Admitting: Podiatry

## 2023-07-31 ENCOUNTER — Encounter: Payer: Self-pay | Admitting: Podiatry

## 2023-07-31 DIAGNOSIS — M79674 Pain in right toe(s): Secondary | ICD-10-CM | POA: Diagnosis not present

## 2023-07-31 DIAGNOSIS — B351 Tinea unguium: Secondary | ICD-10-CM | POA: Diagnosis not present

## 2023-07-31 DIAGNOSIS — M79675 Pain in left toe(s): Secondary | ICD-10-CM

## 2023-08-03 ENCOUNTER — Encounter: Payer: Self-pay | Admitting: Podiatry

## 2023-08-03 NOTE — Progress Notes (Signed)
  Subjective:  Patient ID: Ana Cruz, female    DOB: 03/07/1934,  MRN: [id number]  89 y.o. female presents painful, elongated thickened toenails x 10 which are symptomatic when wearing enclosed shoe gear. This interferes with his/her daily activities.  Chief Complaint  Patient presents with   Nail Problem    "Trim my nails."    New problem(s): None   PCP is Sherlene Shams, MD.  Allergies  Allergen Reactions   Pamelor [Nortriptyline] Other (See Comments)    Urinary incontinence   Chocolate Flavoring Agent (Non-Screening) Hives   Clindamycin/Lincomycin     Tongue swelled,  Throat felt funny   Onion Swelling    Throat swelling   Other Hives    Raw Onions, Spring Time causes eye itching and runny nose. *Pt. Also Had Large Blisters from Tourniquets applied to her legs during her knee replacement surgery*   Tape    Hctz [Hydrochlorothiazide] Rash    rash    Review of Systems: Negative except as noted in the HPI.   Objective:  Ana Cruz is a pleasant 89 y.o. female WD, WN in NAD. AAO x 3.  Vascular Examination: Vascular status intact b/l with palpable pedal pulses. CFT immediate b/l. Pedal hair present. No edema. No pain with calf compression b/l. Skin temperature gradient WNL b/l. No varicosities noted. No cyanosis or clubbing noted.  Neurological Examination: Sensation grossly intact b/l with 10 gram monofilament. Vibratory sensation intact b/l.  Dermatological Examination: Pedal skin with normal turgor, texture and tone b/l. No open wounds nor interdigital macerations noted. Toenails 1-5 b/l thick, discolored, elongated with subungual debris and pain on dorsal palpation. No hyperkeratotic lesions noted b/l.   Musculoskeletal Examination: Muscle strength 5/5 to b/l LE.  No pain, crepitus noted b/l. Hammertoe deformity noted 2-5 b/l. Utilizes cane for ambulation assistance..  Radiographs: None  Last A1c:       No data to display            Assessment:   1. Pain due to onychomycosis of toenails of both feet    Plan:  Patient was evaluated and treated. All patient's and/or POA's questions/concerns addressed on today's visit. Mycotic toenails 1-5 debrided in length and girth without incident. Continue soft, supportive shoe gear daily. Report any pedal injuries to medical professional. Call office if there are any quesitons/concerns. -Patient/POA to call should there be question/concern in the interim.  Return in about 9 weeks (around 10/02/2023).  [name], DPM      [place] LOCATION: [address] [place] [address]                   Office [phone]   [facility] LOCATION: [address] [place], [place] [address] Office [phone]

## 2023-08-20 ENCOUNTER — Ambulatory Visit: Payer: Medicare Other | Attending: Cardiology

## 2023-08-20 DIAGNOSIS — I480 Paroxysmal atrial fibrillation: Secondary | ICD-10-CM | POA: Insufficient documentation

## 2023-08-20 DIAGNOSIS — Z7901 Long term (current) use of anticoagulants: Secondary | ICD-10-CM | POA: Insufficient documentation

## 2023-08-20 LAB — POCT INR: INR: 1.6 — AB (ref 2.0–3.0)

## 2023-08-20 NOTE — Patient Instructions (Signed)
 Take 2 tablets today only then Continue 1mg  DAILY, EXCEPT 1.5 mg on Mondays AND Fridays.  INR in 4 weeks.  Eat 3 helpings of greens per week.     [phone]

## 2023-08-28 ENCOUNTER — Other Ambulatory Visit: Payer: Self-pay | Admitting: Internal Medicine

## 2023-09-01 ENCOUNTER — Ambulatory Visit
Admission: RE | Admit: 2023-09-01 | Discharge: 2023-09-01 | Disposition: A | Discharge status: Home / Self Care | Payer: Medicare Other | Source: Ambulatory Visit | Attending: Internal Medicine | Admitting: Internal Medicine

## 2023-09-01 DIAGNOSIS — Z1231 Encounter for screening mammogram for malignant neoplasm of breast: Secondary | ICD-10-CM | POA: Diagnosis present

## 2023-09-03 ENCOUNTER — Telehealth: Payer: Self-pay | Admitting: Cardiology

## 2023-09-03 ENCOUNTER — Other Ambulatory Visit (HOSPITAL_COMMUNITY): Payer: Self-pay

## 2023-09-03 MED ORDER — TELMISARTAN 80 MG PO TABS
80.0000 mg | ORAL_TABLET | Freq: Every day | ORAL | 0 refills | Status: DC
Start: 2023-09-03 — End: 2023-12-03

## 2023-09-03 MED ORDER — TELMISARTAN 80 MG PO TABS
80.0000 mg | ORAL_TABLET | Freq: Every day | ORAL | 1 refills | Status: DC
  Filled 2023-09-03: qty 90, 90d supply, fill #0

## 2023-09-03 NOTE — Telephone Encounter (Signed)
*  STAT* If patient is at the pharmacy, call can be transferred to refill team.   1. Which medications need to be refilled? (please list name of each medication and dose if known) telmisartan (MICARDIS) 80 MG tablet   2. Which pharmacy/location (including street and city if local pharmacy) is medication to be sent to? [id number] [address], [place] [phone]   3. Do they need a 30 day or 90 day supply? 90

## 2023-09-03 NOTE — Telephone Encounter (Signed)
 Pt's medication was sent to pt's pharmacy as requested. Confirmation received.

## 2023-09-05 ENCOUNTER — Other Ambulatory Visit: Payer: Self-pay | Admitting: Cardiology

## 2023-09-08 ENCOUNTER — Other Ambulatory Visit (HOSPITAL_COMMUNITY): Payer: Self-pay

## 2023-09-08 ENCOUNTER — Telehealth: Payer: Self-pay | Admitting: Cardiology

## 2023-09-08 NOTE — Telephone Encounter (Signed)
 Patient called stating her Telmisartan refill was sent in error to [facility]. She noted that our office has since [facility] an [facility]; however, [facility] was unable to [facility] due to the medication already being processed by [facility].  The nurse contacted [facility], who confirmed they have returned the medication. The nurse also contacted [facility], who reported they re-ran the prescription through the patient's pharmacy benefits and were able to obtain approval. The patient was made aware and verbalized understanding.

## 2023-09-08 NOTE — Telephone Encounter (Signed)
 Pt c/o medication issue:  1. Name of Medication:   telmisartan (MICARDIS) 80 MG tablet    2. How are you currently taking this medication (dosage and times per day)? As written   3. Are you having a reaction (difficulty breathing--STAT)? No   4. What is your medication issue? Pt called in stating [facility] however [facility] will [facility] not [facility]. Please advise.

## 2023-09-17 ENCOUNTER — Ambulatory Visit: Attending: Cardiology

## 2023-09-17 DIAGNOSIS — I480 Paroxysmal atrial fibrillation: Secondary | ICD-10-CM | POA: Insufficient documentation

## 2023-09-17 DIAGNOSIS — Z7901 Long term (current) use of anticoagulants: Secondary | ICD-10-CM | POA: Diagnosis present

## 2023-09-17 LAB — POCT INR: INR: 1.9 — AB (ref 2.0–3.0)

## 2023-09-17 NOTE — Patient Instructions (Signed)
 Take 1.5 tablets today only then Continue 1mg  DAILY, EXCEPT 1.5 mg on Mondays AND Fridays.  INR in 4 weeks.  Eat 3 helpings of greens per week.     [phone]

## 2023-10-15 ENCOUNTER — Other Ambulatory Visit: Payer: Self-pay

## 2023-10-15 ENCOUNTER — Ambulatory Visit: Attending: Cardiology

## 2023-10-15 DIAGNOSIS — I480 Paroxysmal atrial fibrillation: Secondary | ICD-10-CM | POA: Insufficient documentation

## 2023-10-15 DIAGNOSIS — Z7901 Long term (current) use of anticoagulants: Secondary | ICD-10-CM | POA: Diagnosis present

## 2023-10-15 LAB — POCT INR: INR: 1.8 — AB (ref 2.0–3.0)

## 2023-10-15 MED ORDER — WARFARIN SODIUM 1 MG PO TABS
ORAL_TABLET | ORAL | 0 refills | Status: DC
Start: 2023-10-15 — End: 2024-01-12

## 2023-10-15 NOTE — Patient Instructions (Signed)
 Increase to 1mg  daily, except 1.5 mg on Mondays, Wednesdays AND Fridays.  INR in 4 weeks.  Eat 3 helpings of greens per week.     [phone]

## 2023-11-06 ENCOUNTER — Ambulatory Visit: Payer: Medicare Other | Admitting: Podiatry

## 2023-11-06 DIAGNOSIS — M79675 Pain in left toe(s): Secondary | ICD-10-CM

## 2023-11-06 DIAGNOSIS — M79674 Pain in right toe(s): Secondary | ICD-10-CM | POA: Diagnosis not present

## 2023-11-06 DIAGNOSIS — B351 Tinea unguium: Secondary | ICD-10-CM

## 2023-11-06 NOTE — Progress Notes (Signed)
  Subjective:  Patient ID: Ana Cruz, female    DOB: [birth year],  MRN: [id number]  89 y.o. female presents painful elongated mycotic toenails 1-5 bilaterally which are tender when wearing enclosed shoe gear. Pain is relieved with periodic professional debridement.  New problem(s): None   PCP is Thersia Flax, MD , and last visit was May 14, 2023.  Allergies  Allergen Reactions   Pamelor  [Nortriptyline ] Other (See Comments)    Urinary incontinence   Chocolate Flavoring Agent (Non-Screening) Hives   Clindamycin/Lincomycin     Tongue swelled,  Throat felt funny   Onion Swelling    Throat swelling   Other Hives    Raw Onions, Spring Time causes eye itching and runny nose. *Pt. Also Had Large Blisters from Tourniquets applied to her legs during her knee replacement surgery*   Tape    Hctz [Hydrochlorothiazide ] Rash    rash    Review of Systems: Negative except as noted in the HPI.   Objective:  Ana Cruz is a pleasant 89 y.o. female WD, WN in NAD. AAO x 3.  Vascular Examination: Vascular status intact b/l with palpable pedal pulses. CFT immediate b/l. Pedal hair present. No edema. No pain with calf compression b/l. Skin temperature gradient WNL b/l. No varicosities noted. No cyanosis or clubbing noted.  Neurological Examination: Sensation grossly intact b/l with 10 gram monofilament. Vibratory sensation intact b/l.  Dermatological Examination: Pedal skin with normal turgor, texture and tone b/l. No open wounds nor interdigital macerations noted. Toenails 1-5 b/l thick, discolored, elongated with subungual debris and pain on dorsal palpation. No hyperkeratotic lesions noted b/l.   Musculoskeletal Examination: Muscle strength 5/5 to b/l LE.  Hammertoes 2-5 b/l.  Radiographs: None  Last A1c:       No data to display           Assessment:   1. Pain due to onychomycosis of toenails of both feet    Plan:  Consent given for treatment. Patient  examined. All patient's and/or POA's questions/concerns addressed on today's visit.Toenails 1-5 debrided in length and girth without incident. Continue soft, supportive shoe gear daily. Report any pedal injuries to medical professional. Call office if there are any questions/concerns.  Return in about 3 months (around 02/06/2024).  Luella Sager, DPM       LOCATION: [address], [place] [address]                   Office [phone]   [facility] LOCATION: [address] [place], [place] [address] Office [phone]

## 2023-11-12 ENCOUNTER — Ambulatory Visit: Attending: Cardiology

## 2023-11-12 DIAGNOSIS — I480 Paroxysmal atrial fibrillation: Secondary | ICD-10-CM | POA: Insufficient documentation

## 2023-11-12 DIAGNOSIS — Z7901 Long term (current) use of anticoagulants: Secondary | ICD-10-CM | POA: Insufficient documentation

## 2023-11-12 LAB — POCT INR: INR: 2.2 (ref 2.0–3.0)

## 2023-11-12 NOTE — Patient Instructions (Signed)
 Continue 1mg  daily, except 1.5 mg on Mondays, Wednesdays AND Fridays.  INR in 5 weeks.  Eat 3 helpings of greens per week.     [phone]

## 2023-11-13 ENCOUNTER — Encounter: Payer: Self-pay | Admitting: Internal Medicine

## 2023-11-13 ENCOUNTER — Ambulatory Visit (INDEPENDENT_AMBULATORY_CARE_PROVIDER_SITE_OTHER): Payer: Medicare Other | Admitting: Internal Medicine

## 2023-11-13 ENCOUNTER — Encounter: Payer: Self-pay | Admitting: Podiatry

## 2023-11-13 VITALS — BP 126/66 | HR 57 | Temp 97.8°F | Ht 63.0 in | Wt 149.6 lb

## 2023-11-13 DIAGNOSIS — E039 Hypothyroidism, unspecified: Secondary | ICD-10-CM

## 2023-11-13 DIAGNOSIS — M25541 Pain in joints of right hand: Secondary | ICD-10-CM

## 2023-11-13 DIAGNOSIS — D631 Anemia in chronic kidney disease: Secondary | ICD-10-CM

## 2023-11-13 DIAGNOSIS — E7800 Pure hypercholesterolemia, unspecified: Secondary | ICD-10-CM | POA: Diagnosis not present

## 2023-11-13 DIAGNOSIS — I10 Essential (primary) hypertension: Secondary | ICD-10-CM

## 2023-11-13 DIAGNOSIS — M25542 Pain in joints of left hand: Secondary | ICD-10-CM

## 2023-11-13 DIAGNOSIS — R7303 Prediabetes: Secondary | ICD-10-CM

## 2023-11-13 DIAGNOSIS — I480 Paroxysmal atrial fibrillation: Secondary | ICD-10-CM

## 2023-11-13 DIAGNOSIS — N1831 Chronic kidney disease, stage 3a: Secondary | ICD-10-CM

## 2023-11-13 LAB — LIPID PANEL
Cholesterol: 152 mg/dL (ref 0–200)
HDL: 72.4 mg/dL (ref >39.00)
LDL Cholesterol: 67 mg/dL (ref 0–99)
NonHDL: 79.12
Total CHOL/HDL Ratio: 2
Triglycerides: 61 mg/dL (ref 0.0–149.0)
VLDL: 12.2 mg/dL (ref 0.0–40.0)

## 2023-11-13 LAB — HEMOGLOBIN A1C: Hgb A1c MFr Bld: 6 % (ref 4.6–6.5)

## 2023-11-13 MED ORDER — AMOXICILLIN 500 MG PO CAPS: 2000.0000 mg | ORAL_CAPSULE | Freq: Once | ORAL | 2 refills | Status: AC

## 2023-11-13 MED ORDER — ROSUVASTATIN CALCIUM 20 MG PO TABS: 20.0000 mg | ORAL_TABLET | Freq: Every day | ORAL | 3 refills | Status: AC

## 2023-11-13 NOTE — Patient Instructions (Addendum)
 If you decide to  retry using iron less frequently,  you can add 200 mg colace to your regimen on the days you use it to manage the constipation    Make sure iron supplements are  taken with food or OJ

## 2023-11-13 NOTE — Progress Notes (Signed)
 Subjective:  Patient ID: Ana Cruz, female    DOB: [birth year]  Age: 89 y.o. MRN: [id number]  CC: The primary encounter diagnosis was Primary hypertension. Diagnoses of Acquired hypothyroidism, Pure hypercholesterolemia, Prediabetes, Anemia due to stage 3a chronic kidney disease (HCC), Paroxysmal atrial fibrillation (HCC), and Arthralgia of both hands were also pertinent to this visit.   HPI Ana Cruz presents for  Chief Complaint  Patient presents with   Medical Management of Chronic Issues   1)feeling good.  Social network  is strong and  intact . All children visiting  her over the summer, even her son with bladder CA  coming to visit by train    2) CKD:  advised to iron supplementation started by Dr Zelda Hickman ; was not tolerated due to immediate constipation   did not try  adding colace  trying instead to  increase her dietary iron   3)  right shoulder still problematic  hand goes numb if she lies  on right side.  Stilll Capable of all ADL's   4) Mobility :  she using a cane to walk  outside of the home,  and a walker at night for bathroom trips to prevent falls .  Also using a rolling basket for laundry and groceries   5) Hypertension: patient checks blood pressure twice weekly at home.  Readings have been for the most part <130/80 at rest . Patient is following a reduced salt diet most days and is taking medications as prescribed (amlodipine  5 mg. Metoprolol  12.5 mg , telmisartan  80 mg )    Outpatient Medications Prior to Visit  Medication Sig Dispense Refill   acetaminophen  (TYLENOL ) 500 MG tablet Take 500 mg by mouth every 6 (six) hours as needed (As needed for pain).     amLODipine  (NORVASC ) 5 MG tablet TAKE 1 TABLET (5 MG TOTAL) BY MOUTH DAILY. 90 tablet 3   Artificial Tear Ointment (EYE LUBRICANT OP) Apply 1 drop to eye 2 (two) times daily.     Cholecalciferol (VITAMIN D3) 1000 UNITS CAPS Take 1,000 Units by mouth daily.     fluticasone  (FLONASE ) 50 MCG/ACT  nasal spray Place 1 spray into both nostrils daily. 16 g 0   levothyroxine  (SYNTHROID ) 75 MCG tablet TAKE 1 TABLET BY MOUTH EVERY DAY 90 tablet 1   metoprolol  succinate (TOPROL  XL) 25 MG 24 hr tablet Take 0.5 tablets (12.5 mg total) by mouth daily. 45 tablet 2   Probiotic Product (PROBIOTIC-10) CHEW Chew 1 tablet by mouth daily.     telmisartan  (MICARDIS ) 80 MG tablet Take 1 tablet (80 mg total) by mouth daily. 90 tablet 0   vitamin B-12 (CYANOCOBALAMIN ) 1000 MCG tablet Take 1 tablet (1,000 mcg total) by mouth daily. 90 tablet 3   vitamin E 180 MG (400 UNITS) capsule Take 800 Units by mouth daily.     warfarin (COUMADIN ) 1 MG tablet Take 1 tablet to 2 tablets Daily or as prescribed by Coumadin  Clinic 120 tablet 0   amoxicillin  (AMOXIL ) 500 MG capsule Take 500 mg by mouth daily. For dental procedures     rosuvastatin  (CRESTOR ) 20 MG tablet TAKE 1 TABLET BY MOUTH EVERY DAY 90 tablet 3   No facility-administered medications prior to visit.    Review of Systems;  Patient denies headache, fevers, malaise, unintentional weight loss, skin rash, eye pain, sinus congestion and sinus pain, sore throat, dysphagia,  hemoptysis , cough, dyspnea, wheezing, chest pain, palpitations, orthopnea, edema, abdominal pain, nausea, melena, diarrhea, constipation,  flank pain, dysuria, hematuria, urinary  Frequency, nocturia, numbness, tingling, seizures,  Focal weakness, Loss of consciousness,  Tremor, insomnia, depression, anxiety, and suicidal ideation.      Objective:  BP 126/66   Pulse (!) 57   Temp 97.8 F (36.6 C)   Ht 5' 3 (1.6 m)   Wt 149 lb 9.6 oz (67.9 kg)   SpO2 97%   BMI 26.50 kg/m   BP Readings from Last 3 Encounters:  11/13/23 126/66  05/14/23 (!) 136/58  02/25/23 116/68    Wt Readings from Last 3 Encounters:  11/13/23 149 lb 9.6 oz (67.9 kg)  05/14/23 151 lb (68.5 kg)  02/25/23 151 lb 12.8 oz (68.9 kg)    Physical Exam  Lab Results  Component Value Date   HGBA1C 6.0 11/13/2023    HGBA1C 6.0 11/28/2021   HGBA1C 5.7 (H) 06/29/2021    Lab Results  Component Value Date   CREATININE 1.06 05/12/2023   CREATININE 1.24 (H) 11/08/2022   CREATININE 1.08 05/30/2022    Lab Results  Component Value Date   WBC 5.5 05/12/2023   HGB 10.1 (L) 05/12/2023   HCT 31.2 (L) 05/12/2023   PLT 194.0 05/12/2023   GLUCOSE 91 05/12/2023   CHOL 152 11/13/2023   TRIG 61.0 11/13/2023   HDL 72.40 11/13/2023   LDLDIRECT 114.0 05/30/2022   LDLCALC 67 11/13/2023   ALT 13 11/13/2023   AST 21 11/13/2023   NA 140 05/12/2023   K 4.1 05/12/2023   CL 106 05/12/2023   CREATININE 1.06 05/12/2023   BUN 35 (H) 05/12/2023   CO2 25 05/12/2023   TSH 0.975 11/13/2023   INR 2.2 11/12/2023   HGBA1C 6.0 11/13/2023   MICROALBUR <0.7 12/17/2021    MM 3D SCREENING MAMMOGRAM BILATERAL BREAST Result Date: 09/02/2023 CLINICAL DATA:  Screening. EXAM: DIGITAL SCREENING BILATERAL MAMMOGRAM WITH TOMOSYNTHESIS AND CAD TECHNIQUE: Bilateral screening digital craniocaudal and mediolateral oblique mammograms were obtained. Bilateral screening digital breast tomosynthesis was performed. The images were evaluated with computer-aided detection. COMPARISON:  Previous exam(s). ACR Breast Density Category b: There are scattered areas of fibroglandular density. FINDINGS: There are no findings suspicious for malignancy. IMPRESSION: No mammographic evidence of malignancy. A result letter of this screening mammogram will be mailed directly to the patient. RECOMMENDATION: Screening mammogram in one year. (Code:[id number]-B-01Y) BI-RADS CATEGORY  1: Negative. Electronically Signed   By: Rinda Cheers M.D.   On: 09/02/2023 15:27    Assessment & Plan:  .Primary hypertension  Acquired hypothyroidism Assessment & Plan: Thyroid  function is WNL on current dose of 75 mcg daily and she is asymptomatic .  No current changes needed.   Lab Results  Component Value Date   TSH 0.975 11/13/2023     Orders: -     TSH  Pure  hypercholesterolemia -     Lipid panel -     Hepatic function panel  Prediabetes -     Hemoglobin A1c  Anemia due to stage 3a chronic kidney disease (HCC) Assessment & Plan: Encouraged to repeat trial of iron and add colace to prevent constipation    Paroxysmal atrial fibrillation [facility]) Assessment & Plan: Episodes are rare .  She is anticoagulated   and rate is slow on metoprolol  .  MANAGED [place] [facility] since Bary Likes has retired. Continue anticoagulation for stroke risk mitigation       Arthralgia of both hands Assessment & Plan: Using tylenol   avoiding NSAIDs given CKD    Other orders -  Rosuvastatin  Calcium ; Take 1 tablet (20 mg total) by mouth daily.  Dispense: 90 tablet; Refill: 3 -     Amoxicillin ; Take 4 capsules (2,000 mg total) by mouth once for 1 dose. For dental procedures  Dispense: 4 capsule; Refill: 2     Follow-up: Return in about 6 months (around 05/14/2024).   Thersia Flax, MD

## 2023-11-13 NOTE — Assessment & Plan Note (Signed)
 Episodes are rare .  She is anticoagulated   and rate is slow on metoprolol  .  MANAGED [place] [facility] since Bary Likes has retired. Continue anticoagulation for stroke risk mitigation

## 2023-11-14 LAB — HEPATIC FUNCTION PANEL
ALT: 13 IU/L (ref 0–32)
AST: 21 IU/L (ref 0–40)
Albumin: 4 g/dL (ref 3.7–4.7)
Alkaline Phosphatase: 93 IU/L (ref 44–121)
Bilirubin Total: 0.4 mg/dL (ref 0.0–1.2)
Bilirubin, Direct: 0.2 mg/dL (ref 0.00–0.40)
Total Protein: 6.8 g/dL (ref 6.0–8.5)

## 2023-11-14 LAB — TSH: TSH: 0.975 u[IU]/mL (ref 0.450–4.500)

## 2023-11-14 NOTE — Assessment & Plan Note (Signed)
 Thyroid  function is WNL on current dose of 75 mcg daily and she is asymptomatic .  No current changes needed.   Lab Results  Component Value Date   TSH 0.975 11/13/2023

## 2023-11-14 NOTE — Assessment & Plan Note (Signed)
 Encouraged to repeat trial of iron and add colace to prevent constipation

## 2023-11-14 NOTE — Assessment & Plan Note (Signed)
 Using tylenol   avoiding NSAIDs given CKD

## 2023-11-15 ENCOUNTER — Ambulatory Visit: Payer: Self-pay | Admitting: Internal Medicine

## 2023-11-18 ENCOUNTER — Ambulatory Visit: Attending: Cardiology | Admitting: Cardiology

## 2023-11-18 ENCOUNTER — Encounter: Payer: Self-pay | Admitting: Cardiology

## 2023-11-18 VITALS — BP 130/50 | HR 50 | Ht 63.0 in | Wt 150.0 lb

## 2023-11-18 DIAGNOSIS — I10 Essential (primary) hypertension: Secondary | ICD-10-CM | POA: Diagnosis not present

## 2023-11-18 DIAGNOSIS — I480 Paroxysmal atrial fibrillation: Secondary | ICD-10-CM | POA: Diagnosis not present

## 2023-11-18 DIAGNOSIS — E7800 Pure hypercholesterolemia, unspecified: Secondary | ICD-10-CM | POA: Insufficient documentation

## 2023-11-18 NOTE — Progress Notes (Signed)
 Cardiology Office Note:    Date:  11/18/2023   ID:  Stevenson Elbe, DOB Dec 20, 1933, MRN [id number]  PCP:  Thersia Flax, MD   [facility] Providers Cardiologist:  Constancia Delton, MD     Referring MD: Thersia Flax, MD   Chief Complaint  Patient presents with   Follow-up    12 month f/u. Meds reviewed verbally with pt.    History of Present Illness:    Ana Cruz is a 89 y.o. female with a hx of hypertension, hyperlipidemia, paroxysmal atrial fibrillation who presents for follow-up.  Doing okay, denies dizziness, shortness of breath, palpitations.  Ambulates with a walker.  Denies any falls.  Tolerating Coumadin , no bleeding issues.  Denies any concerns today.  Very happy with care given by Coumadin  clinic.  INR obtained 6 days ago was therapeutic.  Prior notes/studies Echo 5/24 EF 60 to 65% Echo at [facility] 05/2021 EF 55%, mild MR  Past Medical History:  Diagnosis Date   allergic rhinitis    Atrial fibrillation (HCC)    Bilateral sacral insufficiency fracture with routine healing 08/21/2015   Chronic kidney disease, stage I    mild, did improve with cessation of NSAIDs   Elbow fracture, left    repaired Jan 2000   Hyperlipidemia    Hypertension    hypothyroidism    medicated since age 89, no prior surgery   Spinal stenosis    has L5 disk herniation with nerve root displacement    Past Surgical History:  Procedure Laterality Date   ABDOMINAL HYSTERECTOMY  1979   APPENDECTOMY  1969   BILATERAL OOPHORECTOMY     squential   BLADDER REPAIR  1980   OVARIAN CYST REMOVAL  April 1969   REPLACEMENT TOTAL KNEE BILATERAL  2009   reverse shoulder surgery  04/05/2016   ROTATOR CUFF REPAIR  Jan 2000   ROTATOR CUFF REPAIR  2000    Current Medications: Current Meds  Medication Sig   acetaminophen  (TYLENOL ) 500 MG tablet Take 500 mg by mouth every 6 (six) hours as needed (As needed for pain).   amLODipine  (NORVASC ) 5 MG tablet TAKE 1 TABLET (5  MG TOTAL) BY MOUTH DAILY.   amoxicillin  (AMOXIL ) 500 MG capsule Take 2,000 mg by mouth as needed. Dental visits only   Artificial Tear Ointment (EYE LUBRICANT OP) Apply 1 drop to eye 2 (two) times daily.   Cholecalciferol (VITAMIN D3) 1000 UNITS CAPS Take 1,000 Units by mouth daily.   fluticasone  (FLONASE ) 50 MCG/ACT nasal spray Place 1 spray into both nostrils daily.   levothyroxine  (SYNTHROID ) 75 MCG tablet TAKE 1 TABLET BY MOUTH EVERY DAY   Probiotic Product (PROBIOTIC-10) CHEW Chew 1 tablet by mouth daily.   rosuvastatin  (CRESTOR ) 20 MG tablet Take 1 tablet (20 mg total) by mouth daily.   telmisartan  (MICARDIS ) 80 MG tablet Take 1 tablet (80 mg total) by mouth daily.   vitamin B-12 (CYANOCOBALAMIN ) 1000 MCG tablet Take 1 tablet (1,000 mcg total) by mouth daily.   vitamin E 180 MG (400 UNITS) capsule Take 800 Units by mouth daily.   warfarin (COUMADIN ) 1 MG tablet Take 1 tablet to 2 tablets Daily or as prescribed by Coumadin  Clinic   [DISCONTINUED] metoprolol  succinate (TOPROL  XL) 25 MG 24 hr tablet Take 0.5 tablets (12.5 mg total) by mouth daily.     Allergies:   Pamelor  [nortriptyline ], Chocolate flavoring agent (non-screening), Clindamycin/lincomycin, Onion, Other, Tape, and Hctz [hydrochlorothiazide ]   Social History   Socioeconomic  History   Marital status: Widowed    Spouse name: Not on file   Number of children: 4   Years of education: 13.5   Highest education level: Some college, no degree  Occupational History   Occupation: retired  Tobacco Use   Smoking status: Never   Smokeless tobacco: Never  Vaping Use   Vaping status: Never Used  Substance and Sexual Activity   Alcohol use: Not Currently   Drug use: No   Sexual activity: Not on file  Other Topics Concern   Not on file  Social History Narrative   Married    Lives [place]    Right handed   One story   Social Drivers of Health   Financial Resource Strain: Low Risk  (11/09/2023)   Overall Financial  Resource Strain (CARDIA)    Difficulty of Paying Living Expenses: Not hard at all  Food Insecurity: No Food Insecurity (11/09/2023)   Hunger Vital Sign    Worried About Running Out of Food in the Last Year: Never true    Ran Out of Food in the Last Year: Never true  Transportation Needs: No Transportation Needs (11/09/2023)   PRAPARE - Administrator, Civil Service (Medical): No    Lack of Transportation (Non-Medical): No  Physical Activity: Insufficiently Active (11/09/2023)   Exercise Vital Sign    Days of Exercise per Week: 1 day    Minutes of Exercise per Session: 30 min  Stress: No Stress Concern Present (11/09/2023)   [employer] of Occupational Health - Occupational Stress Questionnaire    Feeling of Stress : Only a little  Social Connections: Moderately Integrated (11/09/2023)   Social Connection and Isolation Panel    Frequency of Communication with Friends and Family: Three times a week    Frequency of Social Gatherings with Friends and Family: Twice a week    Attends Religious Services: More than 4 times per year    Active Member of [employer] or Organizations: Yes    Attends Banker Meetings: 1 to 4 times per year    Marital Status: Widowed     Family History: The patient's family history includes Cancer (age of onset: 82) in her paternal grandfather; Dementia in her mother; Heart disease (age of onset: 60) in her father; Hypertension in her mother. There is no history of Breast cancer.  ROS:   Please see the history of present illness.     All other systems reviewed and are negative.  EKGs/Labs/Other Studies Reviewed:    The following studies were reviewed today:   EKG Interpretation Date/Time:  Tuesday November 18 2023 10:54:29 EDT Ventricular Rate:  50 PR Interval:  152 QRS Duration:  76 QT Interval:  410 QTC Calculation: 373 R Axis:   -41  Text Interpretation: Sinus bradycardia Left axis deviation Confirmed by Constancia Delton ([id number]) on  11/18/2023 11:02:06 AM    Recent Labs: 05/12/2023: BUN 35; Creatinine, Ser 1.06; Hemoglobin 10.1; Platelets 194.0; Potassium 4.1; Sodium 140 11/13/2023: ALT 13; TSH 0.975  Recent Lipid Panel    Component Value Date/Time   CHOL 152 11/13/2023 1046   TRIG 61.0 11/13/2023 1046   HDL 72.40 11/13/2023 1046   CHOLHDL 2 11/13/2023 1046   VLDL 12.2 11/13/2023 1046   LDLCALC 67 11/13/2023 1046   LDLCALC 51 05/12/2023 0839   LDLDIRECT 114.0 05/30/2022 1041     Risk Assessment/Calculations:             Physical Exam:  VS:  BP (!) 130/50 (BP Location: Left Arm, Patient Position: Sitting, Cuff Size: Normal)   Pulse (!) 50   Ht 5' 3 (1.6 m)   Wt 150 lb (68 kg)   SpO2 97%   BMI 26.57 kg/m     Wt Readings from Last 3 Encounters:  11/18/23 150 lb (68 kg)  11/13/23 149 lb 9.6 oz (67.9 kg)  05/14/23 151 lb (68.5 kg)     GEN:  Well nourished, well developed in no acute distress HEENT: Normal NECK: No JVD; No carotid bruits CARDIAC: Bradycardic, regular RESPIRATORY:  Clear to auscultation without rales, wheezing or rhonchi  ABDOMEN: Soft, non-tender, non-distended MUSCULOSKELETAL:  No edema; No deformity  SKIN: Warm and dry NEUROLOGIC:  Alert and oriented x 3 PSYCHIATRIC:  Normal affect   ASSESSMENT:    1. Paroxysmal atrial fibrillation (HCC)   2. Primary hypertension   3. Pure hypercholesterolemia    PLAN:    In order of problems listed above:  Paroxysmal atrial fibrillation, asymptomatic with no palpitations.  Echo 5/24 EF 60 to 65%.  EKG today shows sinus bradycardia heart rate 50. stop Toprol -XL 12.5 mg daily.  Continue warfarin.  Hypertension, BP controlled.  Diastolic BP low.  Stop Toprol -XL as above, continue Norvasc  5 mg daily, telmisartan  80 mg daily. Hyperlipidemia, low-cholesterol diet, statin okay.  Follow-up in 1 year.     Medication Adjustments/Labs and Tests Ordered: Current medicines are reviewed at length with the patient today.  Concerns regarding  medicines are outlined above.  Orders Placed This Encounter  Procedures   EKG 12-Lead   No orders of the defined types were placed in this encounter.   Patient Instructions  Medication Instructions:  Your physician recommends the following medication changes.  STOP TAKING:  TOPROL  XL   *If you need a refill on your cardiac medications before your next appointment, please call your pharmacy*  Lab Work:  No labs ordered today   If you have labs (blood work) drawn today and your tests are completely normal, you will receive your results only by: MyChart Message (if you have MyChart) OR A paper copy in the mail If you have any lab test that is abnormal or we need to change your treatment, we will call you to review the results.  Testing/Procedures:  No test ordered today   Follow-Up: At [facility], you and your health needs are our priority.  As part of our continuing mission to provide you with exceptional heart care, our providers are all part of one team.  This team includes your primary Cardiologist (physician) and Advanced Practice Providers or APPs (Physician Assistants and Nurse Practitioners) who all work together to provide you with the care you need, when you need it.  Your next appointment:   1 year(s)  Provider:   You may see Constancia Delton, MD or one of the following Advanced Practice Providers on your designated Care Team:   Laneta Pintos, NP Gildardo Labrador, PA-C Varney Gentleman, PA-C Cadence [place], PA-C Ronald Cockayne, NP Morey Ar, NP    We recommend signing up for the patient portal called MyChart.  Sign up information is provided on this After Visit Summary.  MyChart is used to connect with patients for Virtual Visits (Telemedicine).  Patients are able to view lab/test results, encounter notes, upcoming appointments, etc.  Non-urgent messages can be sent to your provider as well.   To learn more about what you can do with MyChart, go to  [email or web address].  Signed, Constancia Delton, MD  11/18/2023 11:56 AM    [facility]

## 2023-11-18 NOTE — Patient Instructions (Signed)
 Medication Instructions:  Your physician recommends the following medication changes.  STOP TAKING:  TOPROL  XL   *If you need a refill on your cardiac medications before your next appointment, please call your pharmacy*  Lab Work:  No labs ordered today   If you have labs (blood work) drawn today and your tests are completely normal, you will receive your results only by: MyChart Message (if you have MyChart) OR A paper copy in the mail If you have any lab test that is abnormal or we need to change your treatment, we will call you to review the results.  Testing/Procedures:  No test ordered today   Follow-Up: At [facility] you and your health needs are our priority.  As part of our continuing mission to provide you with exceptional heart care, our providers are all part of one team.  This team includes your primary Cardiologist (physician) and Advanced Practice Providers or APPs (Physician Assistants and Nurse Practitioners) who all work together to provide you with the care you need, when you need it.  Your next appointment:   1 year(s)  Provider:   You may see Constancia Delton, MD or one of the following Advanced Practice Providers on your designated Care Team:   Laneta Pintos, NP Gildardo Labrador, PA-C Varney Gentleman, PA-C Cadence Carrizo, PA-C Ronald Cockayne, NP Morey Ar, NP    We recommend signing up for the patient portal called MyChart.  Sign up information is provided on this After Visit Summary.  MyChart is used to connect with patients for Virtual Visits (Telemedicine).  Patients are able to view lab/test results, encounter notes, upcoming appointments, etc.  Non-urgent messages can be sent to your provider as well.   To learn more about what you can do with MyChart, go to [email or web address].

## 2023-12-03 ENCOUNTER — Other Ambulatory Visit: Payer: Self-pay | Admitting: Cardiology

## 2023-12-17 ENCOUNTER — Ambulatory Visit: Attending: Cardiology

## 2023-12-17 DIAGNOSIS — I480 Paroxysmal atrial fibrillation: Secondary | ICD-10-CM | POA: Diagnosis present

## 2023-12-17 DIAGNOSIS — Z7901 Long term (current) use of anticoagulants: Secondary | ICD-10-CM | POA: Insufficient documentation

## 2023-12-17 LAB — POCT INR: INR: 1.8 — AB (ref 2.0–3.0)

## 2023-12-17 NOTE — Patient Instructions (Signed)
 Take 2 tablets today only then Continue 1mg  daily, except 1.5 mg on Mondays, Wednesdays AND Fridays.  INR in 3 weeks.  Eat 2 helpings of greens per week.     [phone]

## 2024-01-07 ENCOUNTER — Ambulatory Visit: Attending: Cardiology

## 2024-01-07 DIAGNOSIS — Z7901 Long term (current) use of anticoagulants: Secondary | ICD-10-CM | POA: Diagnosis present

## 2024-01-07 DIAGNOSIS — I480 Paroxysmal atrial fibrillation: Secondary | ICD-10-CM | POA: Diagnosis present

## 2024-01-07 LAB — POCT INR: INR: 1.7 — AB (ref 2.0–3.0)

## 2024-01-07 NOTE — Patient Instructions (Signed)
 Increase to 1.5 tablets Daily, except 1 tablet every Tuesday and Thursday.  INR in 2 weeks.  Eat 2 helpings of greens per week.     [phone]

## 2024-01-11 ENCOUNTER — Other Ambulatory Visit: Payer: Self-pay | Admitting: Cardiology

## 2024-01-11 DIAGNOSIS — I480 Paroxysmal atrial fibrillation: Secondary | ICD-10-CM

## 2024-01-21 ENCOUNTER — Ambulatory Visit: Attending: Cardiology

## 2024-01-21 DIAGNOSIS — I480 Paroxysmal atrial fibrillation: Secondary | ICD-10-CM | POA: Insufficient documentation

## 2024-01-21 DIAGNOSIS — Z7901 Long term (current) use of anticoagulants: Secondary | ICD-10-CM | POA: Insufficient documentation

## 2024-01-21 LAB — POCT INR: INR: 2.6 (ref 2.0–3.0)

## 2024-01-21 NOTE — Patient Instructions (Signed)
 Continue 1.5 tablets Daily, except 1 tablet every Tuesday and Thursday.  INR in 4 weeks.  Eat 2 helpings of greens per week.     [phone]

## 2024-02-03 ENCOUNTER — Telehealth: Payer: Self-pay | Admitting: Emergency Medicine

## 2024-02-03 ENCOUNTER — Emergency Department

## 2024-02-03 ENCOUNTER — Emergency Department
Admission: EM | Admit: 2024-02-03 | Discharge: 2024-02-03 | Disposition: A | Discharge status: Home / Self Care | Attending: Emergency Medicine | Admitting: Emergency Medicine

## 2024-02-03 ENCOUNTER — Telehealth: Payer: Self-pay | Admitting: Cardiology

## 2024-02-03 ENCOUNTER — Other Ambulatory Visit: Payer: Self-pay

## 2024-02-03 DIAGNOSIS — R079 Chest pain, unspecified: Secondary | ICD-10-CM | POA: Diagnosis not present

## 2024-02-03 DIAGNOSIS — N189 Chronic kidney disease, unspecified: Secondary | ICD-10-CM | POA: Insufficient documentation

## 2024-02-03 DIAGNOSIS — I480 Paroxysmal atrial fibrillation: Secondary | ICD-10-CM | POA: Insufficient documentation

## 2024-02-03 DIAGNOSIS — R002 Palpitations: Secondary | ICD-10-CM | POA: Diagnosis present

## 2024-02-03 DIAGNOSIS — Z7901 Long term (current) use of anticoagulants: Secondary | ICD-10-CM | POA: Diagnosis not present

## 2024-02-03 DIAGNOSIS — R0602 Shortness of breath: Secondary | ICD-10-CM | POA: Diagnosis present

## 2024-02-03 LAB — PROTIME-INR
INR: 1.9 — ABNORMAL HIGH (ref 0.8–1.2)
Prothrombin Time: 23.1 s — ABNORMAL HIGH (ref 11.4–15.2)

## 2024-02-03 LAB — CBC
HCT: 34.9 % — ABNORMAL LOW (ref 36.0–46.0)
Hemoglobin: 11.1 g/dL — ABNORMAL LOW (ref 12.0–15.0)
MCH: 30.3 pg (ref 26.0–34.0)
MCHC: 31.8 g/dL (ref 30.0–36.0)
MCV: 95.4 fL (ref 80.0–100.0)
Platelets: 234 K/uL (ref 150–400)
RBC: 3.66 MIL/uL — ABNORMAL LOW (ref 3.87–5.11)
RDW: 14.3 % (ref 11.5–15.5)
WBC: 6.5 K/uL (ref 4.0–10.5)
nRBC: 0 % (ref 0.0–0.2)

## 2024-02-03 LAB — BASIC METABOLIC PANEL WITH GFR
Anion gap: 10 (ref 5–15)
BUN: 53 mg/dL — ABNORMAL HIGH (ref 8–23)
CO2: 22 mmol/L (ref 22–32)
Calcium: 9.3 mg/dL (ref 8.9–10.3)
Chloride: 107 mmol/L (ref 98–111)
Creatinine, Ser: 1.45 mg/dL — ABNORMAL HIGH (ref 0.44–1.00)
GFR, Estimated: 35 mL/min — ABNORMAL LOW (ref >60)
Glucose, Bld: 127 mg/dL — ABNORMAL HIGH (ref 70–99)
Potassium: 4.5 mmol/L (ref 3.5–5.1)
Sodium: 139 mmol/L (ref 135–145)

## 2024-02-03 LAB — BRAIN NATRIURETIC PEPTIDE: B Natriuretic Peptide: 176.8 pg/mL — ABNORMAL HIGH (ref 0.0–100.0)

## 2024-02-03 LAB — HEPATIC FUNCTION PANEL
ALT: 15 U/L (ref 0–44)
AST: 28 U/L (ref 15–41)
Albumin: 3.9 g/dL (ref 3.5–5.0)
Alkaline Phosphatase: 65 U/L (ref 38–126)
Bilirubin, Direct: <0.1 mg/dL (ref 0.0–0.2)
Total Bilirubin: 0.7 mg/dL (ref 0.0–1.2)
Total Protein: 7.6 g/dL (ref 6.5–8.1)

## 2024-02-03 LAB — TROPONIN I (HIGH SENSITIVITY)
Troponin I (High Sensitivity): 32 ng/L — ABNORMAL HIGH (ref <18)
Troponin I (High Sensitivity): 38 ng/L — ABNORMAL HIGH (ref <18)

## 2024-02-03 LAB — LIPASE, BLOOD: Lipase: 25 U/L (ref 11–51)

## 2024-02-03 MED ORDER — SODIUM CHLORIDE 0.9 % IV BOLUS
500.0000 mL | Freq: Once | INTRAVENOUS | Status: AC
  Administered 2024-02-03: 500 mL via INTRAVENOUS

## 2024-02-03 NOTE — Discharge Instructions (Addendum)
 You have no evidence of being in A-fib at this time and no evidence of heart attack however it is possible that you are going into A-fib or that you could just be dehydrated and then it is normal sinus and your heart rates are just elevated from dehydration.  Make sure you drink plenty of fluid.  We discussed starting metoprolol  but have opted to hold off given at rest her heart rates go down to the 50s and this could cause some symptomatic issues as well.  I do recommend you call cardiology to make a follow-up appointment to discuss Holter monitor and return to the ER if develop change in symptoms, worsening symptoms or any other concern

## 2024-02-03 NOTE — Telephone Encounter (Signed)
 STAT call   Pt relates CP (4-5/10) under chest with radiation to mid-back for the last 7 days, c/o being cold, tired, and lightheaded.  Denies N/V/diaphoresis or SOB.  Pt reports BP usually 128/64 HR 60s, and now 148/73 HR 88.  Pt reports compliance with meds, denies having nitroSTAT to take.   Recommended ED and pt reports that her daughter will take her to be evaluated.

## 2024-02-03 NOTE — Telephone Encounter (Signed)
 Attempted to call pt, [facility] and [facility] note

## 2024-02-03 NOTE — ED Provider Notes (Addendum)
 [facility] Provider Note    Event Date/Time   First MD Initiated Contact with Patient 02/03/24 [phone]     (approximate)   History   Chest Pain   HPI  Ana Cruz is a 89 y.o. female with paroxysmal A-fib on warfarin who comes in with concern for chest pain.  Patient reports intermittent chest pain along her right side.  She denies it being in her abdomen.  She reports that it comes on at different times.  Occasionally she has some pain in her back but denies any pain in her chest or back at this time.  She states that the pain usually comes on when she is having palpitations but not always.  She reports that he tried to call her cardiologist to make a appointment but they told her to come here to be evaluated.  Patient does report she is on warfarin.  She reports previously being on metoprolol  but taken off of it secondary to too low of heart rates.  I reviewed a cardiology note from 11/18/2023 where patient's heart rate was in the 50s with EKG showing sinus bradycardia.  Her office visit on 11/18/2023 did show that she was on metoprolol  XL 12.5 once daily but they had discontinued it secondary to her low heart rate but at that time she was asymptomatic and seem to be tolerating the lower heart rates.   Physical Exam   Triage Vital Signs: ED Triage Vitals  Encounter Vitals Group     BP 02/03/24 0935 136/78     Girls Systolic BP Percentile --      Girls Diastolic BP Percentile --      Boys Systolic BP Percentile --      Boys Diastolic BP Percentile --      Pulse Rate 02/03/24 0935 84     Resp 02/03/24 0935 19     Temp 02/03/24 0935 98 F (36.7 C)     Temp src --      SpO2 02/03/24 0935 100 %     Weight 02/03/24 0934 143 lb (64.9 kg)     Height 02/03/24 0934 5' 4 (1.626 m)     Head Circumference --      Peak Flow --      Pain Score 02/03/24 0934 2     Pain Loc --      Pain Education --      Exclude from Growth Chart --     Most recent vital  signs: Vitals:   02/03/24 0935  BP: 136/78  Pulse: 84  Resp: 19  Temp: 98 F (36.7 C)  SpO2: 100%     General: Awake, no distress.  CV:  Good peripheral perfusion.  No chest wall tenderness.  No rash Resp:  Normal effort.  Clear lungs Abd:  No distention.  Soft and nontender Other:  Good pulses throughout No swelling leg no calf tenderness  ED Results / Procedures / Treatments   Labs (all labs ordered are listed, but only abnormal results are displayed) Labs Reviewed  CBC - Abnormal; Notable for the following components:      Result Value   RBC 3.66 (*)    Hemoglobin 11.1 (*)    HCT 34.9 (*)    All other components within normal limits  PROTIME-INR - Abnormal; Notable for the following components:   Prothrombin Time 23.1 (*)    INR 1.9 (*)    All other components within normal limits  BASIC METABOLIC  PANEL WITH GFR  HEPATIC FUNCTION PANEL  LIPASE, BLOOD  BRAIN NATRIURETIC PEPTIDE  TROPONIN I (HIGH SENSITIVITY)  TROPONIN I (HIGH SENSITIVITY)     EKG  My interpretation of EKG:  Sinus rhythm 79 without any ST elevation or T wave inversions, normal levels  RADIOLOGY I have reviewed the xray personally and interpreted no evidence of any pneumonia   PROCEDURES:  Critical Care performed: No  .1-3 Lead EKG Interpretation  Performed by: Ernest Ronal BRAVO, MD Authorized by: Ernest Ronal BRAVO, MD     Interpretation: normal     ECG rate:  80   ECG rate assessment: normal     Rhythm: sinus rhythm     Ectopy: none     Conduction: normal      MEDICATIONS ORDERED IN ED: Medications  sodium chloride  0.9 % bolus 500 mL (500 mLs Intravenous New Bag/Given 02/03/24 1150)     IMPRESSION / MDM / ASSESSMENT AND PLAN / ED COURSE  I reviewed the triage vital signs and the nursing notes.   Patient's presentation is most consistent with acute presentation with potential threat to life or bodily function.   Patient comes in with concerns for palpitations with some chest  pain.  Workup was done to evaluate for ACS, electrolyte abnormalities.  I wonder if this could be secondary to patient coming off of her metoprolol  back in June and she may need to be reinitiated on it given she was asymptomatic with the heart rates in the 50s.  She might be going in and out of A-fib now that she has been taken off her metoprolol .  I concern the possibility of PE but patient is on warfarin and she really denies any shortness of breath or chest pain at this time is only when she feels the palpitations.  No evidence of DVT on examination.  History and physical exam not consistent with dissection.  CBC shows stable hemoglobin white count is normal.  INR is 1.9 so slightly low.  BNP is only slightly elevated and chest x-ray without any evidence of any edema.  Her creatinine is slightly elevated she has had some elevation in the past little bit higher than it was last checked 8 months ago.  Troponin is slightly elevated at 32 but this could be from her CKD  12:21 PM  I was back in the room patient's heart rates were back down to 50s and so I did discuss with her that I concerned that she could just be a little dehydrated given her heart rates were in the 80s it was still sinus here although she could be going into A-fib I am hesitant to restart her metoprolol  given her heart rates when she is resting in bed or down into the 50s this could cause symptomatic bradycardia.  She also wanted to hold off on initiating metoprolol  back.  We discussed the need to follow-up with cardiology to get Holter monitor.  She also reports being under a lot of stress and not doing her normal daily routines as they have a second dog at home and she does report not eating or drinking as much show will trial some fluids.  At this time she remains asymptomatic and she understands that if repeat troponin is stable the patient can be discharged home with follow-up with cardiology  Troponin did increase by 6.  We discussed  that typically the delta is 5 or less and she is right on the borderline.  We discussed and offered patient  admission to the hospital for cardiac monitoring, echo but she reports feeling much improved and she denies any chest pain or shortness of breath at this time.  This seems less likely consistent with ACS. The elevation could just be slightly elevated if she did go into A-fib earlier today or from her CKD.  She preferred to follow-up outpatient with cardiology.  I did message Dr. Mady from cardiology to help facilitate outpatient follow-up and they are going to get her in as soon as they can.   Pt preferred outpt follow-up -understands I cannot predict future heart attacks so if she develops any change or worsening symptoms that she would need to return to the emergency room for repeat evaluation.  Patient's family members also at bedside who witnesses this conversation.   The patient is on the cardiac monitor to evaluate for evidence of arrhythmia and/or significant heart rate changes.      FINAL CLINICAL IMPRESSION(S) / ED DIAGNOSES   Final diagnoses:  Palpitations     Rx / DC Orders   ED Discharge Orders     None        Note:  This document was prepared using Dragon voice recognition software and may include unintentional dictation errors.   Ernest Ronal BRAVO, MD 02/03/24 1222    Ernest Ronal BRAVO, MD 02/03/24 [phone]

## 2024-02-03 NOTE — Telephone Encounter (Signed)
 Pt c/o of Chest Pain: STAT if active (IN THIS MOMENT) CP, including tightness, pressure, jaw pain, shoulder/upper arm/back pain, SOB, nausea, and vomiting.  1. Are you having CP right now (tightness, pressure, or discomfort)? Yes  2. Are you experiencing any other symptoms (ex. SOB, nausea, vomiting, sweating)? Lightheaded, tired, cold  3. How long have you been experiencing CP? 1 week  4. Is your CP continuous or coming and going? Comes and goes  5. Have you taken Nitroglycerin? No  6. If CP returns before callback, please consider calling 911.   BP is continuously around 148/73 HR is around 85-88

## 2024-02-03 NOTE — ED Notes (Addendum)
 RN Heidy called and informed of pt coming to the room.

## 2024-02-03 NOTE — ED Triage Notes (Addendum)
 Pt comes with cp, sob and palpitations for about 7 days now. Pt stats it has gotten worse. Pt states pain is under her breast. Pt has hx of afib. Pt is on thinner. Pt stats HR got over 100 on the way here and was in 80s during the night.

## 2024-02-06 ENCOUNTER — Encounter: Payer: Self-pay | Admitting: Cardiology

## 2024-02-06 ENCOUNTER — Ambulatory Visit: Attending: Cardiology | Admitting: Cardiology

## 2024-02-06 VITALS — BP 122/58 | HR 65 | Ht 63.0 in | Wt 147.2 lb

## 2024-02-06 DIAGNOSIS — E7800 Pure hypercholesterolemia, unspecified: Secondary | ICD-10-CM | POA: Insufficient documentation

## 2024-02-06 DIAGNOSIS — I10 Essential (primary) hypertension: Secondary | ICD-10-CM | POA: Insufficient documentation

## 2024-02-06 DIAGNOSIS — R079 Chest pain, unspecified: Secondary | ICD-10-CM | POA: Insufficient documentation

## 2024-02-06 DIAGNOSIS — I480 Paroxysmal atrial fibrillation: Secondary | ICD-10-CM | POA: Diagnosis present

## 2024-02-06 MED ORDER — METOPROLOL SUCCINATE ER 25 MG PO TB24: 12.5000 mg | ORAL_TABLET | Freq: Every day | ORAL | 3 refills | Status: AC

## 2024-02-06 NOTE — Patient Instructions (Signed)
 Medication Instructions:  - STOP amlodipine   - START Toprol  XL 12.5 mg daily   *If you need a refill on your cardiac medications before your next appointment, please call your pharmacy*  Lab Work: No labs ordered today  If you have labs (blood work) drawn today and your tests are completely normal, you will receive your results only by: MyChart Message (if you have MyChart) OR A paper copy in the mail If you have any lab test that is abnormal or we need to change your treatment, we will call you to review the results.  Testing/Procedures: No test ordered today   Follow-Up: At [facility], you and your health needs are our priority.  As part of our continuing mission to provide you with exceptional heart care, our providers are all part of one team.  This team includes your primary Cardiologist (physician) and Advanced Practice Providers or APPs (Physician Assistants and Nurse Practitioners) who all work together to provide you with the care you need, when you need it.  Your next appointment:   2 month(s)  Provider:   You may see Redell Cave, MD  We recommend signing up for the patient portal called MyChart.  Sign up information is provided on this After Visit Summary.  MyChart is used to connect with patients for Virtual Visits (Telemedicine).  Patients are able to view lab/test results, encounter notes, upcoming appointments, etc.  Non-urgent messages can be sent to your provider as well.   To learn more about what you can do with MyChart, go to [email or web address].

## 2024-02-06 NOTE — Progress Notes (Signed)
 Cardiology Office Note:    Date:  02/06/2024   ID:  Ana Cruz, DOB 09-15-1933, MRN [id number]  PCP:  Ana Verneita LITTIE, MD   [facility] Providers Cardiologist:  Redell Cave, MD     Referring MD: Ana Verneita LITTIE, MD   Chief Complaint  Patient presents with   Follow-up    ED follow up pt has been doing well with no complaints of chest pain, chest pressure or SOB, medciation reviewed verbally with patient    History of Present Illness:    Ana Cruz is a 89 y.o. female with a hx of hypertension, hyperlipidemia, paroxysmal atrial fibrillation who presents for follow-up.  Presented to the ED 3 days ago with symptoms of chest pain and palpitations.  Toprol -XL previously stopped about 3 months ago due to bradycardia with heart rates in the 50s.  Workup in the ED EKG and troponins were unrevealing.  Endorsed slight dizziness.  Compliant with Coumadin  as prescribed.   Prior notes/studies Echo 5/24 EF 60 to 65% Echo at [facility] 05/2021 EF 55%, mild MR  Past Medical History:  Diagnosis Date   allergic rhinitis    Atrial fibrillation (HCC)    Bilateral sacral insufficiency fracture with routine healing 08/21/2015   Chronic kidney disease, stage I    mild, did improve with cessation of NSAIDs   Elbow fracture, left    repaired Jan 2000   Hyperlipidemia    Hypertension    hypothyroidism    medicated since age 19, no prior surgery   Spinal stenosis    has L5 disk herniation with nerve root displacement    Past Surgical History:  Procedure Laterality Date   ABDOMINAL HYSTERECTOMY  1979   APPENDECTOMY  1969   BILATERAL OOPHORECTOMY     squential   BLADDER REPAIR  1980   OVARIAN CYST REMOVAL  April 1969   REPLACEMENT TOTAL KNEE BILATERAL  2009   reverse shoulder surgery  04/05/2016   ROTATOR CUFF REPAIR  Jan 2000   ROTATOR CUFF REPAIR  2000    Current Medications: Current Meds  Medication Sig   acetaminophen  (TYLENOL ) 500 MG tablet Take 500  mg by mouth every 6 (six) hours as needed (As needed for pain).   amoxicillin  (AMOXIL ) 500 MG capsule Take 2,000 mg by mouth as needed. Dental visits only   Artificial Tear Ointment (EYE LUBRICANT OP) Apply 1 drop to eye 2 (two) times daily.   Cholecalciferol (VITAMIN D3) 1000 UNITS CAPS Take 1,000 Units by mouth daily.   fluticasone  (FLONASE ) 50 MCG/ACT nasal spray Place 1 spray into both nostrils daily.   levothyroxine  (SYNTHROID ) 75 MCG tablet TAKE 1 TABLET BY MOUTH EVERY DAY   metoprolol  succinate (TOPROL  XL) 25 MG 24 hr tablet Take 0.5 tablets (12.5 mg total) by mouth daily.   Probiotic Product (PROBIOTIC-10) CHEW Chew 1 tablet by mouth daily.   rosuvastatin  (CRESTOR ) 20 MG tablet Take 1 tablet (20 mg total) by mouth daily.   telmisartan  (MICARDIS ) 80 MG tablet TAKE 1 TABLET BY MOUTH EVERY DAY   vitamin B-12 (CYANOCOBALAMIN ) 1000 MCG tablet Take 1 tablet (1,000 mcg total) by mouth daily.   vitamin E 180 MG (400 UNITS) capsule Take 800 Units by mouth daily.   warfarin (COUMADIN ) 1 MG tablet TAKE 1 TABLET TO 2 TABLETS DAILY OR AS PRESCRIBED BY COUMADIN  CLINIC   [DISCONTINUED] amLODipine  (NORVASC ) 5 MG tablet TAKE 1 TABLET (5 MG TOTAL) BY MOUTH DAILY.     Allergies:  Pamelor  [nortriptyline ], Chocolate flavoring agent (non-screening), Clindamycin/lincomycin, Onion, Other, Tape, and Hctz [hydrochlorothiazide ]   Social History   Socioeconomic History   Marital status: Widowed    Spouse name: Not on file   Number of children: 4   Years of education: 13.5   Highest education level: Some college, no degree  Occupational History   Occupation: retired  Tobacco Use   Smoking status: Never   Smokeless tobacco: Never  Vaping Use   Vaping status: Never Used  Substance and Sexual Activity   Alcohol use: Not Currently   Drug use: No   Sexual activity: Not on file  Other Topics Concern   Not on file  Social History Narrative   Married    [place]    Right handed   One story    Social Drivers of Health   Financial Resource Strain: Low Risk  (11/09/2023)   Overall Financial Resource Strain (CARDIA)    Difficulty of Paying Living Expenses: Not hard at all  Food Insecurity: No Food Insecurity (11/09/2023)   Hunger Vital Sign    Worried About Running Out of Food in the Last Year: Never true    Ran Out of Food in the Last Year: Never true  Transportation Needs: No Transportation Needs (11/09/2023)   PRAPARE - Administrator, Civil Service (Medical): No    Lack of Transportation (Non-Medical): No  Physical Activity: Insufficiently Active (11/09/2023)   Exercise Vital Sign    Days of Exercise per Week: 1 day    Minutes of Exercise per Session: 30 min  Stress: No Stress Concern Present (11/09/2023)   [employer] of Occupational Health - Occupational Stress Questionnaire    Feeling of Stress : Only a little  Social Connections: Moderately Integrated (11/09/2023)   Social Connection and Isolation Panel    Frequency of Communication with Friends and Family: Three times a week    Frequency of Social Gatherings with Friends and Family: Twice a week    Attends Religious Services: More than 4 times per year    Active Member of [employer] or Organizations: Yes    Attends Banker Meetings: 1 to 4 times per year    Marital Status: Widowed     Family History: The patient's family history includes Cancer (age of onset: 17) in her paternal grandfather; Dementia in her mother; Heart disease (age of onset: 67) in her father; Hypertension in her mother. There is no history of Breast cancer.  ROS:   Please see the history of present illness.     All other systems reviewed and are negative.  EKGs/Labs/Other Studies Reviewed:    The following studies were reviewed today:        Recent Labs: 11/13/2023: TSH 0.975 02/03/2024: ALT 15; B Natriuretic Peptide 176.8; BUN 53; Creatinine, Ser 1.45; Hemoglobin 11.1; Platelets 234; Potassium 4.5; Sodium 139  Recent  Lipid Panel    Component Value Date/Time   CHOL 152 11/13/2023 1046   TRIG 61.0 11/13/2023 1046   HDL 72.40 11/13/2023 1046   CHOLHDL 2 11/13/2023 1046   VLDL 12.2 11/13/2023 1046   LDLCALC 67 11/13/2023 1046   LDLCALC 51 05/12/2023 0839   LDLDIRECT 114.0 05/30/2022 1041     Risk Assessment/Calculations:             Physical Exam:    VS:  BP (!) 122/58 (BP Location: Left Arm, Patient Position: Sitting, Cuff Size: Normal)   Pulse 65   Ht 5' 3 (  1.6 m)   Wt 147 lb 3.2 oz (66.8 kg)   SpO2 98%   BMI 26.08 kg/m     Wt Readings from Last 3 Encounters:  02/06/24 147 lb 3.2 oz (66.8 kg)  02/03/24 143 lb (64.9 kg)  11/18/23 150 lb (68 kg)     GEN:  Well nourished, well developed in no acute distress HEENT: Normal NECK: No JVD; No carotid bruits CARDIAC: Regular rate and rhythm RESPIRATORY:  Clear to auscultation without rales, wheezing or rhonchi  ABDOMEN: Soft, non-tender, non-distended MUSCULOSKELETAL:  No edema; No deformity  SKIN: Warm and dry NEUROLOGIC:  Alert and oriented x 3 PSYCHIATRIC:  Normal affect   ASSESSMENT:    1. Chest pain, unspecified type   2. Paroxysmal atrial fibrillation (HCC)   3. Primary hypertension   4. Pure hypercholesterolemia    PLAN:    In order of problems listed above:  Chest pain associated with palpitations, likely paroxysms of atrial fibrillation occurring after stopping Toprol -XL.  Rhythm regular on exam today.  Restart Toprol -XL 12.5 mg daily.  Stop amlodipine  due to slightly low diastolic BP.  Consider Toprol -XL every other day if patient becomes bradycardic. Paroxysmal atrial fibrillation, asymptomatic with no palpitations.  Echo 5/24 EF 60 to 65%.  EKG today shows sinus bradycardia heart rate 50. stop Toprol -XL 12.5 mg daily.  Continue warfarin.  Hypertension, Diastolic BP low.  Stop Norvasc  5 mg daily.  Start Toprol -XL 12.5 mg daily as above, telmisartan  80 mg daily. Hyperlipidemia, low-cholesterol diet, statin  okay.  Follow-up in 6 to 8 weeks.     Medication Adjustments/Labs and Tests Ordered: Current medicines are reviewed at length with the patient today.  Concerns regarding medicines are outlined above.  No orders of the defined types were placed in this encounter.  Meds ordered this encounter  Medications   metoprolol  succinate (TOPROL  XL) 25 MG 24 hr tablet    Sig: Take 0.5 tablets (12.5 mg total) by mouth daily.    Dispense:  30 tablet    Refill:  3    Patient Instructions  Medication Instructions:  - STOP amlodipine   - START Toprol  XL 12.5 mg daily   *If you need a refill on your cardiac medications before your next appointment, please call your pharmacy*  Lab Work: No labs ordered today  If you have labs (blood work) drawn today and your tests are completely normal, you will receive your results only by: MyChart Message (if you have MyChart) OR A paper copy in the mail If you have any lab test that is abnormal or we need to change your treatment, we will call you to review the results.  Testing/Procedures: No test ordered today   Follow-Up: At [facility], you and your health needs are our priority.  As part of our continuing mission to provide you with exceptional heart care, our providers are all part of one team.  This team includes your primary Cardiologist (physician) and Advanced Practice Providers or APPs (Physician Assistants and Nurse Practitioners) who all work together to provide you with the care you need, when you need it.  Your next appointment:   2 month(s)  Provider:   You may see Redell Cave, MD  We recommend signing up for the patient portal called MyChart.  Sign up information is provided on this After Visit Summary.  MyChart is used to connect with patients for Virtual Visits (Telemedicine).  Patients are able to view lab/test results, encounter notes, upcoming appointments, etc.  Non-urgent  messages can be sent to your provider as  well.   To learn more about what you can do with MyChart, go to [email or web address].         Signed, Redell Cave, MD  02/06/2024 12:56 PM    [facility]

## 2024-02-09 ENCOUNTER — Ambulatory Visit (INDEPENDENT_AMBULATORY_CARE_PROVIDER_SITE_OTHER): Admitting: Podiatry

## 2024-02-09 DIAGNOSIS — M79675 Pain in left toe(s): Secondary | ICD-10-CM

## 2024-02-09 DIAGNOSIS — B351 Tinea unguium: Secondary | ICD-10-CM

## 2024-02-09 DIAGNOSIS — M79674 Pain in right toe(s): Secondary | ICD-10-CM | POA: Diagnosis not present

## 2024-02-11 ENCOUNTER — Encounter: Payer: Self-pay | Admitting: Podiatry

## 2024-02-11 NOTE — Progress Notes (Signed)
  Subjective:  Patient ID: Ana Cruz, female    DOB: 08/22/1933,  MRN: [id number]  89 y.o. female presents painful mycotic toenails x 10 which interfere with daily activities. Pain is relieved with periodic professional debridement. Chief Complaint  Patient presents with   Nail Problem    Thick painful toenails, 3 month follow up    New problem(s): None   PCP is Marylynn Verneita LITTIE, MD , and last visit was November 13, 2023.  Allergies  Allergen Reactions   Pamelor  [Nortriptyline ] Other (See Comments)    Urinary incontinence   Chocolate Flavoring Agent (Non-Screening) Hives   Clindamycin/Lincomycin     Tongue swelled,  Throat felt funny   Onion Swelling    Throat swelling   Other Hives    Raw Onions, Spring Time causes eye itching and runny nose. *Pt. Also Had Large Blisters from Tourniquets applied to her legs during her knee replacement surgery*   Tape    Hctz [Hydrochlorothiazide ] Rash    rash    Review of Systems: Negative except as noted in the HPI.   Objective:  Ana Cruz is a pleasant 89 y.o. female WD, WN in NAD. AAO x 3.  Vascular Examination: Vascular status intact b/l with palpable pedal pulses. CFT immediate b/l. Pedal hair present. No edema. No pain with calf compression b/l. Skin temperature gradient WNL b/l. No varicosities noted. No cyanosis or clubbing noted.  Neurological Examination: Sensation grossly intact b/l with 10 gram monofilament. Vibratory sensation intact b/l.  Dermatological Examination: Pedal skin with normal turgor, texture and tone b/l. No open wounds nor interdigital macerations noted. Toenails 1-5 b/l thick, discolored, elongated with subungual debris and pain on dorsal palpation. No corns, calluses nor porokeratotic lesions noted.  Musculoskeletal Examination: Muscle strength 5/5 to b/l LE.  No pain, crepitus noted b/l. Hammertoe deformity noted 2-5 b/l.  Radiographs: None  Last A1c:      Latest Ref Rng & Units  11/13/2023   10:46 AM  Hemoglobin A1C  Hemoglobin-A1c 4.6 - 6.5 % 6.0     Assessment:   1. Pain due to onychomycosis of toenails of both feet    Plan:  Patient was evaluated and treated. All patient's and/or POA's questions/concerns addressed on today's visit. Mycotic toenails 1-5 debrided in length and girth without incident. Continue soft, supportive shoe gear daily. Report any pedal injuries to medical professional. Call office if there are any quesitons/concerns. -Patient/POA to call should there be question/concern in the interim.  Return in about 3 months (around 05/10/2024).  Delon LITTIE Merlin, DPM      [facility] LOCATION: [address] [place] [address]                   Office [phone]   [facility] LOCATION: [address] [place], [place] [address] Office [phone]

## 2024-02-18 ENCOUNTER — Ambulatory Visit: Attending: Cardiology

## 2024-02-18 DIAGNOSIS — I480 Paroxysmal atrial fibrillation: Secondary | ICD-10-CM | POA: Insufficient documentation

## 2024-02-18 DIAGNOSIS — Z7901 Long term (current) use of anticoagulants: Secondary | ICD-10-CM | POA: Diagnosis present

## 2024-02-18 LAB — POCT INR: INR: 1.9 — AB (ref 2.0–3.0)

## 2024-02-18 NOTE — Patient Instructions (Signed)
 Take 2 tablets today only then Continue 1.5 tablets Daily, except 1 tablet every Tuesday and Thursday.  INR in 3 weeks.  Eat 2 helpings of greens per week.     [phone]

## 2024-02-21 ENCOUNTER — Other Ambulatory Visit: Payer: Self-pay | Admitting: Internal Medicine

## 2024-03-10 ENCOUNTER — Ambulatory Visit: Attending: Cardiology

## 2024-03-10 DIAGNOSIS — Z7901 Long term (current) use of anticoagulants: Secondary | ICD-10-CM | POA: Insufficient documentation

## 2024-03-10 DIAGNOSIS — I480 Paroxysmal atrial fibrillation: Secondary | ICD-10-CM | POA: Insufficient documentation

## 2024-03-10 LAB — POCT INR: INR: 2 (ref 2.0–3.0)

## 2024-03-10 NOTE — Patient Instructions (Signed)
 Continue 1.5 tablets Daily, except 1 tablet every Tuesday and Thursday.  INR in 5 weeks.  Eat 2 helpings of greens per week.

## 2024-03-18 ENCOUNTER — Encounter: Payer: Self-pay | Admitting: Internal Medicine

## 2024-04-09 ENCOUNTER — Encounter: Payer: Self-pay | Admitting: Cardiology

## 2024-04-09 ENCOUNTER — Ambulatory Visit: Attending: Cardiology | Admitting: Cardiology

## 2024-04-09 VITALS — BP 130/58 | HR 62 | Ht 63.0 in | Wt 146.8 lb

## 2024-04-09 DIAGNOSIS — I10 Essential (primary) hypertension: Secondary | ICD-10-CM | POA: Diagnosis present

## 2024-04-09 DIAGNOSIS — I480 Paroxysmal atrial fibrillation: Secondary | ICD-10-CM | POA: Insufficient documentation

## 2024-04-09 NOTE — Progress Notes (Signed)
 Cardiology Office Note:    Date:  04/09/2024   ID:  Ana Cruz, DOB 01/17/1934, MRN [id number]  PCP:  Marylynn Verneita LITTIE, MD   [facility] Providers Cardiologist:  Redell Cave, MD     Referring MD: Marylynn Verneita LITTIE, MD   Chief Complaint  Patient presents with   Follow-up    2 month follow up pt has been doing well with no complaints of chest pain, chest pressure or SOB, medication reviewed verbally with patient.     History of Present Illness:    Ana Cruz is a 89 y.o. female with a hx of hypertension, hyperlipidemia, paroxysmal atrial fibrillation who presents for follow-up.  Last seen with episodes of heart irregularities and slight dizziness.  Norvasc  was stopped, Toprol -XL 12.5 mg daily was started.  States feeling much better, denies any symptoms of palpitations, also feels less dizzy with stopping amlodipine .  Overall feels well, has no concerns at this time.  Prior notes/studies Echo 5/24 EF 60 to 65% Echo at [facility] 05/2021 EF 55%, mild MR  Past Medical History:  Diagnosis Date   allergic rhinitis    Atrial fibrillation (HCC)    Bilateral sacral insufficiency fracture with routine healing 08/21/2015   Chronic kidney disease, stage I    mild, did improve with cessation of NSAIDs   Elbow fracture, left    repaired Jan 2000   Hyperlipidemia    Hypertension    hypothyroidism    medicated since age 87, no prior surgery   Spinal stenosis    has L5 disk herniation with nerve root displacement    Past Surgical History:  Procedure Laterality Date   ABDOMINAL HYSTERECTOMY  1979   APPENDECTOMY  1969   BILATERAL OOPHORECTOMY     squential   BLADDER REPAIR  1980   OVARIAN CYST REMOVAL  April 1969   REPLACEMENT TOTAL KNEE BILATERAL  2009   reverse shoulder surgery  04/05/2016   ROTATOR CUFF REPAIR  Jan 2000   ROTATOR CUFF REPAIR  2000    Current Medications: Current Meds  Medication Sig   acetaminophen  (TYLENOL ) 500 MG tablet Take  500 mg by mouth every 6 (six) hours as needed (As needed for pain).   amoxicillin  (AMOXIL ) 500 MG capsule Take 2,000 mg by mouth as needed. Dental visits only   Artificial Tear Ointment (EYE LUBRICANT OP) Apply 1 drop to eye 2 (two) times daily.   Cholecalciferol (VITAMIN D3) 1000 UNITS CAPS Take 1,000 Units by mouth daily.   fluticasone  (FLONASE ) 50 MCG/ACT nasal spray Place 1 spray into both nostrils daily.   levothyroxine  (SYNTHROID ) 75 MCG tablet TAKE 1 TABLET BY MOUTH EVERY DAY   metoprolol  succinate (TOPROL  XL) 25 MG 24 hr tablet Take 0.5 tablets (12.5 mg total) by mouth daily.   Probiotic Product (PROBIOTIC-10) CHEW Chew 1 tablet by mouth daily.   rosuvastatin  (CRESTOR ) 20 MG tablet Take 1 tablet (20 mg total) by mouth daily.   telmisartan  (MICARDIS ) 80 MG tablet TAKE 1 TABLET BY MOUTH EVERY DAY   vitamin B-12 (CYANOCOBALAMIN ) 1000 MCG tablet Take 1 tablet (1,000 mcg total) by mouth daily.   vitamin E 180 MG (400 UNITS) capsule Take 800 Units by mouth daily.   warfarin (COUMADIN ) 1 MG tablet TAKE 1 TABLET TO 2 TABLETS DAILY OR AS PRESCRIBED BY COUMADIN  CLINIC     Allergies:   Pamelor  [nortriptyline ], Chocolate flavoring agent (non-screening), Clindamycin/lincomycin, Onion, Other, Tape, and Hctz [hydrochlorothiazide ]   Social History   Socioeconomic  History   Marital status: Widowed    Spouse name: Not on file   Number of children: 4   Years of education: 13.5   Highest education level: Some college, no degree  Occupational History   Occupation: retired  Tobacco Use   Smoking status: Never   Smokeless tobacco: Never  Vaping Use   Vaping status: Never Used  Substance and Sexual Activity   Alcohol use: Not Currently   Drug use: No   Sexual activity: Not on file  Other Topics Concern   Not on file  Social History Narrative   Married    [place]    Right handed   One story   Social Drivers of Health   Financial Resource Strain: Low Risk  (11/09/2023)   Overall  Financial Resource Strain (CARDIA)    Difficulty of Paying Living Expenses: Not hard at all  Food Insecurity: No Food Insecurity (11/09/2023)   Hunger Vital Sign    Worried About Running Out of Food in the Last Year: Never true    Ran Out of Food in the Last Year: Never true  Transportation Needs: No Transportation Needs (11/09/2023)   PRAPARE - Administrator, Civil Service (Medical): No    Lack of Transportation (Non-Medical): No  Physical Activity: Insufficiently Active (11/09/2023)   Exercise Vital Sign    Days of Exercise per Week: 1 day    Minutes of Exercise per Session: 30 min  Stress: No Stress Concern Present (11/09/2023)   [employer] of Occupational Health - Occupational Stress Questionnaire    Feeling of Stress : Only a little  Social Connections: Moderately Integrated (11/09/2023)   Social Connection and Isolation Panel    Frequency of Communication with Friends and Family: Three times a week    Frequency of Social Gatherings with Friends and Family: Twice a week    Attends Religious Services: More than 4 times per year    Active Member of [employer] or Organizations: Yes    Attends Banker Meetings: 1 to 4 times per year    Marital Status: Widowed     Family History: The patient's family history includes Cancer (age of onset: 61) in her paternal grandfather; Dementia in her mother; Heart disease (age of onset: 75) in her father; Hypertension in her mother. There is no history of Breast cancer.  ROS:   Please see the history of present illness.     All other systems reviewed and are negative.  EKGs/Labs/Other Studies Reviewed:    The following studies were reviewed today:        Recent Labs: 11/13/2023: TSH 0.975 02/03/2024: ALT 15; B Natriuretic Peptide 176.8; BUN 53; Creatinine, Ser 1.45; Hemoglobin 11.1; Platelets 234; Potassium 4.5; Sodium 139  Recent Lipid Panel    Component Value Date/Time   CHOL 152 11/13/2023 1046   TRIG 61.0  11/13/2023 1046   HDL 72.40 11/13/2023 1046   CHOLHDL 2 11/13/2023 1046   VLDL 12.2 11/13/2023 1046   LDLCALC 67 11/13/2023 1046   LDLCALC 51 05/12/2023 0839   LDLDIRECT 114.0 05/30/2022 1041     Risk Assessment/Calculations:             Physical Exam:    VS:  BP (!) 130/58 (BP Location: Left Arm, Patient Position: Sitting, Cuff Size: Normal)   Pulse 62   Ht 5' 3 (1.6 m)   Wt 146 lb 12.8 oz (66.6 kg)   SpO2 96%   BMI 26.00 kg/m  Wt Readings from Last 3 Encounters:  04/09/24 146 lb 12.8 oz (66.6 kg)  02/06/24 147 lb 3.2 oz (66.8 kg)  02/03/24 143 lb (64.9 kg)     GEN:  Well nourished, well developed in no acute distress HEENT: Normal NECK: No JVD; No carotid bruits CARDIAC: Regular rate and rhythm RESPIRATORY:  Clear to auscultation without rales, wheezing or rhonchi  ABDOMEN: Soft, non-tender, non-distended MUSCULOSKELETAL:  No edema; No deformity  SKIN: Warm and dry NEUROLOGIC:  Alert and oriented x 3 PSYCHIATRIC:  Normal affect   ASSESSMENT:    1. Paroxysmal atrial fibrillation (HCC)   2. Primary hypertension    PLAN:    In order of problems listed above:  Paroxysmal atrial fibrillation, palpitations resolved with low-dose beta-blocker.  Echo 5/24 EF 60 to 65%.  Continue Toprol -XL 12.5 mg daily.  Continue warfarin.  Hypertension, Diastolic BP low.  Continue Toprol -XL 12.5 mg daily, telmisartan  80 mg daily.  Follow-up in 6 months     Medication Adjustments/Labs and Tests Ordered: Current medicines are reviewed at length with the patient today.  Concerns regarding medicines are outlined above.  No orders of the defined types were placed in this encounter.  No orders of the defined types were placed in this encounter.   Patient Instructions  Medication Instructions:  Your physician recommends that you continue on your current medications as directed. Please refer to the Current Medication list given to you today.   *If you need a refill on your  cardiac medications before your next appointment, please call your pharmacy*  Lab Work: No labs ordered today  If you have labs (blood work) drawn today and your tests are completely normal, you will receive your results only by: MyChart Message (if you have MyChart) OR A paper copy in the mail If you have any lab test that is abnormal or we need to change your treatment, we will call you to review the results.  Testing/Procedures: No test ordered today   Follow-Up: At [facility], you and your health needs are our priority.  As part of our continuing mission to provide you with exceptional heart care, our providers are all part of one team.  This team includes your primary Cardiologist (physician) and Advanced Practice Providers or APPs (Physician Assistants and Nurse Practitioners) who all work together to provide you with the care you need, when you need it.  Your next appointment:   6 month(s)  Provider:   You may see Redell Cave, MD or one of the following Advanced Practice Providers on your designated Care Team:   Lonni Meager, NP Lesley Maffucci, PA-C Bernardino Bring, PA-C Cadence [place], PA-C Tylene Lunch, NP Barnie Hila, NP    We recommend signing up for the patient portal called MyChart.  Sign up information is provided on this After Visit Summary.  MyChart is used to connect with patients for Virtual Visits (Telemedicine).  Patients are able to view lab/test results, encounter notes, upcoming appointments, etc.  Non-urgent messages can be sent to your provider as well.   To learn more about what you can do with MyChart, go to [email or web address].          Signed, Redell Cave, MD  04/09/2024 3:13 PM    [facility]

## 2024-04-09 NOTE — Patient Instructions (Signed)

## 2024-04-14 ENCOUNTER — Ambulatory Visit: Attending: Cardiology

## 2024-04-14 DIAGNOSIS — I480 Paroxysmal atrial fibrillation: Secondary | ICD-10-CM | POA: Diagnosis present

## 2024-04-14 DIAGNOSIS — Z7901 Long term (current) use of anticoagulants: Secondary | ICD-10-CM | POA: Insufficient documentation

## 2024-04-14 LAB — POCT INR: INR: 2.6 (ref 2.0–3.0)

## 2024-04-14 NOTE — Patient Instructions (Signed)
 Continue 1.5 tablets Daily, except 1 tablet every Tuesday and Thursday.  INR in 5 weeks.  Eat 2 helpings of greens per week.     [phone]

## 2024-05-10 ENCOUNTER — Ambulatory Visit: Admitting: Podiatry

## 2024-05-10 DIAGNOSIS — Z91199 Patient's noncompliance with other medical treatment and regimen due to unspecified reason: Secondary | ICD-10-CM

## 2024-05-10 NOTE — Progress Notes (Signed)
 1. No-show for appointment

## 2024-05-11 ENCOUNTER — Ambulatory Visit: Admitting: *Deleted

## 2024-05-11 VITALS — BP 132/67 | HR 54 | Ht 62.0 in | Wt 143.0 lb

## 2024-05-11 DIAGNOSIS — Z1231 Encounter for screening mammogram for malignant neoplasm of breast: Secondary | ICD-10-CM

## 2024-05-11 DIAGNOSIS — Z0000 Encounter for general adult medical examination without abnormal findings: Secondary | ICD-10-CM

## 2024-05-11 NOTE — Progress Notes (Signed)
 Chief Complaint  Patient presents with   Medicare Wellness     Subjective:   Ana Cruz is a 89 y.o. female who presents for a Medicare Annual Wellness Visit.  Visit info / Clinical Intake: Medicare Wellness Visit Type:: Subsequent Annual Wellness Visit Persons participating in visit and providing information:: patient Medicare Wellness Visit Mode:: Telephone If telephone:: video declined Since this visit was completed virtually, some vitals may be partially provided or unavailable. Missing vitals are due to the limitations of the virtual format.: Documented vitals are patient reported If Telephone or Video please confirm:: I connected with patient using audio/video enable telemedicine. I verified patient identity with two identifiers, discussed telehealth limitations, and patient agreed to proceed. Patient Location:: Home Provider Location:: Office/Home Interpreter Needed?: No Pre-visit prep was completed: yes AWV questionnaire completed by patient prior to visit?: yes Date:: 05/07/24 Living arrangements:: (!) in retirement community; lives alone Patient's Overall Health Status Rating: (Patient-Rptd) very good Typical amount of pain: (Patient-Rptd) none Does pain affect daily life?: (Patient-Rptd) no Are you currently prescribed opioids?: no  Dietary Habits and Nutritional Risks How many meals a day?: (Patient-Rptd) 3 Eats fruit and vegetables daily?: (Patient-Rptd) yes Most meals are obtained by: (Patient-Rptd) preparing own meals In the last 2 weeks, have you had any of the following?: none Diabetic:: no  Functional Status Activities of Daily Living (to include ambulation/medication): (Patient-Rptd) Independent Ambulation: Independent with device- listed below Home Assistive Devices/Equipment: Cane Medication Administration: (Patient-Rptd) Independent Home Management (perform basic housework or laundry): (Patient-Rptd) Independent Manage your own finances?:  (Patient-Rptd) yes Primary transportation is: (Patient-Rptd) driving Concerns about vision?: no *vision screening is required for WTM* Concerns about hearing?: no  Fall Screening Falls in the past year?: (Patient-Rptd) 0 Number of falls in past year: 0 Was there an injury with Fall?: 0 Fall Risk Category Calculator: 0 Patient Fall Risk Level: Low Fall Risk  Fall Risk Patient at Risk for Falls Due to: No Fall Risks Fall risk Follow up: Falls evaluation completed; Falls prevention discussed  Home and Transportation Safety: All rugs have non-skid backing?: (Patient-Rptd) yes All stairs or steps have railings?: (Patient-Rptd) yes Grab bars in the bathtub or shower?: (Patient-Rptd) yes Have non-skid surface in bathtub or shower?: (Patient-Rptd) yes Good home lighting?: (Patient-Rptd) yes Regular seat belt use?: (Patient-Rptd) yes Hospital stays in the last year:: (Patient-Rptd) no  Cognitive Assessment Difficulty concentrating, remembering, or making decisions? : (Patient-Rptd) no Will 6CIT or Mini Cog be Completed: yes What year is it?: 0 points What month is it?: 0 points Give patient an address phrase to remember (5 components): [address], [place] About what time is it?: 0 points Count backwards from 20 to 1: 0 points Say the months of the year in reverse: 0 points Repeat the address phrase from earlier: 0 points 6 CIT Score: 0 points  Advance Directives (For Healthcare) Does Patient Have a Medical Advance Directive?: Yes Does patient want to make changes to medical advance directive?: No - Patient declined Type of Advance Directive: Healthcare Power of [place]; Living will Copy of Healthcare Power of Attorney in Chart?: Yes - validated most recent copy scanned in chart (See row information) Copy of Living Will in Chart?: Yes - validated most recent copy scanned in chart (See row information)  Reviewed/Updated  Reviewed/Updated: Reviewed All (Medical, Surgical,  Family, Medications, Allergies, Care Teams, Patient Goals)    Allergies (verified) Pamelor  [nortriptyline ], Chocolate flavoring agent (non-screening), Clindamycin/lincomycin, Onion, Other, Tape, and Hctz [hydrochlorothiazide ]  Current Medications (verified) Outpatient Encounter Medications as of 05/11/2024  Medication Sig   acetaminophen  (TYLENOL ) 500 MG tablet Take 500 mg by mouth every 6 (six) hours as needed (As needed for pain).   amoxicillin  (AMOXIL ) 500 MG capsule Take 2,000 mg by mouth as needed. Dental visits only   Artificial Tear Ointment (EYE LUBRICANT OP) Apply 1 drop to eye 2 (two) times daily.   Cholecalciferol (VITAMIN D3) 1000 UNITS CAPS Take 1,000 Units by mouth daily.   fluticasone  (FLONASE ) 50 MCG/ACT nasal spray Place 1 spray into both nostrils daily.   levothyroxine  (SYNTHROID ) 75 MCG tablet TAKE 1 TABLET BY MOUTH EVERY DAY   metoprolol  succinate (TOPROL  XL) 25 MG 24 hr tablet Take 0.5 tablets (12.5 mg total) by mouth daily.   Probiotic Product (PROBIOTIC-10) CHEW Chew 1 tablet by mouth daily.   rosuvastatin  (CRESTOR ) 20 MG tablet Take 1 tablet (20 mg total) by mouth daily.   telmisartan  (MICARDIS ) 80 MG tablet TAKE 1 TABLET BY MOUTH EVERY DAY   vitamin B-12 (CYANOCOBALAMIN ) 1000 MCG tablet Take 1 tablet (1,000 mcg total) by mouth daily.   vitamin E 180 MG (400 UNITS) capsule Take 800 Units by mouth daily.   warfarin (COUMADIN ) 1 MG tablet TAKE 1 TABLET TO 2 TABLETS DAILY OR AS PRESCRIBED BY COUMADIN  CLINIC   No facility-administered encounter medications on file as of 05/11/2024.    History: Past Medical History:  Diagnosis Date   allergic rhinitis    Atrial fibrillation (HCC)    Bilateral sacral insufficiency fracture with routine healing 08/21/2015   Chronic kidney disease, stage I    mild, did improve with cessation of NSAIDs   Elbow fracture, left    repaired Jan 2000   Hyperlipidemia    Hypertension    hypothyroidism    medicated since age 22, no  prior surgery   Spinal stenosis    has L5 disk herniation with nerve root displacement   Past Surgical History:  Procedure Laterality Date   ABDOMINAL HYSTERECTOMY  1979   APPENDECTOMY  1969   BILATERAL OOPHORECTOMY     squential   BLADDER REPAIR  1980   OVARIAN CYST REMOVAL  April 1969   REPLACEMENT TOTAL KNEE BILATERAL  2009   reverse shoulder surgery  04/05/2016   ROTATOR CUFF REPAIR  Jan 2000   ROTATOR CUFF REPAIR  2000   Family History  Problem Relation Age of Onset   Hypertension Mother    Dementia Mother    Heart disease Father 39       MI   Cancer Paternal Grandfather 16       colon   Breast cancer Neg Hx    Social History   Occupational History   Occupation: retired  Tobacco Use   Smoking status: Never   Smokeless tobacco: Never  Vaping Use   Vaping status: Never Used  Substance and Sexual Activity   Alcohol use: Not Currently   Drug use: No   Sexual activity: Not on file   Tobacco Counseling Counseling given: Not Answered  SDOH Screenings   Food Insecurity: No Food Insecurity (05/07/2024)  Housing: Low Risk  (05/11/2024)  Transportation Needs: No Transportation Needs (05/07/2024)  Utilities: Not At Risk (05/11/2024)  Alcohol Screen: Low Risk  (05/11/2024)  Depression (PHQ2-9): Low Risk  (05/11/2024)  Financial Resource Strain: Low Risk  (05/07/2024)  Physical Activity: Insufficiently Active (05/07/2024)  Social Connections: Moderately Integrated (05/07/2024)  Stress: No Stress Concern Present (05/07/2024)  Tobacco Use: Low Risk  (  05/11/2024)  Health Literacy: Adequate Health Literacy (05/11/2024)   See flowsheets for full screening details  Depression Screen PHQ 2 & 9 Depression Scale- Over the past 2 weeks, how often have you been bothered by any of the following problems? Little interest or pleasure in doing things: 0 Feeling down, depressed, or hopeless (PHQ Adolescent also includes...irritable): 0 PHQ-2 Total Score: 0 Trouble falling or staying  asleep, or sleeping too much: 0 Feeling tired or having little energy: 0 Poor appetite or overeating (PHQ Adolescent also includes...weight loss): 0 Feeling bad about yourself - or that you are a failure or have let yourself or your family down: 0 Trouble concentrating on things, such as reading the newspaper or watching television (PHQ Adolescent also includes...like school work): 0 Moving or speaking so slowly that other people could have noticed. Or the opposite - being so fidgety or restless that you have been moving around a lot more than usual: 0 Thoughts that you would be better off dead, or of hurting yourself in some way: 0 PHQ-9 Total Score: 0 If you checked off any problems, how difficult have these problems made it for you to do your work, take care of things at home, or get along with other people?: Not difficult at all     Goals Addressed             This Visit's Progress    Patient Stated       Wants to exercise more              Objective:    Today's Vitals   05/11/24 1400 05/11/24 1415  BP: (!) 142/61 132/67  Pulse: (!) 55 (!) 54  Weight: 143 lb (64.9 kg)   Height: 5' 2 (1.575 m)    Body mass index is 26.16 kg/m.  Hearing/Vision screen Hearing Screening - Comments:: No issues Vision Screening - Comments:: Glasses, [facility], up to date Immunizations and Health Maintenance Health Maintenance  Topic Date Due   Influenza Vaccine  01/02/2024   COVID-19 Vaccine (7 - 2025-26 season) 02/02/2024   Medicare Annual Wellness (AWV)  05/11/2025   DTaP/Tdap/Td (3 - Td or Tdap) 08/15/2031   Pneumococcal Vaccine: 50+ Years  Completed   Bone Density Scan  Completed   Zoster Vaccines- Shingrix  Completed   Meningococcal B Vaccine  Aged Out        Assessment/Plan:  This is a routine wellness examination for Ana Cruz.  Patient Care Team: Marylynn Verneita CROME, MD as PCP - General (Internal Medicine) Darliss Rogue, MD as PCP - Cardiology (Cardiology) Tobie Tonita POUR, DO as Consulting Physician (Neurology)  I have personally reviewed and noted the following in the patient's chart:   Medical and social history Use of alcohol, tobacco or illicit drugs  Current medications and supplements including opioid prescriptions. Functional ability and status Nutritional status Physical activity Advanced directives List of other physicians Hospitalizations, surgeries, and ER visits in previous 12 months Vitals Screenings to include cognitive, depression, and falls Referrals and appointments  No orders of the defined types were placed in this encounter.  In addition, I have reviewed and discussed with patient certain preventive protocols, quality metrics, and best practice recommendations. A written personalized care plan for preventive services as well as general preventive health recommendations were provided to patient.   Angeline Fredericks, LPN   [id number]   Return in 1 year (on 05/11/2025).  After Visit Summary: (MyChart) Due to this being a telephonic visit, the after visit  summary with patients personalized plan was offered to patient via MyChart   Nurse Notes: Mammogram ordered. Patient declines covid vaccine. Patient states that she had her flu shot at [facility] but no record in [facility]. Patient stated her heart rate today is normal for her.

## 2024-05-11 NOTE — Patient Instructions (Signed)
 Ana Cruz,  Thank you for taking the time for your Medicare Wellness Visit. I appreciate your continued commitment to your health goals. Please review the care plan we discussed, and feel free to reach out if I can assist you further.  Please note that Annual Wellness Visits do not include a physical exam. Some assessments may be limited, especially if the visit was conducted virtually. If needed, we may recommend an in-person follow-up with your provider.  Ongoing Care Seeing your primary care provider every 3 to 6 months helps us  monitor your health and provide consistent, personalized care.  Unable to find the date of your most recent flu vaccine that you received at [facility].  You have an order for:  []   2D Mammogram  [x]   3D Mammogram  []   Bone Density     Please call for appointment:  [facility]  [address] [place] [address] [phone]    Make sure to wear two-piece clothing.  No lotions, powders, or deodorants the day of the appointment. Make sure to bring picture ID and insurance card.  Bring list of medications you are currently taking including any supplements.    Referrals If a referral was made during today's visit and you haven't received any updates within two weeks, please contact the referred provider directly to check on the status.  Recommended Screenings:  Health Maintenance  Topic Date Due   Flu Shot  01/02/2024   COVID-19 Vaccine (7 - 2025-26 season) 02/02/2024   Breast Cancer Screening  08/31/2024   Medicare Annual Wellness Visit  05/11/2025   DTaP/Tdap/Td vaccine (3 - Td or Tdap) 08/15/2031   Pneumococcal Vaccine for age over 54  Completed   Osteoporosis screening with Bone Density Scan  Completed   Zoster (Shingles) Vaccine  Completed   Meningitis B Vaccine  Aged Out       05/07/2024   11:57 AM  Advanced Directives  Does Patient Have a Medical Advance Directive? Yes  Type  of Ana Cruz  Does patient want to make changes to medical advance directive? No - Patient declined  Copy of Healthcare Power of Attorney in Chart? Yes - validated most recent copy scanned in chart (See row information)    Vision: Annual vision screenings are recommended for early detection of glaucoma, cataracts, and diabetic retinopathy. These exams can also reveal signs of chronic conditions such as diabetes and high blood pressure.  Dental: Annual dental screenings help detect early signs of oral cancer, gum disease, and other conditions linked to overall health, including heart disease and diabetes.  Please see the attached documents for additional preventive care recommendations.

## 2024-05-14 ENCOUNTER — Other Ambulatory Visit: Payer: Self-pay | Admitting: Pharmacist

## 2024-05-14 ENCOUNTER — Ambulatory Visit: Admitting: Internal Medicine

## 2024-05-14 DIAGNOSIS — I480 Paroxysmal atrial fibrillation: Secondary | ICD-10-CM

## 2024-05-14 MED ORDER — WARFARIN SODIUM 1 MG PO TABS: ORAL_TABLET | ORAL | 0 refills | Status: DC

## 2024-05-17 ENCOUNTER — Ambulatory Visit: Admitting: Podiatry

## 2024-05-17 DIAGNOSIS — B351 Tinea unguium: Secondary | ICD-10-CM

## 2024-05-17 DIAGNOSIS — M79675 Pain in left toe(s): Secondary | ICD-10-CM

## 2024-05-17 DIAGNOSIS — M79674 Pain in right toe(s): Secondary | ICD-10-CM | POA: Diagnosis not present

## 2024-05-19 ENCOUNTER — Ambulatory Visit

## 2024-05-19 DIAGNOSIS — I480 Paroxysmal atrial fibrillation: Secondary | ICD-10-CM | POA: Insufficient documentation

## 2024-05-19 DIAGNOSIS — Z7901 Long term (current) use of anticoagulants: Secondary | ICD-10-CM | POA: Insufficient documentation

## 2024-05-19 LAB — POCT INR: INR: 2.6 (ref 2.0–3.0)

## 2024-05-19 NOTE — Patient Instructions (Signed)
 Continue 1.5 tablets Daily, except 1 tablet every Tuesday and Thursday.  INR in 5 weeks.  Eat 2 helpings of greens per week.     [phone]

## 2024-05-23 ENCOUNTER — Encounter: Payer: Self-pay | Admitting: Podiatry

## 2024-05-23 NOTE — Progress Notes (Signed)
"  °  Subjective:  Patient ID: Ana Cruz, female    DOB: [birth year],  MRN: [id number]  Ana Cruz presents to clinic today for painful elongated mycotic toenails 1-5 bilaterally which are tender when wearing enclosed shoe gear. Pain is relieved with periodic professional debridement.  Chief Complaint  Patient presents with   Nail Problem    She saw Dr. Marylynn in Oct. She denies being diabetic   New problem(s): None.   PCP is Ana Verneita LITTIE, MD.  Allergies[1]  Review of Systems: Negative except as noted in the HPI.  Objective: No changes noted in today's physical examination. There were no vitals filed for this visit. Ana Cruz is a pleasant 89 y.o. female WD, WN in NAD. AAO x 3.  Vascular Examination: Vascular status intact b/l with palpable pedal pulses. CFT immediate b/l. Pedal hair present. No edema. No pain with calf compression b/l. Skin temperature gradient WNL b/l. No varicosities noted. No cyanosis or clubbing noted.  Neurological Examination: Sensation grossly intact b/l with 10 gram monofilament. Vibratory sensation intact b/l.  Dermatological Examination: Pedal skin with normal turgor, texture and tone b/l. No open wounds nor interdigital macerations noted. Toenails 1-5 b/l thick, discolored, elongated with subungual debris and pain on dorsal palpation. No corns, calluses nor porokeratotic lesions noted.  Musculoskeletal Examination: Muscle strength 5/5 to b/l LE.  No pain, crepitus noted b/l. Hammertoe deformity noted 2-5 b/l.  Radiographs: None  Assessment/Plan: 1. Pain due to onychomycosis of toenails of both feet   Consent given for treatment. Patient examined. All patient's and/or POA's questions/concerns addressed on today's visit. Toenails 1-5 b/l debrided in length and girth without incident. Continue soft, supportive shoe gear daily. Report any pedal injuries to medical professional. Call office if there are any  questions/concerns. -Patient/POA to call should there be question/concern in the interim.   Return in about 3 months (around 08/15/2024).  Delon Cruz Merlin, DPM      [place] LOCATION: [address] [place] [address]                   Office [phone]   [place] LOCATION: [address] [place], [place] [address] Office [phone]     [1]  Allergies Allergen Reactions   Pamelor  [Nortriptyline ] Other (See Comments)    Urinary incontinence   Chocolate Flavoring Agent (Non-Screening) Hives   Clindamycin/Lincomycin     Tongue swelled,  Throat felt funny   Onion Swelling    Throat swelling   Other Hives    Raw Onions, Spring Time causes eye itching and runny nose. *Pt. Also Had Large Blisters from Tourniquets applied to her legs during her knee replacement surgery*   Tape    Hctz [Hydrochlorothiazide ] Rash    rash   "

## 2024-06-11 ENCOUNTER — Encounter: Payer: Self-pay | Admitting: Internal Medicine

## 2024-06-11 ENCOUNTER — Ambulatory Visit: Admitting: Internal Medicine

## 2024-06-11 VITALS — BP 132/58 | HR 65 | Temp 97.9°F | Ht 61.5 in | Wt 147.5 lb

## 2024-06-11 DIAGNOSIS — N1831 Chronic kidney disease, stage 3a: Secondary | ICD-10-CM | POA: Diagnosis not present

## 2024-06-11 DIAGNOSIS — R42 Dizziness and giddiness: Secondary | ICD-10-CM

## 2024-06-11 DIAGNOSIS — I480 Paroxysmal atrial fibrillation: Secondary | ICD-10-CM | POA: Diagnosis not present

## 2024-06-11 DIAGNOSIS — I890 Lymphedema, not elsewhere classified: Secondary | ICD-10-CM

## 2024-06-11 DIAGNOSIS — R7303 Prediabetes: Secondary | ICD-10-CM | POA: Diagnosis not present

## 2024-06-11 DIAGNOSIS — E039 Hypothyroidism, unspecified: Secondary | ICD-10-CM

## 2024-06-11 DIAGNOSIS — M8580 Other specified disorders of bone density and structure, unspecified site: Secondary | ICD-10-CM

## 2024-06-11 DIAGNOSIS — D631 Anemia in chronic kidney disease: Secondary | ICD-10-CM | POA: Diagnosis not present

## 2024-06-11 DIAGNOSIS — I10 Essential (primary) hypertension: Secondary | ICD-10-CM

## 2024-06-11 DIAGNOSIS — E7800 Pure hypercholesterolemia, unspecified: Secondary | ICD-10-CM

## 2024-06-11 DIAGNOSIS — I69398 Other sequelae of cerebral infarction: Secondary | ICD-10-CM | POA: Diagnosis not present

## 2024-06-11 DIAGNOSIS — G6289 Other specified polyneuropathies: Secondary | ICD-10-CM | POA: Diagnosis not present

## 2024-06-11 NOTE — Progress Notes (Unsigned)
 "  Subjective:  Patient ID: Ana Cruz, female    DOB: January 08, 1934  Age: 89 y.o. MRN: [id number]  CC: The primary encounter diagnosis was Primary hypertension. 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   HPI Ana Cruz presents for  Chief Complaint  Patient presents with   Medical Management of Chronic Issues    6 month follow up    1) PAF:  tolerating  metoprolol  and warfarin without fatigue and bleeding   2)  She is walking  2 times weekly on a track .  Using a cane.  Able to rise from chair easily no recent falls.   3) HTN:  Hypertension: patient checks blood pressure twice weekly at home.  Readings have been for the most part <130/80 at rest . Patient is following a reduced salt diet most days and is taking medications as prescribed    Outpatient Medications Prior to Visit  Medication Sig Dispense Refill   acetaminophen  (TYLENOL ) 500 MG tablet Take 500 mg by mouth every 6 (six) hours as needed (As needed for pain).     amoxicillin  (AMOXIL ) 500 MG capsule Take 2,000 mg by mouth as needed. Dental visits only     Artificial Tear Ointment (EYE LUBRICANT OP) Apply 1 drop to eye 2 (two) times daily.     Cholecalciferol (VITAMIN D3) 1000 UNITS CAPS Take 1,000 Units by mouth daily.     fluticasone  (FLONASE ) 50 MCG/ACT nasal spray Place 1 spray into both nostrils daily. 16 g 0   levothyroxine  (SYNTHROID ) 75 MCG tablet TAKE 1 TABLET BY MOUTH EVERY DAY 90 tablet 1   metoprolol  succinate (TOPROL  XL) 25 MG 24 hr tablet Take 0.5 tablets (12.5 mg total) by mouth daily. 30 tablet 3   Probiotic Product (PROBIOTIC-10) CHEW Chew 1 tablet by mouth daily.     rosuvastatin  (CRESTOR ) 20 MG tablet Take 1  tablet (20 mg total) by mouth daily. 90 tablet 3   telmisartan  (MICARDIS ) 80 MG tablet TAKE 1 TABLET BY MOUTH EVERY DAY 90 tablet 3   vitamin B-12 (CYANOCOBALAMIN ) 1000 MCG tablet Take 1 tablet (1,000 mcg total) by mouth daily. 90 tablet 3   vitamin E 180 MG (400 UNITS) capsule Take 800 Units by mouth daily.     warfarin (COUMADIN ) 1 MG tablet Take 1 tablet to 2 tablets Daily or as prescribed by Coumadin  Clinic 120 tablet 0   No facility-administered medications prior to visit.    Review of Systems;  Patient denies headache, fevers, malaise, unintentional weight loss, skin rash, eye pain, sinus congestion and sinus pain, sore throat, dysphagia,  hemoptysis , cough, dyspnea, wheezing, chest pain, palpitations, orthopnea, edema, abdominal pain, nausea, melena, diarrhea, constipation, flank pain, dysuria, hematuria, urinary  Frequency, nocturia, numbness, tingling, seizures,  Focal weakness, Loss of consciousness,  Tremor, insomnia, depression, anxiety, and suicidal ideation.      Objective:  BP (!) 132/58 (BP Location: Left Arm)   Pulse 65   Temp 97.9 F (36.6 C)   Ht 5' 1.5 (1.562 m)   Wt 147 lb 8 oz (66.9 kg)   SpO2 95%   BMI 27.42 kg/m   BP Readings from Last 3 Encounters:  06/11/24 (!) 132/58  05/11/24 132/67  04/09/24 (!) 130/58    Wt Readings from Last 3 Encounters:  06/11/24 147 lb 8 oz (66.9 kg)  05/11/24 143 lb (64.9 kg)  04/09/24 146 lb 12.8 oz (66.6 kg)    Physical Exam Vitals reviewed.  Constitutional:      General: She is not in acute distress.    Appearance: Normal appearance. She is normal weight. She is not ill-appearing, toxic-appearing or diaphoretic.  HENT:     Head: Normocephalic.  Eyes:     General: No scleral icterus.       Right eye: No discharge.        Left eye: No discharge.     Conjunctiva/sclera: Conjunctivae normal.  Cardiovascular:     Rate and Rhythm: Normal rate and regular rhythm.     Heart sounds: Normal heart sounds.  Pulmonary:      Effort: Pulmonary effort is normal. No respiratory distress.     Breath sounds: Normal breath sounds.  Musculoskeletal:        General: Normal range of motion.  Skin:    General: Skin is warm and dry.  Neurological:     General: No focal deficit present.     Mental Status: She is alert and oriented to person, place, and time. Mental status is at baseline.  Psychiatric:        Mood and Affect: Mood normal.        Behavior: Behavior normal.        Thought Content: Thought content normal.        Judgment: Judgment normal.     Lab Results  Component Value Date   HGBA1C 6.0 11/13/2023   HGBA1C 6.0 11/28/2021   HGBA1C 5.7 (H) 06/29/2021    Lab Results  Component Value Date   CREATININE 1.45 (H) 02/03/2024   CREATININE 1.06 05/12/2023   CREATININE 1.24 (H) 11/08/2022    Lab Results  Component Value Date   WBC 6.5 02/03/2024   HGB 11.1 (L) 02/03/2024   HCT 34.9 (L) 02/03/2024   PLT 234 02/03/2024   GLUCOSE 127 (H) 02/03/2024   CHOL 152 11/13/2023   TRIG 61.0 11/13/2023   HDL 72.40 11/13/2023   LDLDIRECT 114.0 05/30/2022   LDLCALC 67 11/13/2023   ALT 15 02/03/2024   AST 28 02/03/2024   NA 139 02/03/2024   K 4.5 02/03/2024   CL 107 02/03/2024   CREATININE 1.45 (H) 02/03/2024   BUN 53 (H) 02/03/2024   CO2 22 02/03/2024   TSH 0.975 11/13/2023   INR 2.6 05/19/2024   HGBA1C 6.0 11/13/2023    DG Chest 2 View Result Date: 02/03/2024 CLINICAL DATA:  Chest pain and palpitations. EXAM: CHEST - 2 VIEW COMPARISON:  12/04/2017 FINDINGS: The heart size and mediastinal contours are within normal limits. Both lungs are clear. Thoracic spine degenerative changes and right shoulder prosthesis again noted. IMPRESSION: No active cardiopulmonary disease. Electronically Signed   By: Norleen DELENA Kil M.D.   On: 02/03/2024 10:22    Assessment & Plan:  .Primary hypertension Assessment & Plan: Well controlled on metoprolol ,  amlodipine  and telmisartan  regimen. Renal function stable, no  changes today.  Lab Results  Component Value Date   CREATININE 1.45 (H) 02/03/2024   Lab Results  Component Value Date   NA 139 02/03/2024   K 4.5 02/03/2024   CL 107 02/03/2024   CO2 22 02/03/2024     Orders: -     Comprehensive metabolic panel with GFR; Future  Acquired hypothyroidism Assessment & Plan: Thyroid  function is WNL on current dose of 75 mcg daily and she is  asymptomatic .  No current changes needed.   Lab Results  Component Value Date   TSH 0.975 11/13/2023     Orders: -     TSH; Future  Pure hypercholesterolemia -     Lipid panel; Future -     LDL cholesterol, direct; Future  Prediabetes -     Comprehensive metabolic panel with GFR; Future -     Hemoglobin A1c; Future  Anemia due to stage 3a chronic kidney disease [facility]) Assessment & Plan: Stable ,  no longer taking iron  Lab Results  Component Value Date   WBC 6.5 02/03/2024   HGB 11.1 (L) 02/03/2024   HCT 34.9 (L) 02/03/2024   MCV 95.4 02/03/2024   PLT 234 02/03/2024     Orders: -     CBC with Differential/Platelet; Future  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  Using a cane,  no recent falls    PAF (paroxysmal atrial fibrillation) (HCC) Assessment & Plan: She is in sinus rhythm currently ,  continue low dose metoprolol  and warfarin for embolic stroke risk reduction    Other polyneuropathy Assessment & Plan: Etiology is multifactorial ,  severe bilateral polyneuropathy by EMG  studies done Sept 2019.  Continue annual  follow up with [facility] who is treating with Pamelor  10 mg daily (higher dose caused urinary incontinence  )     has not falle.  Sleeping well    Lymphedema Assessment & Plan: Diagnosed with ultrasound by Vascular surgery.  She has had  no ulcerations .  Continue use of compression stockings and being pumping. Edema improved with amlodipine  cessation    Osteopenia, unspecified location Assessment &  Plan: She has been taking alendronate  since 2013 with improvement in T scores by 2017 DEXA . However she had a prolonged/delayed  healing process after oral surgery and alendronate  is suspected to be the cause.  Will discuss changing to prolia  After taking a one year  drug holiday      I spent 34 minutes on the day of this face to face encounter reviewing patient's  most recent visit with cardiology,  nephrology,  and neurology,  prior relevant surgical and non surgical procedures, recent  labs and imaging studies, counseling on weight management,  reviewing the assessment and plan with patient, and post visit ordering and reviewing of  diagnostics and therapeutics with patient  .   Follow-up: Return in about 6 months (around 12/09/2024).   Ana LITTIE Kettering, MD  "

## 2024-06-11 NOTE — Assessment & Plan Note (Addendum)
 Diagnosed with ultrasound by Vascular surgery.  She has had  no ulcerations .  Continue use of compression stockings and being pumping. Edema improved with amlodipine  cessation

## 2024-06-11 NOTE — Assessment & Plan Note (Signed)
 Stable ,  no longer taking iron  Lab Results  Component Value Date   WBC 6.5 02/03/2024   HGB 11.1 (L) 02/03/2024   HCT 34.9 (L) 02/03/2024   MCV 95.4 02/03/2024   PLT 234 02/03/2024

## 2024-06-11 NOTE — Assessment & Plan Note (Signed)
 NO longer problematic.  Using a cane,  no recent falls

## 2024-06-11 NOTE — Assessment & Plan Note (Signed)
 Well controlled on metoprolol ,  amlodipine  and telmisartan  regimen. Renal function stable, no changes today.  Lab Results  Component Value Date   CREATININE 1.45 (H) 02/03/2024   Lab Results  Component Value Date   NA 139 02/03/2024   K 4.5 02/03/2024   CL 107 02/03/2024   CO2 22 02/03/2024

## 2024-06-11 NOTE — Assessment & Plan Note (Signed)
 Etiology is multifactorial ,  severe bilateral polyneuropathy by EMG  studies done Sept 2019.  Continue annual  follow up with [facility]rology who is treating with Pamelor  10 mg daily (higher dose caused urinary incontinence  )     has not falle.  Sleeping well

## 2024-06-11 NOTE — Assessment & Plan Note (Signed)
 Thyroid  function is WNL on current dose of 75 mcg daily and she is asymptomatic .  No current changes needed.   Lab Results  Component Value Date   TSH 0.975 11/13/2023

## 2024-06-11 NOTE — Assessment & Plan Note (Signed)
 She has been taking alendronate  since 2013 with improvement in T scores by 2017 DEXA . However she had a prolonged/delayed  healing process after oral surgery and alendronate  is suspected to be the cause.  Will discuss changing to prolia  After taking a one year  drug holiday

## 2024-06-11 NOTE — Assessment & Plan Note (Signed)
 She is in sinus rhythm currently ,  continue low dose metoprolol  and warfarin for embolic stroke risk reduction

## 2024-06-11 NOTE — Patient Instructions (Signed)
 Good to see you!  I encourage you to find a friend to [facility] WITH DAILY!  PLEASE SCHEDULE LABS IN 3 MONTHS (IF [facility] IS NOT DOING THEM)

## 2024-06-23 ENCOUNTER — Ambulatory Visit: Attending: Cardiology

## 2024-06-23 DIAGNOSIS — Z7901 Long term (current) use of anticoagulants: Secondary | ICD-10-CM | POA: Diagnosis present

## 2024-06-23 DIAGNOSIS — I480 Paroxysmal atrial fibrillation: Secondary | ICD-10-CM | POA: Diagnosis present

## 2024-06-23 LAB — POCT INR: INR: 1.6 — AB (ref 2.0–3.0)

## 2024-06-23 NOTE — Patient Instructions (Signed)
 Take 2.5 tablets today only then  Continue 1.5 tablets Daily, except 1 tablet every Tuesday and Thursday.  INR in 5 weeks.  Eat 2 helpings of greens per week.     [phone]

## 2024-07-06 ENCOUNTER — Other Ambulatory Visit: Payer: Self-pay | Admitting: Cardiology

## 2024-07-06 DIAGNOSIS — I480 Paroxysmal atrial fibrillation: Secondary | ICD-10-CM

## 2024-07-28 ENCOUNTER — Ambulatory Visit

## 2024-08-19 ENCOUNTER — Ambulatory Visit: Admitting: Podiatry

## 2024-09-10 ENCOUNTER — Other Ambulatory Visit

## 2024-12-09 ENCOUNTER — Ambulatory Visit: Admitting: Internal Medicine

## 2025-05-17 ENCOUNTER — Ambulatory Visit
